# Patient Record
Sex: Male | Born: 1970
Health system: Southern US, Community
[De-identification: ages and names within clinical notes are randomized; demographics above are authoritative.]

## PROBLEM LIST (undated history)

## (undated) DIAGNOSIS — D649 Anemia, unspecified: Secondary | ICD-10-CM

## (undated) DIAGNOSIS — Z87442 Personal history of urinary calculi: Secondary | ICD-10-CM

## (undated) DIAGNOSIS — G4730 Sleep apnea, unspecified: Secondary | ICD-10-CM

## (undated) DIAGNOSIS — N189 Chronic kidney disease, unspecified: Secondary | ICD-10-CM

## (undated) DIAGNOSIS — F419 Anxiety disorder, unspecified: Secondary | ICD-10-CM

## (undated) DIAGNOSIS — K219 Gastro-esophageal reflux disease without esophagitis: Secondary | ICD-10-CM

## (undated) DIAGNOSIS — I10 Essential (primary) hypertension: Secondary | ICD-10-CM

## (undated) DIAGNOSIS — C801 Malignant (primary) neoplasm, unspecified: Secondary | ICD-10-CM

## (undated) DIAGNOSIS — E785 Hyperlipidemia, unspecified: Secondary | ICD-10-CM

## (undated) HISTORY — DX: Essential (primary) hypertension: I10

## (undated) HISTORY — DX: Hyperlipidemia, unspecified: E78.5

## (undated) HISTORY — PX: NO PAST SURGERIES: SHX2092

## (undated) HISTORY — DX: Anemia, unspecified: D64.9

## (undated) HISTORY — PX: WISDOM TOOTH EXTRACTION: SHX21

---

## 2009-08-15 ENCOUNTER — Ambulatory Visit: Payer: Self-pay | Admitting: Family Medicine

## 2009-08-16 ENCOUNTER — Telehealth (INDEPENDENT_AMBULATORY_CARE_PROVIDER_SITE_OTHER): Payer: Self-pay | Admitting: *Deleted

## 2009-08-16 LAB — CONVERTED CEMR LAB
AST: 28 units/L (ref 0–37)
Albumin: 4.6 g/dL (ref 3.5–5.2)
BUN: 14 mg/dL (ref 6–23)
Basophils Absolute: 0 10*3/uL (ref 0.0–0.1)
Basophils Relative: 0.4 % (ref 0.0–3.0)
Calcium: 9.4 mg/dL (ref 8.4–10.5)
Chloride: 107 meq/L (ref 96–112)
Direct LDL: 198.2 mg/dL
Eosinophils Absolute: 0.1 10*3/uL (ref 0.0–0.7)
Eosinophils Relative: 1.6 % (ref 0.0–5.0)
GFR calc non Af Amer: 105.09 mL/min (ref >60)
MCHC: 34.6 g/dL (ref 30.0–36.0)
MCV: 85 fL (ref 78.0–100.0)
Monocytes Absolute: 0.6 10*3/uL (ref 0.1–1.0)
Monocytes Relative: 8.3 % (ref 3.0–12.0)
Neutro Abs: 3.8 10*3/uL (ref 1.4–7.7)
Sodium: 143 meq/L (ref 135–145)
VLDL: 35.6 mg/dL (ref 0.0–40.0)

## 2009-08-26 ENCOUNTER — Ambulatory Visit: Payer: Self-pay | Admitting: Internal Medicine

## 2009-08-26 DIAGNOSIS — D239 Other benign neoplasm of skin, unspecified: Secondary | ICD-10-CM | POA: Insufficient documentation

## 2009-08-26 DIAGNOSIS — R079 Chest pain, unspecified: Secondary | ICD-10-CM | POA: Insufficient documentation

## 2009-08-26 DIAGNOSIS — E785 Hyperlipidemia, unspecified: Secondary | ICD-10-CM | POA: Insufficient documentation

## 2009-08-26 LAB — CONVERTED CEMR LAB
Bilirubin Urine: NEGATIVE
Specific Gravity, Urine: <1.005
WBC Urine, dipstick: NEGATIVE
pH: 7.5

## 2009-09-02 ENCOUNTER — Telehealth: Payer: Self-pay | Admitting: Internal Medicine

## 2009-10-03 ENCOUNTER — Ambulatory Visit: Payer: Self-pay | Admitting: Family Medicine

## 2010-03-25 ENCOUNTER — Encounter (INDEPENDENT_AMBULATORY_CARE_PROVIDER_SITE_OTHER): Payer: Self-pay | Admitting: *Deleted

## 2010-04-11 ENCOUNTER — Telehealth: Payer: Self-pay | Admitting: Internal Medicine

## 2010-04-11 ENCOUNTER — Ambulatory Visit
Admission: RE | Admit: 2010-04-11 | Discharge: 2010-04-11 | Payer: Self-pay | Source: Home / Self Care | Attending: Internal Medicine | Admitting: Internal Medicine

## 2010-04-11 ENCOUNTER — Encounter (INDEPENDENT_AMBULATORY_CARE_PROVIDER_SITE_OTHER): Payer: Self-pay | Admitting: *Deleted

## 2010-04-15 NOTE — Assessment & Plan Note (Signed)
Summary: pain under left rib/difficult to breathe//kn   Vital Signs:  Patient profile:   40 year old male Height:      68.50 inches Weight:      196 pounds O2 Sat:      98 % on Room air Temp:     98.8 degrees F oral Pulse rate:   67 / minute BP sitting:   110 / 68  (left arm)  Vitals Entered By: Jeremy Johann CMA (August 26, 2009 11:23 AM)  O2 Flow:  Room air CC: sharp stabbing pain left arm and side Comments --difficulty breathing x 1day REVIEWED MED LIST, PATIENT AGREED DOSE AND INSTRUCTION CORRECT    History of Present Illness: sudden onset of left-sided chest pain, located in the lateral, distal left  chest. The pain is dull but very sharp if he tries to take a deep breath Has not taken any medication for these Denies any injury except for the fact that he painted some walls during the weekend. he did start taking Crestor few  days ago.  Incidentally, I noted some dark moles in his back, he never has seen dermatology   ROS No fever or rash anywhere No cough, no shortness of breath per se No abdominal pain or GERD-type symptoms No recent airplane trip (last was 3 weeks ago); no swelling or pain in his legs No myalgias  Allergies (verified): No Known Drug Allergies  Past History:  Past Medical History: none Hyperlipidemia  Past Surgical History: Reviewed history from 08/15/2009 and no changes required. none  Social History: married twin girls Producer, television/film/video tobacco-- quit 2009  Review of Systems      See HPI Derm:  denies any rash in the back.  Physical Exam  General:  alert, well-developed, and well-nourished.  no apparent distress Chest Wall:  slightly tender at the distal left rib cage. No crepitus anywhere in his chest Lungs:  clear to auscultation bilaterally Heart:  her current and rhythm without murmur Abdomen:  soft, non-tender, no distention, no masses, no guarding, no rigidity, no hepatomegaly, and no splenomegaly.     Extremities:  no edema, calves symmetric and nontender Skin:  few dark moles in his back   Impression & Recommendations:  Problem # 1:  RIB PAIN, LEFT SIDED (ICD-786.50) Assessment New Pleuritic pain in the left side. DDX is large but includes pleurisy, chest wall sprain, early shingles, versus other  more serious (but less likely) diagnoses Plan: Chest x-ray Ibuprofen Patient will call if not better, or if symptoms appear. Will need further workup. Chest CT? Will also consider discontinue Crestor Orders: UA Dipstick w/o Micro (manual) ([address]) T-2 View CXR (71020TC)  Problem # 2:  MOLE (ICD-216.9) Assessment: New recommend to seea dermatologist, likes to make his own appointment  Complete Medication List: 1)  Crestor 20 Mg Tabs (Rosuvastatin calcium) .... Take one tablet at bedtime  Patient Instructions: 1)  call if not better in a few days, call anytime if worse  2)  please make an appointment to see a dermatologist, Dr. Terri Piedra [phone] 3)  Chest x-ray 4)  Take  600 mg of Ibuprofen (Advil, Motrin) with food every  6 hours as needed  for relief of pain ; watch for stomach irritation  Laboratory Results   Urine Tests   Date/Time Reported: August 26, 2009 11:40 AM  Routine Urinalysis   Color: yellow Appearance: Clear Glucose: negative   (Normal Range: Negative) Bilirubin: negative   (Normal Range: Negative) Ketone: negative   (  Normal Range: Negative) Spec. Gravity: <1.005   (Normal Range: 1.003-1.035) Blood: negative   (Normal Range: Negative) pH: 7.5   (Normal Range: 5.0-8.0) Protein: negative   (Normal Range: Negative) Urobilinogen: 0.2   (Normal Range: 0-1) Nitrite: negative   (Normal Range: Negative) Leukocyte Esterace: negative   (Normal Range: Negative)

## 2010-04-15 NOTE — Assessment & Plan Note (Signed)
Summary: possible strep//kn   Vital Signs:  Patient profile:   40 year old male Height:      68.50 inches (173.99 cm) Weight:      196.13 pounds (89.15 kg) BMI:     29.49 O2 Sat:      98 % on Room air Temp:     99.0 degrees F (37.22 degrees C) oral Pulse rate:   85 / minute BP sitting:   132 / 92  (right arm) Cuff size:   regular  Vitals Entered By: Lucious Groves CMA (October 03, 2009 11:58 AM)  O2 Flow:  Room air CC: Possible strep-Sore throat x4 days./kb Is Patient Diabetic? No Pain Assessment Patient in pain? no      Comments Patient notes that his throat is sore to the touch and feels like "a ping pong ball is stuck" when he swallows. Pt has had fever, body aches, and chils./kb   History of Present Illness: 40 yo man here today for possible strep.  wife recently had strep.  sxs started 3-4 days ago.  + fevers, chills, body aches.  + sore throat.  Current Medications (verified): 1)  Crestor 20 Mg Tabs (Rosuvastatin Calcium) .... Take One Tablet At Bedtime  Allergies (verified): No Known Drug Allergies  Review of Systems      See HPI  Physical Exam  General:  alert, well-developed, and well-nourished.  no apparent distress Nose:  External nasal examination shows no deformity or inflammation. Nasal mucosa are pink and moist without lesions or exudates. Mouth:  pharyngeal erythema and pharyngeal exudate.   Neck:  No deformities, masses, or tenderness noted.   Impression & Recommendations:  Problem # 1:  PHARYNGITIS-ACUTE (ICD-462) Assessment New  rapid strep +, start amox.  reviewed supportive care and red flags that should prompt return.  Pt expresses understanding and is in agreement w/ this plan. His updated medication list for this problem includes:    Amoxicillin 500 Mg Tabs (Amoxicillin) .Marland Kitchen... 1 two times a day x10 days  Orders: Rapid Strep ([address])  Complete Medication List: 1)  Crestor 20 Mg Tabs (Rosuvastatin calcium) .... Take one tablet at bedtime 2)   Amoxicillin 500 Mg Tabs (Amoxicillin) .Marland Kitchen.. 1 two times a day x10 days  Patient Instructions: 1)  You have strep 2)  Take the Amoxicillin as directed- take w/ food to avoid upset stomach 3)  Ibuprofen as needed for pain or fever 4)  Hang in there! Prescriptions: AMOXICILLIN 500 MG TABS (AMOXICILLIN) 1 two times a day x10 days  #20 x 0   Entered and Authorized by:   Neena Rhymes MD   Signed by:   Neena Rhymes MD on 10/03/2009   Method used:   Electronically to        [id number] (ret[facility]       [address]       [place], [place]  [address]       Ph: [phone] or [phone]       Fax: [phone]   RxID:   [id number]

## 2010-04-15 NOTE — Progress Notes (Signed)
Summary: labs  Phone Note Outgoing Call   Call placed by: Doristine Devoid,  August 16, 2009 2:25 PM Call placed to: Patient Summary of Call: pt's total cholesterol and LDL are very high.  needs to start Crestor 20mg  at bedtime, focus on diet and exercise, and recheck LFTs in 6-8 weeks.   Follow-up for Phone Call        left msg on machine .............Marland KitchenDoristine Devoid  August 16, 2009 2:25 PM   spoke w/ patient aware of labs and start of medication mailed copy of labs .......Marland KitchenDoristine Devoid  August 16, 2009 4:20 PM     New/Updated Medications: CRESTOR 20 MG TABS (ROSUVASTATIN CALCIUM) take one tablet at bedtime Prescriptions: CRESTOR 20 MG TABS (ROSUVASTATIN CALCIUM) take one tablet at bedtime  #30 x 3   Entered by:   Doristine Devoid   Authorized by:   Neena Rhymes MD   Signed by:   Doristine Devoid on 08/16/2009   Method used:   Electronically to        [id number] (ret[facility]       [address]       [place], [place]  [address]       Ph: [phone] or [phone]       Fax: [phone]   RxID:   [id number]

## 2010-04-15 NOTE — Assessment & Plan Note (Signed)
Summary: NEW TO EST/CBS   Vital Signs:  Patient profile:   40 year old male Height:      68.50 inches Weight:      196 pounds BMI:     29.47 Pulse rate:   78 / minute BP sitting:   114 / 80  (left arm)  Vitals Entered By: Doristine Devoid (August 15, 2009 10:32 AM) CC: NEW EST- CPX AND LABS   History of Present Illness: 40 yo man here today to establish care.  Previous MD- Kindl.  no concerns.    Preventive Screening-Counseling & Management  Alcohol-Tobacco     Alcohol drinks/day: <1     Smoking Status: quit     Year Quit: 2008  Caffeine-Diet-Exercise     Does Patient Exercise: no      Sexual History:  currently monogamous.        Drug Use:  never.    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    Physical Exam  General:  Well-developed,well-nourished,in no acute distress; alert,appropriate and cooperative throughout examination Head:  Normocephalic and atraumatic without obvious abnormalities. No apparent alopecia or balding. Eyes:  No corneal or conjunctival inflammation noted. EOMI. Perrla. Funduscopic exam benign, without hemorrhages, exudates or papilledema. Vision  grossly normal. Ears:  External ear exam shows no significant lesions or deformities.  Otoscopic examination reveals clear canals, tympanic membranes are intact bilaterally without bulging, retraction, inflammation or discharge. Hearing is grossly normal bilaterally. Nose:  External nasal examination shows no deformity or inflammation. Nasal mucosa are pink and moist without lesions or exudates. Mouth:  Oral mucosa and oropharynx without lesions or exudates.  Teeth in good repair. Neck:  No deformities, masses, or tenderness noted. Lungs:  Normal respiratory effort, chest expands symmetrically. Lungs are clear to auscultation, no crackles or wheezes. Heart:  Normal rate and regular rhythm. S1 and S2 normal without gallop, murmur, click, rub or other extra sounds. Abdomen:  Bowel sounds positive,abdomen soft and non-tender without masses, organomegaly or hernias noted. Genitalia:  Testes bilaterally descended without nodularity, tenderness or masses. No scrotal masses or lesions. No penis lesions or urethral discharge. Msk:  No deformity or scoliosis noted of thoracic or lumbar spine.   Pulses:  +2 carotid, radial, DP Extremities:  No clubbing, cyanosis, edema, or deformity noted with normal full range of motion of all joints.   Neurologic:  No cranial nerve deficits noted. Station and gait are normal. Plantar reflexes are down-going bilaterally. DTRs are symmetrical throughout. Sensory, motor and coordinative functions appear intact. Skin:  Intact without suspicious lesions or rashes Cervical Nodes:  No lymphadenopathy noted Inguinal Nodes:  No significant adenopathy Psych:  Cognition and judgment appear intact. Alert and cooperative with normal attention span and concentration. No apparent delusions, illusions, hallucinations   Impression & Recommendations:  Problem # 1:  PHYSICAL EXAMINATION (ICD-V70.0) Assessment New PE  WNL.  labs collected.  anticipatory guidance  provided. Orders: Venipuncture ([address]) TLB-Lipid Panel (80061-LIPID) TLB-BMP (Basic Metabolic Panel-BMET) (80048-METABOL) TLB-CBC Platelet - w/Differential (85025-CBCD) TLB-Hepatic/Liver Function Pnl (80076-HEPATIC) TLB-TSH (Thyroid Stimulating Hormone) ([address]-UJW)  Patient Instructions: 1)  Please schedule a follow-up appointment in 1 year or as needed. 2)  Your exam looks great! 3)  Try and get some regular exercise 4)  We'll notify you of your lab work 5)  Call with any questions or concerns 6)  Welcome!  We're glad to have you!

## 2010-04-15 NOTE — Progress Notes (Signed)
Summary: Rosita Fire pt status  ---- Converted from flag ---- ---- 08/26/2009 4:41 PM, Jose E. Paz MD wrote: please check on him, better? ------------------------------  LEF MESSAGE TO CALL OFFICE.Felecia Deloach CMA  September 02, 2009 8:27 AM   PT STATES THAT HE IS DOING MUCH BETTER NOW THE PAIN SUBSIDED AS THE WEEK PROGRESSED. Felecia Deloach CMA  September 03, 2009 10:54 AM

## 2010-04-17 NOTE — Letter (Signed)
Summary: Pre Visit Letter Revised  [facility]  [address] [place], [place] [address]   Phone: [phone]  Fax: [phone]        03/25/2010 MRN: [id number] [facility] [address] [place], [place]  [address]             Procedure Date:  04-25-10   Welcome to the Gastroenterology Division at [facility].    You are scheduled to see a nurse for your pre-procedure visit on 04-11-09 at 8:00a.m. on the 3rd floor at [facility], [address]  We ask that you try to arrive at our office 15 minutes prior to your appointment time to allow for check-in.  Please take a minute to review the attached form.  If you answer "Yes" to one or more of the questions on the first page, we ask that you call the person listed at your earliest opportunity.  If you answer "No" to all of the questions, please complete the rest of the form and bring it to your appointment.    Your nurse visit will consist of discussing your medical and surgical history, your immediate family medical history, and your medications.   If you are unable to list all of your medications on the form, please bring the medication bottles to your appointment and we will list them.  We will need to be aware of both prescribed and over the counter drugs.  We will need to know exact dosage information as well.    Please be prepared to read and sign documents such as consent forms, a financial agreement, and acknowledgement forms.  If necessary, and with your consent, a friend or relative is welcome to sit-in on the nurse visit with you.  Please bring your insurance card so that we may make a copy of it.  If your insurance requires a referral to see a specialist, please bring your referral form from your primary care physician.  No co-pay is required for this nurse visit.     If you cannot keep your appointment, please call [phone] to cancel or reschedule prior to your appointment date.  This allows [place]  the opportunity to schedule an appointment for another patient in need of care.    Thank you for choosing [facility] for your medical needs.  We appreciate the opportunity to care for you.  Please visit [place] at our website  to learn more about our practice.  Sincerely, The Gastroenterology Division

## 2010-04-17 NOTE — Letter (Signed)
Summary: [facility] Instructions  [facility]  [address] [place], [place] [address]   Phone: [phone]  Fax: [phone]       Dakota Reid    1970/09/22    MRN: [id number]        Procedure Day /Date: Friday 04-25-10     Arrival Time: 10:30 am      Procedure Time: 11:30 am     Location of Procedure:                    _x _   [facility] (4th Floor)                      PREPARATION FOR COLONOSCOPY WITH MOVIPREP   Starting 5 days prior to your procedure  04-20-10  do not eat nuts, seeds, popcorn, corn, beans, peas,  salads, or any raw vegetables.  Do not take any fiber supplements (e.g. Metamucil, Citrucel, and Benefiber).  THE DAY BEFORE YOUR PROCEDURE         DATE: 04-24-10  DAY: Thursday   1.  Drink clear liquids the entire day-NO SOLID FOOD  2.  Do not drink anything colored red or purple.  Avoid juices with pulp.  No orange juice.  3.  Drink at least 64 oz. (8 glasses) of fluid/clear liquids during the day to prevent dehydration and help the prep work efficiently.  CLEAR LIQUIDS INCLUDE: Water Jello Ice Popsicles Tea (sugar ok, no milk/cream) Powdered fruit flavored drinks Coffee (sugar ok, no milk/cream) Gatorade Juice: apple, white grape, white cranberry  Lemonade Clear bullion, consomm, broth Carbonated beverages (any kind) Strained chicken noodle soup Hard Candy                             4.  In the morning, mix first dose of MoviPrep solution:    Empty 1 Pouch A and 1 Pouch B into the disposable container    Add lukewarm drinking water to the top line of the container. Mix to dissolve    Refrigerate (mixed solution should be used within 24 hrs)  5.  Begin drinking the prep at 5:00 p.m. The MoviPrep container is divided by 4 marks.   Every 15 minutes drink the solution down to the next mark (approximately 8 oz) until the full liter is complete.   6.  Follow completed prep with 16 oz of clear liquid of your choice  (Nothing red or purple).  Continue to drink clear liquids until bedtime.  7.  Before going to bed, mix second dose of MoviPrep solution:    Empty 1 Pouch A and 1 Pouch B into the disposable container    Add lukewarm drinking water to the top line of the container. Mix to dissolve    Refrigerate  THE DAY OF YOUR PROCEDURE      DATE:  04-25-10  DAY:  Friday  Beginning at  6:30 a.m. (5 hours before procedure):         1. Every 15 minutes, drink the solution down to the next mark (approx 8 oz) until the full liter is complete.  2. Follow completed prep with 16 oz. of clear liquid of your choice.    3. You may drink clear liquids until  9:30 a.m.  (2 HOURS BEFORE PROCEDURE).   MEDICATION INSTRUCTIONS  Unless otherwise instructed, you should take regular prescription medications with a small sip of water  as early as possible the morning of your procedure.         OTHER INSTRUCTIONS  You will need a responsible adult at least 38 years of age to accompany you and drive you home.   This person must remain in the waiting room during your procedure.  Wear loose fitting clothing that is easily removed.  Leave jewelry and other valuables at home.  However, you may wish to bring a book to read or  an iPod/MP3 player to listen to music as you wait for your procedure to start.  Remove all body piercing jewelry and leave at home.  Total time from sign-in until discharge is approximately 2-3 hours.  You should go home directly after your procedure and rest.  You can resume normal activities the  day after your procedure.  The day of your procedure you should not:   Drive   Make legal decisions   Operate machinery   Drink alcohol   Return to work  You will receive specific instructions about eating, activities and medications before you leave.    The above instructions have been reviewed and explained to me by   Sherren Kerns RN  April 11, 2010 8:18 AM    I fully  understand and can verbalize these instructions _____________________________ Date _________

## 2010-04-17 NOTE — Progress Notes (Signed)
Summary: ? need colonoscopy  Phone Note Call from Patient   Summary of Call: Patient  direct today for previsit.  He wants colonoscopy because of family history. He states that Dr.Tabori did want colonoscopy also because of family hx.  Maternal grandmother died of colon cancer age 38's and his mother died 1 week ago from cholangiocarcinoma age 12.  Is patient okay to have screening colonoscopy? please advise Initial call taken by: Sherren Kerns RN,  April 11, 2010 8:43 AM  Follow-up for Phone Call        Assuming that he is asymptomatic and there are no relevant clinical issues, and based on what you've told me, he would not be due for routine screening colonoscopy until age 4 (30 if African American). Follow-up by: Hilarie Fredrickson MD,  April 11, 2010 1:00 PM  Additional Follow-up for Phone Call Additional follow up Details #1::        Spoke with patient to let him know what Dr.Masyn Rostro advised. He denies any GI concerns/symptoms. He wanted to think about this over the weekend and talk with wife. Explained to patient to call me back monday if he wanted to make office visit. He understands.  Additional Follow-up by: Sherren Kerns RN,  April 11, 2010 5:13 PM

## 2010-04-17 NOTE — Miscellaneous (Signed)
Summary: previsit prep/rm  Clinical Lists Changes  Medications: Added new medication of MOVIPREP 100 GM  SOLR (PEG-KCL-NACL-NASULF-NA ASC-C) As per prep instructions. - Signed Rx of MOVIPREP 100 GM  SOLR (PEG-KCL-NACL-NASULF-NA ASC-C) As per prep instructions.;  #1 x 0;  Signed;  Entered by: Sherren Kerns RN;  Authorized by: Hilarie Fredrickson MD;  Method used: [id number] [address], [place], [place]  [address], Ph: [phone] or [phone], Fax: [phone] Observations: Added new observation of ALLERGY REV: Done (04/11/2010 7:58)    Prescriptions: MOVIPREP 100 GM  SOLR (PEG-KCL-NACL-NASULF-NA ASC-C) As per prep instructions.  #1 x 0   Entered by:   Sherren Kerns RN   Authorized by:   Hilarie Fredrickson MD   Signed by:   Sherren Kerns RN on 04/11/2010   Method used:   Electronically to        [id number] ([facility]       [address]       [place], [place]  [address]       Ph: [phone] or [phone]       Fax: [phone]   RxID:   [id number]

## 2010-04-25 ENCOUNTER — Other Ambulatory Visit: Payer: Self-pay | Admitting: Internal Medicine

## 2010-08-24 ENCOUNTER — Other Ambulatory Visit: Payer: Self-pay | Admitting: Family Medicine

## 2010-08-25 NOTE — Telephone Encounter (Signed)
Refill sent, due for CPX.

## 2010-09-11 ENCOUNTER — Encounter: Payer: Self-pay | Admitting: Family Medicine

## 2010-09-15 ENCOUNTER — Ambulatory Visit (INDEPENDENT_AMBULATORY_CARE_PROVIDER_SITE_OTHER): Payer: 59 | Admitting: Family Medicine

## 2010-09-15 ENCOUNTER — Encounter: Payer: Self-pay | Admitting: Family Medicine

## 2010-09-15 DIAGNOSIS — Z23 Encounter for immunization: Secondary | ICD-10-CM

## 2010-09-15 DIAGNOSIS — E785 Hyperlipidemia, unspecified: Secondary | ICD-10-CM

## 2010-09-15 DIAGNOSIS — Z0000 Encounter for general adult medical examination without abnormal findings: Secondary | ICD-10-CM | POA: Insufficient documentation

## 2010-09-15 LAB — LIPID PANEL
Cholesterol: 154 mg/dL (ref 0–200)
HDL: 42.8 mg/dL (ref >39.00)
Total CHOL/HDL Ratio: 4

## 2010-09-15 LAB — CBC WITH DIFFERENTIAL/PLATELET
Basophils Absolute: 0 10*3/uL (ref 0.0–0.1)
Basophils Relative: 0.5 % (ref 0.0–3.0)
Eosinophils Absolute: 0.1 10*3/uL (ref 0.0–0.7)
Hemoglobin: 15.5 g/dL (ref 13.0–17.0)
MCHC: 34.3 g/dL (ref 30.0–36.0)
Monocytes Absolute: 0.7 10*3/uL (ref 0.1–1.0)
RBC: 5.31 Mil/uL (ref 4.22–5.81)

## 2010-09-15 LAB — HEPATIC FUNCTION PANEL
ALT: 72 U/L — ABNORMAL HIGH (ref 0–53)
AST: 42 U/L — ABNORMAL HIGH (ref 0–37)
Albumin: 4.8 g/dL (ref 3.5–5.2)
Alkaline Phosphatase: 65 U/L (ref 39–117)
Total Bilirubin: 0.9 mg/dL (ref 0.3–1.2)

## 2010-09-15 LAB — LDL CHOLESTEROL, DIRECT: Direct LDL: 90.3 mg/dL

## 2010-09-15 LAB — BASIC METABOLIC PANEL
CO2: 30 mEq/L (ref 19–32)
Glucose, Bld: 112 mg/dL — ABNORMAL HIGH (ref 70–99)
Potassium: 4.1 mEq/L (ref 3.5–5.1)
Sodium: 140 mEq/L (ref 135–145)

## 2010-09-15 LAB — TSH: TSH: 1.27 u[IU]/mL (ref 0.35–5.50)

## 2010-09-15 NOTE — Progress Notes (Signed)
  Subjective:    Patient ID: Dakota Reid, male    DOB: Feb 25, 1971, 40 y.o.   MRN: [id number]  HPI CPE- no concerns today.  Tolerating Crestor w/out difficulty- no abd pain, N/V, myalgias.   Review of Systems Patient reports no vision/hearing changes, anorexia, fever ,adenopathy, persistant/recurrent hoarseness, swallowing issues, chest pain, palpitations, edema, persistant/recurrent cough, hemoptysis, dyspnea (rest,exertional, paroxysmal nocturnal), gastrointestinal  bleeding (melena, rectal bleeding), abdominal pain, excessive heart burn, GU symptoms (dysuria, hematuria, voiding/incontinence issues) syncope, focal weakness, memory loss, numbness & tingling, skin/hair/nail changes, depression, anxiety, abnormal bruising/bleeding, musculoskeletal symptoms/signs.     Objective:   Physical Exam BP 120/88  Temp(Src) 98.6 F (37 C) (Oral)  Ht 5\' 7"  (1.702 m)  Wt 205 lb 9.6 oz (93.26 kg)  BMI 32.20 kg/m2  General Appearance:    Alert, cooperative, no distress, appears stated age, overweight  Head:    Normocephalic, without obvious abnormality, atraumatic  Eyes:    PERRL, conjunctiva/corneas clear, EOM's intact, fundi    benign, both eyes       Ears:    Normal TM's and external ear canals, both ears  Nose:   Nares normal, septum midline, mucosa normal, no drainage   or sinus tenderness  Throat:   Lips, mucosa, and tongue normal; teeth and gums normal  Neck:   Supple, symmetrical, trachea midline, no adenopathy;       thyroid:  No enlargement/tenderness/nodules  Back:     Symmetric, no curvature, ROM normal, no CVA tenderness  Lungs:     Clear to auscultation bilaterally, respirations unlabored  Chest wall:    No tenderness or deformity  Heart:    Regular rate and rhythm, S1 and S2 normal, no murmur, rub   or gallop  Abdomen:     Soft, non-tender, bowel sounds active all four quadrants,    no masses, no organomegaly  Genitalia:    Normal male without lesion, discharge or tenderness    Rectal:    Deferred due to age  Extremities:   Extremities normal, atraumatic, no cyanosis or edema  Pulses:   2+ and symmetric all extremities  Skin:   Skin color, texture, turgor normal, no rashes or lesions  Lymph nodes:   Cervical, supraclavicular, and axillary nodes normal  Neurologic:   CNII-XII intact. Normal strength, sensation and reflexes      throughout          Assessment & Plan:

## 2010-09-15 NOTE — Patient Instructions (Signed)
Follow up in 6 months to recheck cholesterol Try and get regular exercise and make healthy food choices We'll notify you of your lab results Call with any questions or concerns Have a great summer!

## 2010-09-18 ENCOUNTER — Encounter: Payer: Self-pay | Admitting: *Deleted

## 2010-09-18 MED ORDER — FENOFIBRATE 160 MG PO TABS: 160.0000 mg | ORAL_TABLET | Freq: Every day | ORAL | Status: DC

## 2010-09-22 NOTE — Assessment & Plan Note (Signed)
Tolerating meds w/out difficulty.  Check labs.  Adjust prn.

## 2010-09-22 NOTE — Assessment & Plan Note (Signed)
PE WNL w/ exception of weight.  Encouraged healthy diet and regular exercise.  Check labs.  Anticipatory guidance provided.

## 2010-11-14 ENCOUNTER — Other Ambulatory Visit: Payer: Self-pay | Admitting: Family Medicine

## 2010-11-14 MED ORDER — ROSUVASTATIN CALCIUM 20 MG PO TABS: 20.0000 mg | ORAL_TABLET | Freq: Every day | ORAL | Status: DC

## 2010-11-14 NOTE — Telephone Encounter (Signed)
done

## 2011-11-25 ENCOUNTER — Ambulatory Visit (INDEPENDENT_AMBULATORY_CARE_PROVIDER_SITE_OTHER): Payer: 59 | Admitting: Internal Medicine

## 2011-11-25 ENCOUNTER — Encounter: Payer: Self-pay | Admitting: Internal Medicine

## 2011-11-25 VITALS — BP 122/84 | HR 83 | Temp 98.4°F | Wt 206.0 lb

## 2011-11-25 DIAGNOSIS — J069 Acute upper respiratory infection, unspecified: Secondary | ICD-10-CM

## 2011-11-25 MED ORDER — AZELASTINE HCL 0.1 % NA SOLN: 2.0000 | Freq: Two times a day (BID) | NASAL | Status: DC

## 2011-11-25 MED ORDER — AZITHROMYCIN 250 MG PO TABS: ORAL_TABLET | ORAL | Status: AC

## 2011-11-25 NOTE — Progress Notes (Signed)
  Subjective:    Patient ID: Dakota Reid, male    DOB: 18-Sep-1970, 41 y.o.   MRN: [id number]  HPI Acute visit Sick for one week: Sinus congestion, postnasal dripping, sore throat, cough.  Past Medical History  Diagnosis Date  . Hyperlipidemia     Review of Systems He does have on and off subjective fever but no chills. Occasional sputum production, mild cough. Denies headaches or muscle aches. No nausea vomiting, had loose stools a few times this week.     Objective:   Physical Exam  General -- alert, well-developed . No apparent distress.  HEENT -- TMs normal, throat w/o redness, face symmetric and not tender to palpation, nose quite congested   Lungs -- normal respiratory effort, no intercostal retractions, no accessory muscle use, and few rhonchi with cough. No wheezing. Heart-- normal rate, regular rhythm, no murmur, and no gallop.   Psych-- Cognition and judgment appear intact. Alert and cooperative with normal attention span and concentration.  not anxious appearing and not depressed appearing.        Assessment & Plan:   URI, mild bronchitis. See  instructions

## 2011-11-25 NOTE — Patient Instructions (Addendum)
Rest, fluids , tylenol For cough, take Mucinex DM twice a day as needed  For congestion use astelin nasal spray twice a day until you feel better Take the antibiotic as prescribed  (zithromax) if you are not improving in the next few days Call anytime if the symptoms are severe or if not back to normal en 10 days

## 2011-11-26 ENCOUNTER — Encounter: Payer: Self-pay | Admitting: Internal Medicine

## 2012-03-03 ENCOUNTER — Encounter: Payer: Self-pay | Admitting: Family Medicine

## 2012-03-03 ENCOUNTER — Ambulatory Visit (INDEPENDENT_AMBULATORY_CARE_PROVIDER_SITE_OTHER): Payer: 59 | Admitting: Family Medicine

## 2012-03-03 VITALS — BP 140/90 | HR 78 | Temp 98.0°F | Ht 67.5 in | Wt 208.2 lb

## 2012-03-03 DIAGNOSIS — M6240 Contracture of muscle, unspecified site: Secondary | ICD-10-CM | POA: Insufficient documentation

## 2012-03-03 DIAGNOSIS — G44209 Tension-type headache, unspecified, not intractable: Secondary | ICD-10-CM

## 2012-03-03 DIAGNOSIS — M62838 Other muscle spasm: Secondary | ICD-10-CM

## 2012-03-03 MED ORDER — CYCLOBENZAPRINE HCL 10 MG PO TABS
10.0000 mg | ORAL_TABLET | Freq: Three times a day (TID) | ORAL | Status: DC | PRN
Start: 2012-03-03 — End: 2013-05-26

## 2012-03-03 NOTE — Patient Instructions (Addendum)
We'll call you with your neuro appt Take the Flexeril as needed prior to sleep for tension type headaches Ibuprofen or aleve as needed Call with any questions or concerns Hang in there! CONGRATS! Happy Holidays!!!!

## 2012-03-03 NOTE — Progress Notes (Signed)
  Subjective:    Patient ID: Dakota Reid, male    DOB: 15-Apr-1970, 41 y.o.   MRN: [id number]  HPI Involuntary motor movements- pt reports clicking in his throat, face is pulling to the L side.  Is doing this in his sleep.  Head will bob, drift to L side.  Clicks more w/ eating.  Has been present for years but has been trying to ignore it.  Now having HAs at the end of the day from trying to keep movements in check.  HAs will wake him from sleep. Typically occipital.  No involuntary speaking.  Denies excessive stressors.   Review of Systems For ROS see HPI     Objective:   Physical Exam  Vitals reviewed. Constitutional: He is oriented to person, place, and time. He appears well-developed and well-nourished. No distress.  HENT:  Head: Normocephalic and atraumatic.       TMs WNL  Eyes: Conjunctivae normal and EOM are normal. Pupils are equal, round, and reactive to light.  Neck: Normal range of motion. Neck supple. No thyromegaly present.  Cardiovascular: Normal rate, regular rhythm and normal heart sounds.   Pulmonary/Chest: Effort normal and breath sounds normal. No respiratory distress. He has no wheezes. He has no rales.  Lymphadenopathy:    He has no cervical adenopathy.  Neurological: He is alert and oriented to person, place, and time. He has normal reflexes. No cranial nerve deficit.       Pt w/ nearly continuous involuntary vocalization- when attempting to suppress sound, will have facial tic and head bobbing to L  Skin: Skin is warm and dry.  Psychiatric: He has a normal mood and affect. His behavior is normal. Thought content normal.          Assessment & Plan:

## 2012-03-06 NOTE — Assessment & Plan Note (Signed)
New.  Pt's occipital HAs are most consistent w/ tension HA.  Start NSAIDs, muscle relaxer prn.  Reviewed supportive care and red flags that should prompt return.  Pt expressed understanding and is in agreement w/ plan.

## 2012-03-06 NOTE — Assessment & Plan Note (Signed)
New.  Pt having involuntary facial tics and vocalizations.  Will refer to neuro for complete evaluation and tx.  Pt expressed understanding and is in agreement w/ plan.

## 2012-07-13 ENCOUNTER — Encounter: Payer: Self-pay | Admitting: Family Medicine

## 2012-07-13 ENCOUNTER — Ambulatory Visit (INDEPENDENT_AMBULATORY_CARE_PROVIDER_SITE_OTHER): Payer: 59 | Admitting: Family Medicine

## 2012-07-13 ENCOUNTER — Telehealth: Payer: Self-pay | Admitting: General Practice

## 2012-07-13 VITALS — BP 132/100 | HR 94 | Temp 98.1°F | Ht 67.75 in | Wt 211.4 lb

## 2012-07-13 DIAGNOSIS — R079 Chest pain, unspecified: Secondary | ICD-10-CM | POA: Insufficient documentation

## 2012-07-13 NOTE — Progress Notes (Signed)
  Subjective:    Patient ID: Dakota Reid, male    DOB: 1970-12-18, 42 y.o.   MRN: [id number]  HPI CP- occurred on Monday ~5:30.  sxs lasted 30-45 minutes.  sxs were mid chest to L side and radiating to shoulder.  Pain described as a 'pressure- a squeezing'.  Pain rated 8/10.  No SOB.  Resolved spontaneously.  No additional sxs.  No nausea.  No diaphoresis or light headedness.  Also radiated into neck.  Did not feel similar to other GERD episodes.  No hx of similar.  Did not feel anxious at time of sxs.  MGF had MI in 49s.  Father w/ HTN.  PGM w/ CAD.  Pt's BP is elevated today- wife reports hx of similar at Neuro.   Review of Systems For ROS see HPI     Objective:   Physical Exam  Vitals reviewed. Constitutional: He is oriented to person, place, and time. He appears well-developed and well-nourished. No distress.  HENT:  Head: Normocephalic and atraumatic.  Eyes: Conjunctivae and EOM are normal. Pupils are equal, round, and reactive to light.  Neck: Normal range of motion. Neck supple. No thyromegaly present.  Cardiovascular: Normal rate, regular rhythm, normal heart sounds and intact distal pulses.   No murmur heard. Pulmonary/Chest: Effort normal and breath sounds normal. No respiratory distress.  Abdominal: Soft. Bowel sounds are normal. He exhibits no distension.  Musculoskeletal: He exhibits no edema.  Lymphadenopathy:    He has no cervical adenopathy.  Neurological: He is alert and oriented to person, place, and time. No cranial nerve deficit.  Skin: Skin is warm and dry.  Psychiatric: He has a normal mood and affect. His behavior is normal.          Assessment & Plan:

## 2012-07-13 NOTE — Assessment & Plan Note (Signed)
New.  Pt has hx of hyperlipidemia but not currently taking statin.  Now w/ elevated BP.  CP concerning b/c it has some typical features- pressure, radiation to neck and L arm.  Atypical features include no association w/ exertion, no SOB, young age.  EKG w/out obvious abnormality.  Check troponin.  Differential dx includes esophageal spasm- start PPI.  Check labs to risk stratify and ensure normal renal fxn prior to starting BP med.  Reviewed supportive care and red flags that should prompt return.  Pt expressed understanding and is in agreement w/ plan.

## 2012-07-13 NOTE — Telephone Encounter (Signed)
noted 

## 2012-07-13 NOTE — Telephone Encounter (Signed)
Pt called stating that his Chest pain had actually taken place on Monday lasted for about 30 minutes. States he never had any SOB. Pt is currently asymptomatic and will be in the office today for his appt.

## 2012-07-13 NOTE — Patient Instructions (Signed)
We will notify you of your lab results Start OTC Omeprazole daily in case of esophageal spasm Call with any questions or concerns If this happens again, please go to the ER Hang in there!!

## 2012-07-14 LAB — LIPID PANEL
Total CHOL/HDL Ratio: 7
VLDL: 95.4 mg/dL — ABNORMAL HIGH (ref 0.0–40.0)

## 2012-07-14 LAB — BASIC METABOLIC PANEL
BUN: 17 mg/dL (ref 6–23)
CO2: 27 mEq/L (ref 19–32)
Chloride: 100 mEq/L (ref 96–112)
Potassium: 3.5 mEq/L (ref 3.5–5.1)

## 2012-07-14 LAB — HEPATIC FUNCTION PANEL
ALT: 55 U/L — ABNORMAL HIGH (ref 0–53)
AST: 34 U/L (ref 0–37)
Total Bilirubin: 0.9 mg/dL (ref 0.3–1.2)
Total Protein: 7.8 g/dL (ref 6.0–8.3)

## 2012-07-14 LAB — TSH: TSH: 0.95 u[IU]/mL (ref 0.35–5.50)

## 2012-07-14 MED ORDER — ROSUVASTATIN CALCIUM 20 MG PO TABS: 20.0000 mg | ORAL_TABLET | Freq: Every day | ORAL | Status: DC

## 2012-07-14 NOTE — Addendum Note (Signed)
Addended by: Edwena Felty T on: 07/14/2012 04:53 PM   Modules accepted: Orders

## 2012-07-31 ENCOUNTER — Other Ambulatory Visit: Payer: Self-pay | Admitting: Diagnostic Neuroimaging

## 2013-05-04 ENCOUNTER — Other Ambulatory Visit: Payer: Self-pay | Admitting: Diagnostic Neuroimaging

## 2013-05-26 ENCOUNTER — Ambulatory Visit (INDEPENDENT_AMBULATORY_CARE_PROVIDER_SITE_OTHER): Payer: 59 | Admitting: Physician Assistant

## 2013-05-26 ENCOUNTER — Encounter: Payer: Self-pay | Admitting: Physician Assistant

## 2013-05-26 VITALS — BP 150/89 | HR 103 | Temp 98.0°F | Resp 16 | Ht 67.5 in | Wt 208.1 lb

## 2013-05-26 DIAGNOSIS — J40 Bronchitis, not specified as acute or chronic: Secondary | ICD-10-CM

## 2013-05-26 MED ORDER — AZITHROMYCIN 250 MG PO TABS: ORAL_TABLET | ORAL | Status: DC

## 2013-05-26 NOTE — Progress Notes (Signed)
Pre visit review using our clinic review tool, if applicable. No additional management support is needed unless otherwise documented below in the visit note/SLS  

## 2013-05-26 NOTE — Patient Instructions (Signed)
There is a high likelihood that your symptoms are viral in nature.  Increase fluid intake.  Rest.  Saline nasal spray.  Use plain mucinex.  Take a multivitamin.  Use Delsym for cough.  Avoid medications with D or DM in the labeling. Also avoid Sudafed.  This will worsen your BP.  If symptoms do not improve within 48 hours, please get antibiotic filled and begin taking as prescribed.  Acute Bronchitis Bronchitis is inflammation of the airways that extend from the windpipe into the lungs (bronchi). The inflammation often causes mucus to develop. This leads to a cough, which is the most common symptom of bronchitis.  In acute bronchitis, the condition usually develops suddenly and goes away over time, usually in a couple weeks. Smoking, allergies, and asthma can make bronchitis worse. Repeated episodes of bronchitis may cause further lung problems.  CAUSES Acute bronchitis is most often caused by the same virus that causes a cold. The virus can spread from person to person (contagious).  SIGNS AND SYMPTOMS   Cough.   Fever.   Coughing up mucus.   Body aches.   Chest congestion.   Chills.   Shortness of breath.   Sore throat.  DIAGNOSIS  Acute bronchitis is usually diagnosed through a physical exam. Tests, such as chest X-rays, are sometimes done to rule out other conditions.  TREATMENT  Acute bronchitis usually goes away in a couple weeks. Often times, no medical treatment is necessary. Medicines are sometimes given for relief of fever or cough. Antibiotics are usually not needed but may be prescribed in certain situations. In some cases, an inhaler may be recommended to help reduce shortness of breath and control the cough. A cool mist vaporizer may also be used to help thin bronchial secretions and make it easier to clear the chest.  HOME CARE INSTRUCTIONS  Get plenty of rest.   Drink enough fluids to keep your urine clear or pale yellow (unless you have a medical condition  that requires fluid restriction). Increasing fluids may help thin your secretions and will prevent dehydration.   Only take over-the-counter or prescription medicines as directed by your health care provider.   Avoid smoking and secondhand smoke. Exposure to cigarette smoke or irritating chemicals will make bronchitis worse. If you are a smoker, consider using nicotine gum or skin patches to help control withdrawal symptoms. Quitting smoking will help your lungs heal faster.   Reduce the chances of another bout of acute bronchitis by washing your hands frequently, avoiding people with cold symptoms, and trying not to touch your hands to your mouth, nose, or eyes.   Follow up with your health care provider as directed.  SEEK MEDICAL CARE IF: Your symptoms do not improve after 1 week of treatment.  SEEK IMMEDIATE MEDICAL CARE IF:  You develop an increased fever or chills.   You have chest pain.   You have severe shortness of breath.  You have bloody sputum.   You develop dehydration.  You develop fainting.  You develop repeated vomiting.  You develop a severe headache. MAKE SURE YOU:   Understand these instructions.  Will watch your condition.  Will get help right away if you are not doing well or get worse. Document Released: 04/09/2004 Document Revised: 11/02/2012 Document Reviewed: 08/23/2012 [facility] Patient Information 2014 [place].

## 2013-05-28 DIAGNOSIS — J40 Bronchitis, not specified as acute or chronic: Secondary | ICD-10-CM | POA: Insufficient documentation

## 2013-05-28 NOTE — Assessment & Plan Note (Signed)
Discussed potential etiologies of symptoms. Given 2-3 days of symptoms, most likely viral bronchitis. Patient does endorse worsening symptoms. Increase fluids. Rest. Saline nasal spray. Plain Mucinex. Probiotic. Humidifier in bedroom. Rx for azithromycin printed and handed to patient. Patient instructed to begin taking anabiotic only if symptoms continue to worsen over the next 24 hours. Patient voices understanding.

## 2013-05-28 NOTE — Progress Notes (Signed)
Patient presents to clinic today c/o a 2-3 days of sinus pressure, headache, nasal congestion, postnasal drip and cough. Patient denies fever, chills, shortness of breath or wheezing. Denies tooth pain. Endorses achy bilateral ear pain. Patient denies recent travel.  Past Medical History  Diagnosis Date  . Hyperlipidemia     Current Outpatient Prescriptions on File Prior to Visit  Medication Sig Dispense Refill  . cloNIDine (CATAPRES) 0.1 MG tablet TAKE 1 TABLET BY MOUTH EVERY DAY  30 tablet  0  . multivitamin (ONE-A-DAY MEN'S) TABS Take 1 tablet by mouth daily.      . Omega-3 Fatty Acids (FISH OIL PO) Take by mouth daily.       No current facility-administered medications on file prior to visit.    No Known Allergies  Family History  Problem Relation Age of Onset  . Hypertension Father   . Diabetes Mother   . Prostate cancer Paternal Grandfather   . Cancer Maternal Grandmother     colon  . Cancer Mother     cholangiocarcinoma    History   Social History  . Marital Status: Married    Spouse Name: N/A    Number of Children: N/A  . Years of Education: N/A   Social History Main Topics  . Smoking status: Former Smoker    Quit date: 03/17/2007  . Smokeless tobacco: Never Used  . Alcohol Use: Yes     Comment: 1-2 beers per week  . Drug Use: No  . Sexual Activity: None   Other Topics Concern  . None   Social History Narrative   Married, twin girls.   Review of Systems - See HPI.  All other ROS are negative.  BP 150/89  Pulse 103  Temp(Src) 98 F (36.7 C) (Oral)  Resp 16  Ht 5' 7.5" (1.715 m)  Wt 208 lb 2 oz (94.405 kg)  BMI 32.10 kg/m2  SpO2 98%  Physical Exam  Vitals reviewed. Constitutional: He is oriented to person, place, and time and well-developed, well-nourished, and in no distress.  HENT:  Head: Normocephalic and atraumatic.  Right Ear: External ear normal.  Left Ear: External ear normal.  Nose: Nose normal.  Mouth/Throat: Oropharynx is clear  and moist. No oropharyngeal exudate.  Tympanic membranes within normal limits bilaterally. No tenderness to percussion of sinuses noted on exam.  Eyes: Conjunctivae are normal. Pupils are equal, round, and reactive to light.  Neck: Neck supple.  Cardiovascular: Normal rate, regular rhythm, normal heart sounds and intact distal pulses.   Pulmonary/Chest: Effort normal and breath sounds normal. No respiratory distress. He has no wheezes. He has no rales. He exhibits no tenderness.  Lymphadenopathy:    He has no cervical adenopathy.  Neurological: He is alert and oriented to person, place, and time.  Skin: Skin is warm and dry. No rash noted.  Psychiatric: Affect normal.    No results found for this or any previous visit (from the past 2160 hour(s)).  Assessment/Plan: Bronchitis Discussed potential etiologies of symptoms. Given 2-3 days of symptoms, most likely viral bronchitis. Patient does endorse worsening symptoms. Increase fluids. Rest. Saline nasal spray. Plain Mucinex. Probiotic. Humidifier in bedroom. Rx for azithromycin printed and handed to patient. Patient instructed to begin taking anabiotic only if symptoms continue to worsen over the next 24 hours. Patient voices understanding.

## 2013-06-06 ENCOUNTER — Other Ambulatory Visit: Payer: Self-pay | Admitting: Diagnostic Neuroimaging

## 2013-06-06 NOTE — Telephone Encounter (Signed)
Pt requesting refilly on cloNIDine (CATAPRES) 0.1 MG tablet.  Pt has appt scheduled for 5-7 @ 1:30 pm.  Thank you

## 2013-06-26 ENCOUNTER — Encounter: Payer: Self-pay | Admitting: Physician Assistant

## 2013-06-26 ENCOUNTER — Ambulatory Visit (INDEPENDENT_AMBULATORY_CARE_PROVIDER_SITE_OTHER): Payer: 59 | Admitting: Physician Assistant

## 2013-06-26 VITALS — BP 144/82 | HR 83 | Temp 98.6°F | Resp 16 | Ht 67.5 in | Wt 207.4 lb

## 2013-06-26 DIAGNOSIS — R195 Other fecal abnormalities: Secondary | ICD-10-CM

## 2013-06-26 DIAGNOSIS — J309 Allergic rhinitis, unspecified: Secondary | ICD-10-CM

## 2013-06-26 DIAGNOSIS — J329 Chronic sinusitis, unspecified: Secondary | ICD-10-CM

## 2013-06-26 MED ORDER — FLUTICASONE PROPIONATE 50 MCG/ACT NA SUSP: 2.0000 | Freq: Every day | NASAL | Status: DC

## 2013-06-26 MED ORDER — AMOXICILLIN-POT CLAVULANATE 875-125 MG PO TABS: 1.0000 | ORAL_TABLET | Freq: Two times a day (BID) | ORAL | Status: DC

## 2013-06-26 NOTE — Patient Instructions (Signed)
For light stools -- Please obtain labs.  I will call you with your results.  For sinus symptoms -- Take Augmentin as directed with food.  Increase fluid intake.  Rest.  Use saline nasal spray.  Place a humidifier in the bedroom.  Delsym for cough. I feel that there is an allergic component to the recurrence of your symptoms.  Please use Flonase daily.  Take a claritin or zyrtec daily.  Avoid exposure to mold/mildew -- that you mentioned in your bathroom.

## 2013-06-26 NOTE — Progress Notes (Signed)
Pre visit review using our clinic review tool, if applicable. No additional management support is needed unless otherwise documented below in the visit note/SLS  

## 2013-06-27 DIAGNOSIS — J329 Chronic sinusitis, unspecified: Secondary | ICD-10-CM | POA: Insufficient documentation

## 2013-06-27 DIAGNOSIS — R195 Other fecal abnormalities: Secondary | ICD-10-CM | POA: Insufficient documentation

## 2013-06-27 LAB — COMPREHENSIVE METABOLIC PANEL
ALT: 40 U/L (ref 0–53)
AST: 31 U/L (ref 0–37)
Albumin: 4.4 g/dL (ref 3.5–5.2)
Alkaline Phosphatase: 67 U/L (ref 39–117)
BILIRUBIN TOTAL: 0.5 mg/dL (ref 0.3–1.2)
BUN: 12 mg/dL (ref 6–23)
CO2: 28 meq/L (ref 19–32)
Calcium: 9.3 mg/dL (ref 8.4–10.5)
Chloride: 102 mEq/L (ref 96–112)
Creatinine, Ser: 0.9 mg/dL (ref 0.4–1.5)
GFR: 99.03 mL/min (ref >60.00)
Glucose, Bld: 79 mg/dL (ref 70–99)
Potassium: 4 mEq/L (ref 3.5–5.1)
Sodium: 138 mEq/L (ref 135–145)
Total Protein: 7.9 g/dL (ref 6.0–8.3)

## 2013-06-27 NOTE — Assessment & Plan Note (Signed)
Physical exam unremarkable.  History does not seem consistent with biliary pathology.  Light stools likely dietary related.  Will obtain CMP to assess liver enzymes and alk phos. 

## 2013-06-27 NOTE — Assessment & Plan Note (Signed)
Rx Augmentin.  Increase fluids.  Daily claritin and Flonase.  Saline nasal spray. Plain Mucinex.  Humidifier in bedroom.  Probiotic.

## 2013-06-27 NOTE — Progress Notes (Signed)
Patient presents to clinic today c/o sinus pressure, sinus pain, ear pain and pressure, PND and productive cough.  Patient with similar symptoms ~ 1 month ago.  Denies complete resolution of previous symptoms with Z-pack.  Patient denies fever, SOB, pleuritic chest pain.  Denies recent travel or sick contact.  Patient also endorses a couple of episodes of light gray stools.  Denies abdominal pain, tenesmus, melena or hematochezia.  Denies N/V.  Has a + family hx of gallstones.  Past Medical History  Diagnosis Date  . Hyperlipidemia     Current Outpatient Prescriptions on File Prior to Visit  Medication Sig Dispense Refill  . cloNIDine (CATAPRES) 0.1 MG tablet TAKE 1 TABLET BY MOUTH EVERY DAY  90 tablet  0  . multivitamin (ONE-A-DAY MEN'S) TABS Take 1 tablet by mouth daily.      . Omega-3 Fatty Acids (FISH OIL PO) Take by mouth daily.       No current facility-administered medications on file prior to visit.    No Known Allergies  Family History  Problem Relation Age of Onset  . Hypertension Father   . Diabetes Mother   . Prostate cancer Paternal Grandfather   . Cancer Maternal Grandmother     colon  . Cancer Mother     cholangiocarcinoma    History   Social History  . Marital Status: Married    Spouse Name: N/A    Number of Children: N/A  . Years of Education: N/A   Social History Main Topics  . Smoking status: Former Smoker    Quit date: 03/17/2007  . Smokeless tobacco: Never Used  . Alcohol Use: Yes     Comment: 1-2 beers per week  . Drug Use: No  . Sexual Activity: None   Other Topics Concern  . None   Social History Narrative   Married, twin girls.   Review of Systems - See HPI.  All other ROS are negative.  BP 144/82  Pulse 83  Temp(Src) 98.6 F (37 C) (Oral)  Resp 16  Ht 5' 7.5" (1.715 m)  Wt 207 lb 6 oz (94.065 kg)  BMI 31.98 kg/m2  SpO2 98%  Physical Exam  Constitutional: He is oriented to person, place, and time and well-developed,  well-nourished, and in no distress.  HENT:  Head: Normocephalic and atraumatic.  Right Ear: External ear normal.  Left Ear: External ear normal.  Nose: Nose normal.  Mouth/Throat: Oropharynx is clear and moist. No oropharyngeal exudate.  TM within normal limits.  + TTP of sinuses on exam.  Eyes: Conjunctivae are normal. Pupils are equal, round, and reactive to light.  Neck: Neck supple.  Cardiovascular: Normal rate, regular rhythm, normal heart sounds and intact distal pulses.   Pulmonary/Chest: Effort normal and breath sounds normal. No respiratory distress. He has no wheezes. He has no rales. He exhibits no tenderness.  Abdominal: Soft. Bowel sounds are normal. He exhibits no distension and no mass. There is no tenderness. There is no rebound and no guarding.  Lymphadenopathy:    He has no cervical adenopathy.  Neurological: He is alert and oriented to person, place, and time.  Skin: Skin is warm and dry. No rash noted.  Psychiatric: Affect normal.    Recent Results (from the past 2160 hour(s))  COMPREHENSIVE METABOLIC PANEL     Status: None   Collection Time    06/26/13  4:32 PM      Result Value Ref Range   Sodium 138  135 - 145  mEq/L   Potassium 4.0  3.5 - 5.1 mEq/L   Chloride 102  96 - 112 mEq/L   CO2 28  19 - 32 mEq/L   Glucose, Bld 79  70 - 99 mg/dL   BUN 12  6 - 23 mg/dL   Creatinine, Ser 0.9  0.4 - 1.5 mg/dL   Total Bilirubin 0.5  0.3 - 1.2 mg/dL   Alkaline Phosphatase 67  39 - 117 U/L   AST 31  0 - 37 U/L   ALT 40  0 - 53 U/L   Total Protein 7.9  6.0 - 8.3 g/dL   Albumin 4.4  3.5 - 5.2 g/dL   Calcium 9.3  8.4 - 10.5 mg/dL   GFR 99.03  >60.00 mL/min    Assessment/Plan: Light stools Physical exam unremarkable.  History does not seem consistent with biliary pathology.  Light stools likely dietary related.  Will obtain CMP to assess liver enzymes and alk phos.  Sinusitis Rx Augmentin.  Increase fluids.  Daily claritin and Flonase.  Saline nasal spray. Plain  Mucinex.  Humidifier in bedroom.  Probiotic.

## 2013-07-19 ENCOUNTER — Telehealth: Payer: Self-pay

## 2013-07-19 NOTE — Telephone Encounter (Signed)
Medication List and allergies:  Updated and Reviewed  90 day supply/mail order: n/a Local prescriptions:  [id number] - [place]  Immunization due:  UTD  A/P: Personal, family and PSH: Reviewed and updated Flu- 12/2012 Tdap- 09/15/10   To discuss with provider: Nothing at this time.

## 2013-07-19 NOTE — Telephone Encounter (Signed)
Left message for call back Non-identifiable   Td- 09/15/10

## 2013-07-20 ENCOUNTER — Ambulatory Visit: Payer: Self-pay | Admitting: Diagnostic Neuroimaging

## 2013-07-20 ENCOUNTER — Ambulatory Visit (INDEPENDENT_AMBULATORY_CARE_PROVIDER_SITE_OTHER): Payer: 59 | Admitting: Family Medicine

## 2013-07-20 ENCOUNTER — Encounter: Payer: Self-pay | Admitting: Family Medicine

## 2013-07-20 VITALS — BP 134/88 | HR 101 | Temp 98.2°F | Resp 16 | Ht 68.5 in | Wt 208.4 lb

## 2013-07-20 DIAGNOSIS — Z0000 Encounter for general adult medical examination without abnormal findings: Secondary | ICD-10-CM

## 2013-07-20 DIAGNOSIS — R6882 Decreased libido: Secondary | ICD-10-CM

## 2013-07-20 NOTE — Progress Notes (Signed)
   Subjective:    Patient ID: Dakota Reid, male    DOB: May 02, 1970, 43 y.o.   MRN: [id number]  HPI CPE- 'i'm exhausted at all the time'.  Denies sadness, anger.  + decreased libido, 'i have no interest whatsoever'.     Review of Systems Patient reports no vision/hearing changes, anorexia, fever ,adenopathy, persistant/recurrent hoarseness, swallowing issues, chest pain, palpitations, edema, persistant/recurrent cough, hemoptysis, dyspnea (rest,exertional, paroxysmal nocturnal), gastrointestinal  bleeding (melena, rectal bleeding), abdominal pain, syncope, focal weakness, memory loss, skin/hair/nail changes, depression, anxiety, abnormal bruising/bleeding, musculoskeletal symptoms/signs.  + positional numbness of arms bilaterally, intermittently + decreased urinary flow 3-4x/month + GERD     Objective:   Physical Exam BP 134/88  Pulse 101  Temp(Src) 98.2 F (36.8 C) (Oral)  Resp 16  Ht 5' 8.5" (1.74 m)  Wt 208 lb 6 oz (94.518 kg)  BMI 31.22 kg/m2  SpO2 98%  General Appearance:    Alert, cooperative, no distress, appears stated age  Head:    Normocephalic, without obvious abnormality, atraumatic  Eyes:    PERRL, conjunctiva/corneas clear, EOM's intact, fundi    benign, both eyes       Ears:    Normal TM's and external ear canals, both ears  Nose:   Nares normal, septum midline, mucosa normal, no drainage   or sinus tenderness  Throat:   Lips, mucosa, and tongue normal; teeth and gums normal  Neck:   Supple, symmetrical, trachea midline, no adenopathy;       thyroid:  No enlargement/tenderness/nodules  Back:     Symmetric, no curvature, ROM normal, no CVA tenderness  Lungs:     Clear to auscultation bilaterally, respirations unlabored  Chest wall:    No tenderness or deformity  Heart:    Regular rate and rhythm, S1 and S2 normal, no murmur, rub   or gallop  Abdomen:     Soft, non-tender, bowel sounds active all four quadrants,    no masses, no organomegaly  Genitalia:     Normal male without lesion, discharge or tenderness  Rectal:    Normal tone, normal prostate, no masses or tenderness  Extremities:   Extremities normal, atraumatic, no cyanosis or edema  Pulses:   2+ and symmetric all extremities  Skin:   Skin color, texture, turgor normal, no rashes or lesions  Lymph nodes:   Cervical, supraclavicular, and axillary nodes normal  Neurologic:   CNII-XII intact. Normal strength, sensation and reflexes      throughout          Assessment & Plan:

## 2013-07-20 NOTE — Patient Instructions (Addendum)
Follow up in 6 months to recheck cholesterol and BP We'll notify you of your lab results and make any changes if needed The neck pain/headaches are positional/tension The numbness of the hands is also positional- make sure to move them regularly Call with any questions or concerns Happy Spring!!!

## 2013-07-20 NOTE — Progress Notes (Signed)
Pre visit review using our clinic review tool, if applicable. No additional management support is needed unless otherwise documented below in the visit note. 

## 2013-07-21 ENCOUNTER — Other Ambulatory Visit: Payer: Self-pay | Admitting: Family Medicine

## 2013-07-21 DIAGNOSIS — R7989 Other specified abnormal findings of blood chemistry: Secondary | ICD-10-CM

## 2013-07-21 DIAGNOSIS — E785 Hyperlipidemia, unspecified: Secondary | ICD-10-CM

## 2013-07-21 LAB — HEPATIC FUNCTION PANEL
ALBUMIN: 4.7 g/dL (ref 3.5–5.2)
ALT: 65 U/L — ABNORMAL HIGH (ref 0–53)
AST: 41 U/L — ABNORMAL HIGH (ref 0–37)
Alkaline Phosphatase: 64 U/L (ref 39–117)
Bilirubin, Direct: 0.1 mg/dL (ref 0.0–0.3)
TOTAL PROTEIN: 7.8 g/dL (ref 6.0–8.3)
Total Bilirubin: 0.8 mg/dL (ref 0.2–1.2)

## 2013-07-21 LAB — CBC WITH DIFFERENTIAL/PLATELET
BASOS ABS: 0 10*3/uL (ref 0.0–0.1)
BASOS PCT: 0.2 % (ref 0.0–3.0)
Eosinophils Absolute: 0.3 10*3/uL (ref 0.0–0.7)
Eosinophils Relative: 3.1 % (ref 0.0–5.0)
HCT: 48 % (ref 39.0–52.0)
HEMOGLOBIN: 16.6 g/dL (ref 13.0–17.0)
LYMPHS PCT: 31.9 % (ref 12.0–46.0)
Lymphs Abs: 3.2 10*3/uL (ref 0.7–4.0)
MCHC: 34.7 g/dL (ref 30.0–36.0)
MCV: 84.3 fl (ref 78.0–100.0)
Monocytes Absolute: 0.9 10*3/uL (ref 0.1–1.0)
Monocytes Relative: 8.6 % (ref 3.0–12.0)
NEUTROS ABS: 5.7 10*3/uL (ref 1.4–7.7)
Neutrophils Relative %: 56.2 % (ref 43.0–77.0)
Platelets: 297 10*3/uL (ref 150.0–400.0)
RBC: 5.69 Mil/uL (ref 4.22–5.81)
RDW: 12.8 % (ref 11.5–15.5)
WBC: 10.1 10*3/uL (ref 4.0–10.5)

## 2013-07-21 LAB — PSA: PSA: 0.55 ng/mL (ref 0.10–4.00)

## 2013-07-21 LAB — LIPID PANEL
CHOLESTEROL: 283 mg/dL — AB (ref 0–200)
HDL: 44.4 mg/dL (ref >39.00)
LDL Cholesterol: 148 mg/dL — ABNORMAL HIGH (ref 0–99)
Total CHOL/HDL Ratio: 6
Triglycerides: 455 mg/dL — ABNORMAL HIGH (ref 0.0–149.0)
VLDL: 91 mg/dL — AB (ref 0.0–40.0)

## 2013-07-21 LAB — BASIC METABOLIC PANEL
BUN: 14 mg/dL (ref 6–23)
CALCIUM: 9.7 mg/dL (ref 8.4–10.5)
CO2: 28 mEq/L (ref 19–32)
Chloride: 103 mEq/L (ref 96–112)
Creatinine, Ser: 1.2 mg/dL (ref 0.4–1.5)
GFR: 72.42 mL/min (ref >60.00)
GLUCOSE: 65 mg/dL — AB (ref 70–99)
Potassium: 3.6 mEq/L (ref 3.5–5.1)
SODIUM: 140 meq/L (ref 135–145)

## 2013-07-21 LAB — TESTOSTERONE, FREE, TOTAL, SHBG
SEX HORMONE BINDING: 16 nmol/L (ref 13–71)
Testosterone, Free: 54.2 pg/mL (ref 47.0–244.0)
Testosterone-% Free: 2.7 % (ref 1.6–2.9)
Testosterone: 200 ng/dL — ABNORMAL LOW (ref 300–890)

## 2013-07-21 LAB — TSH: TSH: 1.25 u[IU]/mL (ref 0.35–4.50)

## 2013-07-21 MED ORDER — ATORVASTATIN CALCIUM 20 MG PO TABS: 20.0000 mg | ORAL_TABLET | Freq: Every day | ORAL | Status: DC

## 2013-07-23 NOTE — Assessment & Plan Note (Signed)
New.  Check testosterone level.  Treat/refer prn.

## 2013-07-23 NOTE — Assessment & Plan Note (Signed)
Pt's PE WNL.  Check labs.  Anticipatory guidance provided.  

## 2013-07-24 ENCOUNTER — Ambulatory Visit (INDEPENDENT_AMBULATORY_CARE_PROVIDER_SITE_OTHER): Payer: 59 | Admitting: Endocrinology

## 2013-07-24 ENCOUNTER — Other Ambulatory Visit: Payer: Self-pay | Admitting: *Deleted

## 2013-07-24 ENCOUNTER — Encounter: Payer: Self-pay | Admitting: Endocrinology

## 2013-07-24 VITALS — BP 128/84 | HR 76 | Temp 98.1°F | Resp 14 | Ht 69.0 in | Wt 209.6 lb

## 2013-07-24 DIAGNOSIS — E291 Testicular hypofunction: Secondary | ICD-10-CM

## 2013-07-24 DIAGNOSIS — E785 Hyperlipidemia, unspecified: Secondary | ICD-10-CM

## 2013-07-24 DIAGNOSIS — R5381 Other malaise: Secondary | ICD-10-CM

## 2013-07-24 DIAGNOSIS — I10 Essential (primary) hypertension: Secondary | ICD-10-CM

## 2013-07-24 DIAGNOSIS — R5383 Other fatigue: Secondary | ICD-10-CM

## 2013-07-24 LAB — T4, FREE: Free T4: 0.89 ng/dL (ref 0.60–1.60)

## 2013-07-24 LAB — GLUCOSE, RANDOM: Glucose, Bld: 92 mg/dL (ref 70–99)

## 2013-07-24 LAB — LUTEINIZING HORMONE: LH: 1.64 m[IU]/mL (ref 1.50–9.30)

## 2013-07-24 NOTE — Progress Notes (Signed)
Patient ID: Dakota Reid, male   DOB: 1970-04-29, 43 y.o.   MRN: [id number]            Chief complaint: Tiredness for one year  History of Present Illness:  For the last year he has been feeling overall tired. He says he wakes up feeling tired despite sleeping through the night. He also has not had much motivation as well as a low mood and irritability He does not exercise but he thinks that his legs may be relatively weaker He has had progressively decreasing libido for the last 5 years and this is quite low at this time. He does tend to have heat intolerance but no sweating episodes or hot flashes. No previous history of testicular injury or mumps Because of low libido he had a testosterone level checked recently with his physical  Prior lab results show baselinetestosterone level of 200 with a normal free testosterone level of 54, the labs were drawn at 4:30 p.m. in the afternoon   Lab Results  Component Value Date   TESTOSTERONE 200* 07/20/2013   Weight gain: He started gaining weight about 4-5 years ago and he thinks he has gained overall 40 pounds. However this has been about the same in the last year   Wt Readings from Last 3 Encounters:  07/24/13 209 lb 9.6 oz (95.074 kg)  07/20/13 208 lb 6 oz (94.518 kg)  06/26/13 207 lb 6 oz (94.065 kg)       Medication List       This list is accurate as of: 07/24/13  9:09 AM.  Always use your most recent med list.               atorvastatin 20 MG tablet  Commonly known as:  LIPITOR  Take 1 tablet (20 mg total) by mouth daily.     cetirizine 10 MG tablet  Commonly known as:  ZYRTEC  Take 10 mg by mouth daily.     cloNIDine 0.1 MG tablet  Commonly known as:  CATAPRES  TAKE 1 TABLET BY MOUTH EVERY DAY     FISH OIL PO  Take by mouth daily.     fluticasone 50 MCG/ACT nasal spray  Commonly known as:  FLONASE  Place 2 sprays into both nostrils daily.     multivitamin Tabs tablet  Take 1 tablet by mouth daily.        Allergies: No Known Allergies  Past Medical History  Diagnosis Date  . Hyperlipidemia   . Hypertension     No past surgical history on file.  Family History  Problem Relation Age of Onset  . Hypertension Father   . Diabetes Mother   . Prostate cancer Paternal Grandfather   . Cancer Maternal Grandmother     colon  . Cancer Mother     cholangiocarcinoma    Social History:  reports that he quit smoking about 6 years ago. He has never used smokeless tobacco. He reports that he drinks alcohol. He reports that he does not use illicit drugs.  ROS  He was started on clonidine by his neurologist for a tic in his throat about a year ago This has helped the symptom and also he thinks he may be sleeping better with this Previously also had increased blood pressure   Has  history of  occasional headaches, mostly the back of the head  No history of blurred vision including peripheral vision  No history of abnormal fasting glucose or diabetes  He  has a previous history of excessive snoring as well as waking up stopping breathing as noted by his wife. However he has not done this for the last year or so He does wake up feeling tired and will take naps on the weekends  His legs and feet are sore when he wakes up in the morning but better the rest of the day. No pains, tingling or numbness in his legs or feet during the night May occasionally have tingling in his hands  General Examination:   BP 128/84  Pulse 76  Temp(Src) 98.1 F (36.7 C)  Resp 14  Ht 5\' 9"  (1.753 m)  Wt 209 lb 9.6 oz (95.074 kg)  BMI 30.94 kg/m2  SpO2 96%  GENERAL APPEARANCE  he has abdominal obesity.  SKIN:normal, no rash or pigmentation.  HEENT:Oral mucosa normal.  has relatively narrow oropharyngeal opening.  EYES:normal external appearance of eyes, Fundi  does not show abnormal discs   NECK:no lymphadenopathy, no thyromegaly.  CHEST: Gynecomastia  absent LUNGS:clear to auscultation bilaterally, no wheezes, rhonchi, rales.   HEART:normal S1 And S2, no S3, S4, murmur or click.  ABDOMEN:no hepatosplenomegaly, no masses palpated, soft and not tender.   MALE GENITOURINARY: he has retractile testes, appear to be relatively small in size  MUSCULOSKELETALNo enlargement or deformity of joints.  EXTREMITIES:no clubbing, no edema.  NEUROLOGIC EXAM: Biceps reflexes  difficult to elicit  Assessment/ Plan:  1.  Probable hypogonadotropic hypogonadism  Although this will need to be confirmed by drawing a morning testosterone level. He does have a relatively normal free testosterone although still in the lower end of the range. His current symptoms of decreased libido, fatigue, low motivation level may be related to testosterone deficiency Difficult to clinically assess his testicular size as he has retractile testes but they appear to be relatively smaller than normal Most likely his testosterone deficiency is related to insulin resistance syndrome because of his dyslipidemia, hypertension, abdominal obesity and family history of diabetes Will evaluate this further today with fasting free testosterone level, LH, prolactin levels  2. Fatigue and daytime somnolence: His body habitus, neck size and history of snoring previously suggests he may have sleep apnea which may need to be ruled out Also will check free T4 to rule out secondary hypothyroidism  3. Check fasting glucose today   Elayne Snare 07/24/2013, 9:09 AM

## 2013-07-27 LAB — TESTOSTERONE, FREE, TOTAL, SHBG
Testosterone, Free: 13.1 pg/mL (ref 6.8–21.5)
Testosterone, total: 346.7 ng/dL — ABNORMAL LOW (ref 348.0–1197.0)

## 2013-07-27 LAB — PROLACTIN: Prolactin: 9.9 ng/mL (ref 4.0–15.2)

## 2013-07-28 ENCOUNTER — Other Ambulatory Visit: Payer: Self-pay | Admitting: *Deleted

## 2013-07-28 MED ORDER — TESTOSTERONE 20.25 MG/1.25GM (1.62%) TD GEL: TRANSDERMAL | Status: DC

## 2013-07-31 ENCOUNTER — Other Ambulatory Visit: Payer: Self-pay | Admitting: *Deleted

## 2013-07-31 MED ORDER — TESTOSTERONE 50 MG/5GM (1%) TD GEL: 5.0000 g | Freq: Every day | TRANSDERMAL | Status: DC

## 2013-08-09 ENCOUNTER — Telehealth: Payer: Self-pay | Admitting: *Deleted

## 2013-08-09 NOTE — Telephone Encounter (Signed)
Inquiring different RX that is covering by insurance andro gel, needs testum instead

## 2013-08-22 ENCOUNTER — Telehealth: Payer: Self-pay | Admitting: *Deleted

## 2013-08-22 NOTE — Telephone Encounter (Signed)
Patient called, he called his insurance company about his testosterone gel, they told him the only one that may be covered would be the Testim and that requires a PA.  They said what doesn't need a PA is First testos, a testosterone powder, or the Injection.  Please advise

## 2013-08-22 NOTE — Telephone Encounter (Signed)
Testim 5 g daily, will need to do PA

## 2013-08-22 NOTE — Telephone Encounter (Signed)
PA faxed

## 2013-08-24 NOTE — Telephone Encounter (Signed)
PA was approved, patient notified

## 2013-09-10 ENCOUNTER — Other Ambulatory Visit: Payer: Self-pay | Admitting: Diagnostic Neuroimaging

## 2013-09-19 ENCOUNTER — Encounter: Payer: Self-pay | Admitting: Diagnostic Neuroimaging

## 2013-09-19 ENCOUNTER — Ambulatory Visit (INDEPENDENT_AMBULATORY_CARE_PROVIDER_SITE_OTHER): Payer: 59 | Admitting: Diagnostic Neuroimaging

## 2013-09-19 ENCOUNTER — Other Ambulatory Visit (INDEPENDENT_AMBULATORY_CARE_PROVIDER_SITE_OTHER): Payer: 59

## 2013-09-19 VITALS — BP 132/86 | HR 82 | Ht 67.0 in | Wt 211.0 lb

## 2013-09-19 DIAGNOSIS — F951 Chronic motor or vocal tic disorder: Secondary | ICD-10-CM

## 2013-09-19 DIAGNOSIS — E785 Hyperlipidemia, unspecified: Secondary | ICD-10-CM

## 2013-09-19 LAB — HEPATIC FUNCTION PANEL
ALBUMIN: 4.7 g/dL (ref 3.5–5.2)
ALK PHOS: 63 U/L (ref 39–117)
ALT: 66 U/L — ABNORMAL HIGH (ref 0–53)
AST: 43 U/L — ABNORMAL HIGH (ref 0–37)
BILIRUBIN DIRECT: 0.1 mg/dL (ref 0.0–0.3)
TOTAL PROTEIN: 7.7 g/dL (ref 6.0–8.3)
Total Bilirubin: 0.8 mg/dL (ref 0.2–1.2)

## 2013-09-19 MED ORDER — CLONIDINE HCL 0.1 MG PO TABS: 0.1000 mg | ORAL_TABLET | Freq: Every day | ORAL | Status: DC

## 2013-09-19 NOTE — Patient Instructions (Signed)
Continue clonidine 

## 2013-09-19 NOTE — Progress Notes (Signed)
[facility]  PATIENT: Dakota Reid DOB: Jan 21, 1971  REFERRING CLINICIAN:  HISTORY FROM: patient  REASON FOR VISIT: chronic tic disorder   HISTORICAL  CHIEF COMPLAINT:  Chief Complaint  Patient presents with  . Follow-up    need med refill    HISTORY OF PRESENT ILLNESS:   UPDATE 09/19/13: Since last visit, started on clonidine and tics have significantly reduced by 85-90%. He is satisfied with results. Doing well. BP is slightly up on persistent basis.   PRIOR 04/01/13: 43 year old right-handed male with hypertension, hypercholesteremia, here for evaluation of involuntary throat clicking movements and neck movements. Patient has had abnormal involuntary movements of his neck and head since he was 43 years old. These progressively got worse over time. He never sought medical attention for this. Symptoms are worse under stress and better if he has sleep. Symptoms and be better in the morning. Nowadays symptoms consist of intermittent throat clicking sounds, throat clearing, rapid movements of his head and right shoulder. No clear vocalizations with words. Patient try taking magnesium supplement 2 years ago which may have helped initially but no longer helps. Patient has some OCD traits according to the wife. No ADHD symptoms. His daughter recently was dx'd with OCD. There is family history of possible tic disorder in his uncle (eye blinking) and his mother (throat clearing)   REVIEW OF SYSTEMS: Full 14 system review of systems performed and notable only for freq waking daytime sleepiness.   ALLERGIES: No Known Allergies  HOME MEDICATIONS: Outpatient Prescriptions Prior to Visit  Medication Sig Dispense Refill  . atorvastatin (LIPITOR) 20 MG tablet Take 1 tablet (20 mg total) by mouth daily.  30 tablet  6  . multivitamin (ONE-A-DAY MEN'S) TABS Take 1 tablet by mouth daily.      . Omega-3 Fatty Acids (FISH OIL PO) Take by mouth daily.      . cloNIDine (CATAPRES)  0.1 MG tablet TAKE 1 TABLET DAILY  30 tablet  0  . cetirizine (ZYRTEC) 10 MG tablet Take 10 mg by mouth daily.      . fluticasone (FLONASE) 50 MCG/ACT nasal spray Place 2 sprays into both nostrils daily.  16 g  6  . testosterone (TESTIM) 50 MG/5GM (1%) GEL Place 5 g onto the skin daily.  30 Tube  3   No facility-administered medications prior to visit.    PAST MEDICAL HISTORY: Past Medical History  Diagnosis Date  . Hyperlipidemia   . Hypertension     PAST SURGICAL HISTORY: History reviewed. No pertinent past surgical history.  FAMILY HISTORY: Family History  Problem Relation Age of Onset  . Hypertension Father   . Diabetes Mother   . Cancer Mother     cholangiocarcinoma  . Prostate cancer Paternal Grandfather   . Cancer Maternal Grandmother     colon  . Colon cancer Maternal Grandmother   . Lung cancer Maternal Grandmother     SOCIAL HISTORY:  History   Social History  . Marital Status: Married    Spouse Name: N/A    Number of Children: 2  . Years of Education: BA   Occupational History  .      IT consultant   Social History Main Topics  . Smoking status: Former Smoker    Quit date: 03/17/2007  . Smokeless tobacco: Never Used  . Alcohol Use: Yes     Comment: 1-2 beers per week  . Drug Use: No  . Sexual Activity: Not on file   Other Topics  Concern  . Not on file   Social History Narrative   Married, twin girls.   Patient lives at home with his family.   Caffeine Use:      PHYSICAL EXAM  Filed Vitals:   09/19/13 1430  BP: 132/86  Pulse: 82  Height: 5\' 7"  (1.702 m)  Weight: 211 lb (95.709 kg)    Not recorded    Body mass index is 33.04 kg/(m^2).  GENERAL EXAM: Patient is in no distress; well developed, nourished and groomed; neck is supple  CARDIOVASCULAR: Regular rate and rhythm, no murmurs, no carotid bruits  NEUROLOGIC: MENTAL STATUS: awake, alert, language fluent, comprehension intact, naming intact, fund of knowledge  appropriate CRANIAL NERVE: no papilledema on fundoscopic exam, pupils equal and reactive to light, visual fields full to confrontation, extraocular muscles intact, no nystagmus, facial sensation and strength symmetric, hearing intact, palate elevates symmetrically, uvula midline, shoulder shrug symmetric, tongue midline. MOTOR: normal bulk and tone, full strength in the BUE, BLE SENSORY: normal and symmetric to light touch COORDINATION: finger-nose-finger, fine finger movements normal REFLEXES: deep tendon reflexes present and symmetric GAIT/STATION: narrow based gait; able to walk tandem; romberg is negative    DIAGNOSTIC DATA (LABS, IMAGING, TESTING) - I reviewed patient records, labs, notes, testing and imaging myself where available.  Lab Results  Component Value Date   WBC 10.1 07/20/2013   HGB 16.6 07/20/2013   HCT 48.0 07/20/2013   MCV 84.3 07/20/2013   PLT 297.0 07/20/2013      Component Value Date/Time   NA 140 07/20/2013 1624   K 3.6 07/20/2013 1624   CL 103 07/20/2013 1624   CO2 28 07/20/2013 1624   GLUCOSE 92 07/24/2013 0938   BUN 14 07/20/2013 1624   CREATININE 1.2 07/20/2013 1624   CALCIUM 9.7 07/20/2013 1624   PROT 7.8 07/20/2013 1624   ALBUMIN 4.7 07/20/2013 1624   AST 41* 07/20/2013 1624   ALT 65* 07/20/2013 1624   ALKPHOS 64 07/20/2013 1624   BILITOT 0.8 07/20/2013 1624   GFRNONAA 105.09 08/15/2009 1052   Lab Results  Component Value Date   CHOL 283* 07/20/2013   HDL 44.40 07/20/2013   LDLCALC 148* 07/20/2013   LDLDIRECT 180.1 07/13/2012   TRIG 455.0 Triglyceride is over 400; calculations on Lipids are invalid.* 07/20/2013   CHOLHDL 6 07/20/2013   Lab Results  Component Value Date   HGBA1C 5.5 09/15/2010   No results found for this basename: VITAMINB12   Lab Results  Component Value Date   TSH 1.25 07/20/2013    I reviewed images myself and agree with interpretation. -VRP  04/07/12 MRI brain - normal   ASSESSMENT AND PLAN  43 y.o. year old male here with chronic tic disorder. Doing well  on clonidine. Monitor BP, reduce salt intake and increase physical activity.  PLAN: - continue clonidine 0.1mg  qhs  Return in about 1 year (around 09/20/2014).    Penni Bombard, MD 06/18/4096, 1:19 PM Certified in Neurology, Neurophysiology and Neuroimaging  [facility] [address], [place], [place] [address] [phone]

## 2014-02-12 ENCOUNTER — Other Ambulatory Visit: Payer: Self-pay | Admitting: General Practice

## 2014-02-12 MED ORDER — ATORVASTATIN CALCIUM 20 MG PO TABS: 20.0000 mg | ORAL_TABLET | Freq: Every day | ORAL | Status: DC

## 2014-03-20 ENCOUNTER — Other Ambulatory Visit: Payer: Self-pay | Admitting: General Practice

## 2014-03-20 ENCOUNTER — Encounter: Payer: Self-pay | Admitting: General Practice

## 2014-03-20 MED ORDER — ATORVASTATIN CALCIUM 20 MG PO TABS: 20.0000 mg | ORAL_TABLET | Freq: Every day | ORAL | Status: DC

## 2014-04-18 ENCOUNTER — Ambulatory Visit (HOSPITAL_BASED_OUTPATIENT_CLINIC_OR_DEPARTMENT_OTHER): Payer: 59 | Attending: Otolaryngology | Admitting: Radiology

## 2014-04-18 VITALS — Ht 67.0 in | Wt 200.0 lb

## 2014-04-18 DIAGNOSIS — R0683 Snoring: Secondary | ICD-10-CM

## 2014-04-18 DIAGNOSIS — R5383 Other fatigue: Secondary | ICD-10-CM | POA: Diagnosis not present

## 2014-04-18 DIAGNOSIS — G4730 Sleep apnea, unspecified: Secondary | ICD-10-CM

## 2014-04-18 DIAGNOSIS — G4733 Obstructive sleep apnea (adult) (pediatric): Secondary | ICD-10-CM | POA: Insufficient documentation

## 2014-04-18 DIAGNOSIS — G4710 Hypersomnia, unspecified: Secondary | ICD-10-CM | POA: Diagnosis present

## 2014-04-21 DIAGNOSIS — R5383 Other fatigue: Secondary | ICD-10-CM

## 2014-04-21 DIAGNOSIS — R0683 Snoring: Secondary | ICD-10-CM

## 2014-04-21 DIAGNOSIS — G4730 Sleep apnea, unspecified: Secondary | ICD-10-CM

## 2014-04-21 NOTE — Sleep Study (Signed)
    NAME: Dakota Reid DATE OF BIRTH:  Aug 03, 1970 MEDICAL RECORD NUMBER [id number]  LOCATION: [facility]  PHYSICIAN: Anuoluwapo Mefferd D  DATE OF STUDY: 04/18/2014  SLEEP STUDY TYPE: Out of Center Sleep Test- unattended                REFERRING PHYSICIAN: Jodi Marble, MD  INDICATION FOR STUDY: hypersomnia with sleep apnea  EPWORTH SLEEPINESS SCORE:  12/24 HEIGHT: 5\' 7"  (170.2 cm)  WEIGHT: 90.719 kg (200 lb)    Body mass index is 31.32 kg/(m^2).  NECK SIZE: 16 in.  MEDICATIONS: charted for review  IMPRESSION:  Mild obstructive sleep apnea/hypopnea syndrome, AHI 13.9 per hour. 97 total events scored including 11 central apneas, 14 obstructive apneas, 14 mixed apneas, 58 hypopneas. Oxygen desaturation to a nadir of 87% and average oxygen saturation 93% on room air. Mean heart rate 62.9/m.    RECOMMENDATION:  Treatment for scores in this range most commonly starts with CPAP unless another approach is more appropriate on an individual basis.   [name] Diplomate, American Board of Sleep Medicine  ELECTRONICALLY SIGNED ON:  04/21/2014, 7:05 PM [place] PH: [phone]   FX: [phone] [place]

## 2014-05-04 ENCOUNTER — Encounter: Payer: Self-pay | Admitting: Family Medicine

## 2014-05-04 ENCOUNTER — Ambulatory Visit (INDEPENDENT_AMBULATORY_CARE_PROVIDER_SITE_OTHER): Payer: 59 | Admitting: Family Medicine

## 2014-05-04 VITALS — BP 126/80 | HR 76 | Temp 98.2°F | Resp 16 | Wt 208.1 lb

## 2014-05-04 DIAGNOSIS — E785 Hyperlipidemia, unspecified: Secondary | ICD-10-CM

## 2014-05-04 LAB — LIPID PANEL
CHOL/HDL RATIO: 4
Cholesterol: 146 mg/dL (ref 0–200)
HDL: 36.8 mg/dL — AB (ref >39.00)
NONHDL: 109.2
Triglycerides: 229 mg/dL — ABNORMAL HIGH (ref 0.0–149.0)
VLDL: 45.8 mg/dL — ABNORMAL HIGH (ref 0.0–40.0)

## 2014-05-04 LAB — BASIC METABOLIC PANEL
BUN: 13 mg/dL (ref 6–23)
CALCIUM: 9.8 mg/dL (ref 8.4–10.5)
CO2: 28 mEq/L (ref 19–32)
Chloride: 103 mEq/L (ref 96–112)
Creatinine, Ser: 1.07 mg/dL (ref 0.40–1.50)
GFR: 80.76 mL/min (ref >60.00)
Glucose, Bld: 99 mg/dL (ref 70–99)
Potassium: 4.4 mEq/L (ref 3.5–5.1)
SODIUM: 138 meq/L (ref 135–145)

## 2014-05-04 LAB — HEPATIC FUNCTION PANEL
ALK PHOS: 76 U/L (ref 39–117)
ALT: 48 U/L (ref 0–53)
AST: 29 U/L (ref 0–37)
Albumin: 4.7 g/dL (ref 3.5–5.2)
Bilirubin, Direct: 0.1 mg/dL (ref 0.0–0.3)
TOTAL PROTEIN: 7.7 g/dL (ref 6.0–8.3)
Total Bilirubin: 0.7 mg/dL (ref 0.2–1.2)

## 2014-05-04 LAB — LDL CHOLESTEROL, DIRECT: Direct LDL: 79 mg/dL

## 2014-05-04 NOTE — Assessment & Plan Note (Signed)
Chronic problem.  Tolerating statin w/o difficulty.  Has gained 8 lbs since last visit.  Encouraged healthy diet and regular exercise.  Check labs.  Adjust meds prn

## 2014-05-04 NOTE — Patient Instructions (Signed)
Schedule a skin tag removal at your convenience Schedule your complete physical in 6 months We'll notify you of your lab results and make any changes if needed Continue to make healthy food choices and get regular exercise Call with any questions or concerns [place]!!

## 2014-05-04 NOTE — Progress Notes (Signed)
   Subjective:    Patient ID: Dakota Reid, male    DOB: 17-May-1970, 44 y.o.   MRN: [id number]  HPI Hyperlipidemia- chronic problem, on Lipitor.  Denies abd pain, N/V, myalgias.  No CP, SOB, HAs, visual changes, edema.  Good compliance w/ meds.   Review of Systems For ROS see HPI     Objective:   Physical Exam  Constitutional: He is oriented to person, place, and time. He appears well-developed and well-nourished. No distress.  HENT:  Head: Normocephalic and atraumatic.  Eyes: Conjunctivae and EOM are normal. Pupils are equal, round, and reactive to light.  Neck: Normal range of motion. Neck supple. No thyromegaly present.  Cardiovascular: Normal rate, regular rhythm, normal heart sounds and intact distal pulses.   No murmur heard. Pulmonary/Chest: Effort normal and breath sounds normal. No respiratory distress.  Abdominal: Soft. Bowel sounds are normal. He exhibits no distension.  Musculoskeletal: He exhibits no edema.  Lymphadenopathy:    He has no cervical adenopathy.  Neurological: He is alert and oriented to person, place, and time. No cranial nerve deficit.  Skin: Skin is warm and dry.  Psychiatric: He has a normal mood and affect. His behavior is normal.  Vitals reviewed.         Assessment & Plan:

## 2014-05-04 NOTE — Progress Notes (Signed)
Pre visit review using our clinic review tool, if applicable. No additional management support is needed unless otherwise documented below in the visit note. 

## 2014-05-23 ENCOUNTER — Other Ambulatory Visit: Payer: Self-pay | Admitting: Family Medicine

## 2014-05-23 NOTE — Telephone Encounter (Signed)
Med filled.  

## 2014-09-24 ENCOUNTER — Ambulatory Visit (INDEPENDENT_AMBULATORY_CARE_PROVIDER_SITE_OTHER): Payer: 59 | Admitting: Diagnostic Neuroimaging

## 2014-09-24 ENCOUNTER — Encounter: Payer: Self-pay | Admitting: Diagnostic Neuroimaging

## 2014-09-24 VITALS — BP 138/97 | HR 70 | Ht 67.0 in | Wt 209.0 lb

## 2014-09-24 DIAGNOSIS — F951 Chronic motor or vocal tic disorder: Secondary | ICD-10-CM | POA: Diagnosis not present

## 2014-09-24 MED ORDER — CLONIDINE HCL 0.1 MG PO TABS: 0.1000 mg | ORAL_TABLET | Freq: Two times a day (BID) | ORAL | Status: DC

## 2014-09-24 NOTE — Progress Notes (Signed)
[facility]  PATIENT: Dakota Reid DOB: 1970-05-01  REFERRING CLINICIAN:  HISTORY FROM: patient  REASON FOR VISIT: chronic tic disorder   HISTORICAL  CHIEF COMPLAINT:  Chief Complaint  Patient presents with  . Chronic Tic disorder    rm 7, "Keep Rx, throat.mouth tics"    HISTORY OF PRESENT ILLNESS:   UPDATE 09/19/13: Since last visit, started on clonidine and tics have significantly reduced by 85-90%. He is satisfied with results. Doing well. BP is slightly up on persistent basis.   PRIOR 04/01/13: 44 year old right-handed male with hypertension, hypercholesteremia, here for evaluation of involuntary throat clicking movements and neck movements. Patient has had abnormal involuntary movements of his neck and head since he was 44 years old. These progressively got worse over time. He never sought medical attention for this. Symptoms are worse under stress and better if he has sleep. Symptoms and be better in the morning. Nowadays symptoms consist of intermittent throat clicking sounds, throat clearing, rapid movements of his head and right shoulder. No clear vocalizations with words. Patient try taking magnesium supplement 2 years ago which may have helped initially but no longer helps. Patient has some OCD traits according to the wife. No ADHD symptoms. His daughter recently was dx'd with OCD. There is family history of possible tic disorder in his uncle (eye blinking) and his mother (throat clearing)   REVIEW OF SYSTEMS: Full 14 system review of systems performed and notable only for freq waking daytime sleepiness.   ALLERGIES: No Known Allergies  HOME MEDICATIONS: Outpatient Prescriptions Prior to Visit  Medication Sig Dispense Refill  . atorvastatin (LIPITOR) 20 MG tablet Take 1 tablet by mouth  daily 90 tablet 1  . cloNIDine (CATAPRES) 0.1 MG tablet Take 1 tablet (0.1 mg total) by mouth daily. 90 tablet 4  . fluticasone (FLONASE) 50 MCG/ACT nasal spray  Place 2 sprays into both nostrils daily.  4  . multivitamin (ONE-A-DAY MEN'S) TABS Take 1 tablet by mouth daily.    . Omega-3 Fatty Acids (FISH OIL PO) Take by mouth daily.     No facility-administered medications prior to visit.    PAST MEDICAL HISTORY: Past Medical History  Diagnosis Date  . Hyperlipidemia   . Hypertension     PAST SURGICAL HISTORY: Past Surgical History  Procedure Laterality Date  . Wisdom tooth extraction      FAMILY HISTORY: Family History  Problem Relation Age of Onset  . Hypertension Father   . Kidney Stones Father   . Diabetes Mother   . Cancer Mother     cholangiocarcinoma  . Prostate cancer Paternal Grandfather   . Cancer Maternal Grandmother     colon  . Colon cancer Maternal Grandmother   . Lung cancer Maternal Grandmother     SOCIAL HISTORY:  History   Social History  . Marital Status: Married    Spouse Name: N/A  . Number of Children: 2  . Years of Education: BA   Occupational History  .      IT consultant   Social History Main Topics  . Smoking status: Former Smoker    Quit date: 03/17/2007  . Smokeless tobacco: Never Used  . Alcohol Use: Yes     Comment: 1-2 beers per week  . Drug Use: No  . Sexual Activity: Not on file   Other Topics Concern  . Not on file   Social History Narrative   Married, twin girls.   Patient lives at home with his family.  Caffeine Use:      PHYSICAL EXAM  Filed Vitals:   09/24/14 0822  BP: 138/97  Pulse: 70  Height: 5\' 7"  (1.702 m)  Weight: 209 lb (94.802 kg)    Not recorded      Body mass index is 32.73 kg/(m^2).   GENERAL EXAM: Patient is in no distress; well developed, nourished and groomed; neck is supple  CARDIOVASCULAR: Regular rate and rhythm, no murmurs, no carotid bruits  NEUROLOGIC: MENTAL STATUS: awake, alert, language fluent, comprehension intact, naming intact, fund of knowledge appropriate CRANIAL NERVE: pupils equal and reactive to light, visual  fields full to confrontation, extraocular muscles intact, no nystagmus, facial sensation and strength symmetric, hearing intact, palate elevates symmetrically, uvula midline, shoulder shrug symmetric, tongue midline. MOTOR: normal bulk and tone, full strength in the BUE, BLE SENSORY: normal and symmetric to light touch COORDINATION: finger-nose-finger, fine finger movements normal REFLEXES: deep tendon reflexes present and symmetric GAIT/STATION: narrow based gait; able to walk tandem; romberg is negative     DIAGNOSTIC DATA (LABS, IMAGING, TESTING) - I reviewed patient records, labs, notes, testing and imaging myself where available.  Lab Results  Component Value Date   WBC 10.1 07/20/2013   HGB 16.6 07/20/2013   HCT 48.0 07/20/2013   MCV 84.3 07/20/2013   PLT 297.0 07/20/2013      Component Value Date/Time   NA 138 05/04/2014 0925   K 4.4 05/04/2014 0925   CL 103 05/04/2014 0925   CO2 28 05/04/2014 0925   GLUCOSE 99 05/04/2014 0925   BUN 13 05/04/2014 0925   CREATININE 1.07 05/04/2014 0925   CALCIUM 9.8 05/04/2014 0925   PROT 7.7 05/04/2014 0925   ALBUMIN 4.7 05/04/2014 0925   AST 29 05/04/2014 0925   ALT 48 05/04/2014 0925   ALKPHOS 76 05/04/2014 0925   BILITOT 0.7 05/04/2014 0925   GFRNONAA 105.09 08/15/2009 1052   Lab Results  Component Value Date   CHOL 146 05/04/2014   HDL 36.80* 05/04/2014   LDLCALC 148* 07/20/2013   LDLDIRECT 79.0 05/04/2014   TRIG 229.0* 05/04/2014   CHOLHDL 4 05/04/2014   Lab Results  Component Value Date   HGBA1C 5.5 09/15/2010   No results found for: [id number] Lab Results  Component Value Date   TSH 1.25 07/20/2013    I reviewed images myself and agree with interpretation. -VRP  04/07/12 MRI brain - normal   ASSESSMENT AND PLAN  44 y.o. year old male here with chronic tic disorder. Doing well on clonidine. Monitor BP, reduce salt intake and increase physical activity.  PLAN: I spent 15 minutes of face to face time with  patient. Greater than 50% of time was spent in counseling and coordination of care with patient. In summary we discussed:   Patient Instructions  Monitor early morning BP.  Increase clonidine to 0.1mg  twice a day.   Return in about 6 months (around 03/27/2015).    Penni Bombard, MD 5/63/1497, 0:26 AM Certified in Neurology, Neurophysiology and Neuroimaging  [facility] [address] [place], [place] [address] [phone]

## 2014-09-24 NOTE — Patient Instructions (Signed)
Monitor early morning BP.  Increase clonidine to 0.1mg  twice a day.

## 2014-10-28 ENCOUNTER — Other Ambulatory Visit: Payer: Self-pay | Admitting: Family Medicine

## 2014-10-29 NOTE — Telephone Encounter (Signed)
Medication filled to pharmacy as requested.   

## 2014-11-01 ENCOUNTER — Encounter: Payer: Self-pay | Admitting: *Deleted

## 2014-11-01 ENCOUNTER — Telehealth: Payer: Self-pay | Admitting: *Deleted

## 2014-11-01 NOTE — Telephone Encounter (Signed)
Pt returning your call. Best # [phone].

## 2014-11-01 NOTE — Telephone Encounter (Signed)
Unable to reach patient at time of Pre-Visit Call.  Left message for patient to return call when available.    

## 2014-11-01 NOTE — Telephone Encounter (Signed)
Pre-Visit Call completed with patient and chart updated.   Pre-Visit Info documented in Specialty Comments under [employer].    

## 2014-11-01 NOTE — Addendum Note (Signed)
Addended by: Leticia Penna A on: 11/01/2014 04:05 PM   Modules accepted: Medications

## 2014-11-02 ENCOUNTER — Ambulatory Visit (INDEPENDENT_AMBULATORY_CARE_PROVIDER_SITE_OTHER): Payer: 59 | Admitting: Family Medicine

## 2014-11-02 ENCOUNTER — Encounter: Payer: 59 | Admitting: Family Medicine

## 2014-11-02 ENCOUNTER — Encounter: Payer: Self-pay | Admitting: Family Medicine

## 2014-11-02 ENCOUNTER — Encounter: Payer: Self-pay | Admitting: General Practice

## 2014-11-02 VITALS — BP 130/78 | HR 76 | Temp 98.0°F | Resp 16 | Ht 67.0 in | Wt 208.4 lb

## 2014-11-02 DIAGNOSIS — Z0000 Encounter for general adult medical examination without abnormal findings: Secondary | ICD-10-CM

## 2014-11-02 DIAGNOSIS — Q828 Other specified congenital malformations of skin: Secondary | ICD-10-CM | POA: Insufficient documentation

## 2014-11-02 LAB — LIPID PANEL
CHOLESTEROL: 179 mg/dL (ref 125–200)
HDL: 41 mg/dL (ref >=40)
LDL Cholesterol: 67 mg/dL (ref <130)
Total CHOL/HDL Ratio: 4.4 Ratio (ref <=5.0)
Triglycerides: 357 mg/dL — ABNORMAL HIGH (ref <150)
VLDL: 71 mg/dL — ABNORMAL HIGH (ref <30)

## 2014-11-02 LAB — CBC WITH DIFFERENTIAL/PLATELET
BASOS PCT: 0 % (ref 0–1)
Basophils Absolute: 0 10*3/uL (ref 0.0–0.1)
EOS PCT: 2 % (ref 0–5)
Eosinophils Absolute: 0.2 10*3/uL (ref 0.0–0.7)
HEMATOCRIT: 44.3 % (ref 39.0–52.0)
HEMOGLOBIN: 15.8 g/dL (ref 13.0–17.0)
Lymphocytes Relative: 37 % (ref 12–46)
Lymphs Abs: 3.4 10*3/uL (ref 0.7–4.0)
MCH: 28.8 pg (ref 26.0–34.0)
MCHC: 35.7 g/dL (ref 30.0–36.0)
MCV: 80.7 fL (ref 78.0–100.0)
MONO ABS: 0.7 10*3/uL (ref 0.1–1.0)
MONOS PCT: 7 % (ref 3–12)
MPV: 9.8 fL (ref 8.6–12.4)
NEUTROS ABS: 5 10*3/uL (ref 1.7–7.7)
Neutrophils Relative %: 54 % (ref 43–77)
Platelets: 233 10*3/uL (ref 150–400)
RBC: 5.49 MIL/uL (ref 4.22–5.81)
RDW: 13.1 % (ref 11.5–15.5)
WBC: 9.3 10*3/uL (ref 4.0–10.5)

## 2014-11-02 LAB — HEPATIC FUNCTION PANEL
ALBUMIN: 4.8 g/dL (ref 3.6–5.1)
ALK PHOS: 79 U/L (ref 40–115)
ALT: 52 U/L — AB (ref 9–46)
AST: 31 U/L (ref 10–40)
BILIRUBIN TOTAL: 0.6 mg/dL (ref 0.2–1.2)
Bilirubin, Direct: 0.1 mg/dL (ref <=0.2)
Indirect Bilirubin: 0.5 mg/dL (ref 0.2–1.2)
TOTAL PROTEIN: 7.7 g/dL (ref 6.1–8.1)

## 2014-11-02 LAB — BASIC METABOLIC PANEL
BUN: 11 mg/dL (ref 7–25)
CHLORIDE: 100 mmol/L (ref 98–110)
CO2: 26 mmol/L (ref 20–31)
Calcium: 9.4 mg/dL (ref 8.6–10.3)
Creat: 1.01 mg/dL (ref 0.60–1.35)
GLUCOSE: 86 mg/dL (ref 65–99)
POTASSIUM: 3.6 mmol/L (ref 3.5–5.3)
Sodium: 139 mmol/L (ref 135–146)

## 2014-11-02 NOTE — Progress Notes (Signed)
Pre visit review using our clinic review tool, if applicable. No additional management support is needed unless otherwise documented below in the visit note. 

## 2014-11-02 NOTE — Progress Notes (Signed)
   Subjective:    Patient ID: Dakota Reid, male    DOB: 1970/08/13, 44 y.o.   MRN: [id number]  HPI CPE- no concerns today.  Pt desires skin tag removal on L posterior flank/lateral back, L neck, R axilla.  Each is pedunculated w/ a stalk.  Reviewed procedure w/ pt, consent signed.  Pt is not interested in dermatopath.   Review of Systems Patient reports no vision/hearing changes, anorexia, fever ,adenopathy, persistant/recurrent hoarseness, swallowing issues, chest pain, palpitations, edema, persistant/recurrent cough, hemoptysis, dyspnea (rest,exertional, paroxysmal nocturnal), gastrointestinal  bleeding (melena, rectal bleeding), abdominal pain, excessive heart burn, GU symptoms (dysuria, hematuria, voiding/incontinence issues) syncope, focal weakness, memory loss, numbness & tingling, skin/hair/nail changes, depression, anxiety, abnormal bruising/bleeding, musculoskeletal symptoms/signs.     Objective:   Physical Exam General Appearance:    Alert, cooperative, no distress, appears stated age  Head:    Normocephalic, without obvious abnormality, atraumatic  Eyes:    PERRL, conjunctiva/corneas clear, EOM's intact, fundi    benign, both eyes       Ears:    Normal TM's and external ear canals, both ears  Nose:   Nares normal, septum midline, mucosa normal, no drainage   or sinus tenderness  Throat:   Lips, mucosa, and tongue normal; teeth and gums normal  Neck:   Supple, symmetrical, trachea midline, no adenopathy;       thyroid:  No enlargement/tenderness/nodules  Back:     Symmetric, no curvature, ROM normal, no CVA tenderness  Lungs:     Clear to auscultation bilaterally, respirations unlabored  Chest wall:    No tenderness or deformity  Heart:    Regular rate and rhythm, S1 and S2 normal, no murmur, rub   or gallop  Abdomen:     Soft, non-tender, bowel sounds active all four quadrants,    no masses, no organomegaly  Genitalia:    Normal male without lesion, masses,discharge or  tenderness  Rectal:    Deferred due to young age  Extremities:   Extremities normal, atraumatic, no cyanosis or edema  Pulses:   2+ and symmetric all extremities  Skin:   Skin color, texture, turgor normal, no rashes or lesions.  2 very small pedunculated skin tags on L neck, 1 large skin tag on L posterior flank/lateral back, and 1 skin tag in R axilla  Lymph nodes:   Cervical, supraclavicular, and axillary nodes normal  Neurologic:   CNII-XII intact. Normal strength, sensation and reflexes      Throughout  PROCEDURE NOTE- 3 skin tag areas were 1st cleaned w/ alcohol pads and then cleaned w/ betadine swabs.  Cold spray was applied to each by the CMA and MD, using gentle traction w/ forceps, snipped each skin tag at skin surface using sharp sterile scissors.  Pressure was applied w/ gauze and silver nitrate was used for cautery/hemostasis.  No oozing or bleeding noted after silver nitrate application.  abx ointment and bandaids applied.  Pt tolerated procedure w/o difficulty.          Assessment & Plan:

## 2014-11-02 NOTE — Assessment & Plan Note (Signed)
Pt's PE WNL w/ exception of obesity.  Encouraged healthy diet and regular exercise.  Check labs.  Anticipatory guidance provided.

## 2014-11-02 NOTE — Patient Instructions (Signed)
Follow up in 6 months to recheck cholesterol We'll notify you of your lab results and make any changes if needed Keep up the good work on healthy diet and regular exercise Keep skin tag areas clean and dry Apply antibiotic ointment to the wound sites- some oozing is to be expected (along w/ discoloration from the silver nitrate) but spreading redness and pus is not expected You will have a scab on the R flank- it had the thickest stalk- but the other 2 sites should not be noticeable Call with any questions or concerns Have a great weekend!! Happy Early Rudene Anda!!!

## 2014-11-02 NOTE — Assessment & Plan Note (Signed)
New.  Pt desired removal of 3 separate skin tag areas.  Consent obtained, procedure done- see note above, and wound care instructions provided.  Pt tolerated procedure w/o difficulty.

## 2014-11-03 LAB — TSH: TSH: 1.985 u[IU]/mL (ref 0.350–4.500)

## 2014-11-04 NOTE — Progress Notes (Signed)
This encounter was created in error - please disregard.

## 2014-11-05 ENCOUNTER — Other Ambulatory Visit: Payer: Self-pay | Admitting: General Practice

## 2014-11-05 MED ORDER — FENOFIBRATE 160 MG PO TABS: 160.0000 mg | ORAL_TABLET | Freq: Every day | ORAL | Status: DC

## 2015-02-13 ENCOUNTER — Other Ambulatory Visit: Payer: Self-pay | Admitting: Family Medicine

## 2015-02-13 NOTE — Telephone Encounter (Signed)
Medication filled to pharmacy as requested.   

## 2015-03-22 ENCOUNTER — Other Ambulatory Visit: Payer: Self-pay | Admitting: Family Medicine

## 2015-03-26 ENCOUNTER — Other Ambulatory Visit: Payer: Self-pay

## 2015-03-26 MED ORDER — ATORVASTATIN CALCIUM 20 MG PO TABS: 20.0000 mg | ORAL_TABLET | Freq: Every day | ORAL | Status: DC

## 2015-04-01 ENCOUNTER — Encounter: Payer: Self-pay | Admitting: Diagnostic Neuroimaging

## 2015-04-01 ENCOUNTER — Ambulatory Visit (INDEPENDENT_AMBULATORY_CARE_PROVIDER_SITE_OTHER): Payer: 59 | Admitting: Diagnostic Neuroimaging

## 2015-04-01 VITALS — BP 135/83 | HR 69 | Ht 67.0 in | Wt 209.4 lb

## 2015-04-01 DIAGNOSIS — F951 Chronic motor or vocal tic disorder: Secondary | ICD-10-CM

## 2015-04-01 MED ORDER — CLONIDINE HCL 0.1 MG PO TABS: 0.1000 mg | ORAL_TABLET | Freq: Two times a day (BID) | ORAL | Status: DC

## 2015-04-01 NOTE — Patient Instructions (Signed)
Thank you for coming to see [place] at [facility]. I hope we have been able to provide you high quality care today.  You may receive a patient satisfaction survey over the next few weeks. We would appreciate your feedback and comments so that we may continue to improve ourselves and the health of our patients.  - continue clonidine   ~~~~~~~~~~~~~~~~~~~~~~~~~~~~~~~~~~~~~~~~~~~~~~~~~~~~~~~~~~~~~~~~~  DR. PENUMALLI'S GUIDE TO HAPPY AND HEALTHY LIVING These are some of my general health and wellness recommendations. Some of them may apply to you better than others. Please use common sense as you try these suggestions and feel free to ask me any questions.   ACTIVITY/FITNESS Mental, social, emotional and physical stimulation are very important for brain and body health. Try learning a new activity (arts, music, language, sports, games).  Keep moving your body to the best of your abilities. You can do this at home, inside or outside, the park, community center, gym or anywhere you like. Consider a physical therapist or personal trainer to get started. Consider the app Sworkit. Fitness trackers such as smart-watches, smart-phones or Fitbits can help as well.   NUTRITION Eat more plants: colorful vegetables, nuts, seeds and berries.  Eat less sugar, salt, preservatives and processed foods.  Avoid toxins such as cigarettes and alcohol.  Drink water when you are thirsty. Warm water with a slice of lemon is an excellent morning drink to start the day.  Consider these websites for more information The Nutrition Source (https://www.henry-hernandez.biz/) Precision Nutrition (WindowBlog.ch)   RELAXATION Consider practicing mindfulness meditation or other relaxation techniques such as deep breathing, prayer, yoga, tai chi, massage. See website mindful.org or the apps Headspace or Calm to help get started.   SLEEP Try to get at least  7-8+ hours sleep per day. Regular exercise and reduced caffeine will help you sleep better. Practice good sleep hygeine techniques. See website sleep.org for more information.   PLANNING Prepare estate planning, living will, healthcare POA documents. Sometimes this is best planned with the help of an attorney. Theconversationproject.org and agingwithdignity.org are excellent resources.

## 2015-04-01 NOTE — Progress Notes (Signed)
[facility]  PATIENT: Dakota Reid DOB: 06-24-1970  REFERRING CLINICIAN:  HISTORY FROM: patient  REASON FOR VISIT: chronic tic disorder   HISTORICAL  CHIEF COMPLAINT:  Chief Complaint  Patient presents with  . Chronic motor or vocal tic disorder    rm 7  . Follow-up    6 month    HISTORY OF PRESENT ILLNESS:   UPDATE 04/01/15: Since last visit, doing well. Tics are essentially resolved. On clonidine 0.05 in AM and 0.1mg  in PM.   UPDATE 09/19/13: Since last visit, started on clonidine and tics have significantly reduced by 85-90%. He is satisfied with results. Doing well. BP is slightly up on persistent basis.   PRIOR 04/01/13: 45 year old right-handed male with hypertension, hypercholesteremia, here for evaluation of involuntary throat clicking movements and neck movements. Patient has had abnormal involuntary movements of his neck and head since he was 45 years old. These progressively got worse over time. He never sought medical attention for this. Symptoms are worse under stress and better if he has sleep. Symptoms and be better in the morning. Nowadays symptoms consist of intermittent throat clicking sounds, throat clearing, rapid movements of his head and right shoulder. No clear vocalizations with words. Patient try taking magnesium supplement 2 years ago which may have helped initially but no longer helps. Patient has some OCD traits according to the wife. No ADHD symptoms. His daughter recently was dx'd with OCD. There is family history of possible tic disorder in his uncle (eye blinking) and his mother (throat clearing)   REVIEW OF SYSTEMS: Full 14 system review of systems performed and notable only for freq waking daytime sleepiness.   ALLERGIES: No Known Allergies  HOME MEDICATIONS: Outpatient Prescriptions Prior to Visit  Medication Sig Dispense Refill  . atorvastatin (LIPITOR) 20 MG tablet Take 1 tablet (20 mg total) by mouth daily at 6 PM. 30  tablet 1  . fenofibrate 160 MG tablet TAKE 1 TABLET (160 MG TOTAL) BY MOUTH DAILY. 30 tablet 6  . fluticasone (FLONASE) 50 MCG/ACT nasal spray Place 2 sprays into both nostrils daily.  4  . multivitamin (ONE-A-DAY MEN'S) TABS Take 1 tablet by mouth daily.    . cloNIDine (CATAPRES) 0.1 MG tablet Take 1 tablet (0.1 mg total) by mouth 2 (two) times daily. 180 tablet 4  . atorvastatin (LIPITOR) 20 MG tablet Take 1 tablet by mouth  daily 30 tablet 0   No facility-administered medications prior to visit.    PAST MEDICAL HISTORY: Past Medical History  Diagnosis Date  . Hyperlipidemia   . Hypertension     PAST SURGICAL HISTORY: Past Surgical History  Procedure Laterality Date  . Wisdom tooth extraction      FAMILY HISTORY: Family History  Problem Relation Age of Onset  . Hypertension Father   . Kidney Stones Father   . Diabetes Mother   . Cancer Mother     cholangiocarcinoma  . Prostate cancer Paternal Grandfather   . Cancer Maternal Grandmother     colon  . Colon cancer Maternal Grandmother   . Lung cancer Maternal Grandmother     SOCIAL HISTORY:  Social History   Social History  . Marital Status: Married    Spouse Name: N/A  . Number of Children: 2  . Years of Education: BA   Occupational History  .      IT consultant   Social History Main Topics  . Smoking status: Former Smoker    Quit date: 03/17/2007  . Smokeless  tobacco: Never Used  . Alcohol Use: Yes     Comment: 1-2 beers per week  . Drug Use: No  . Sexual Activity: Not on file   Other Topics Concern  . Not on file   Social History Narrative   Married, twin girls.   Patient lives at home with his family.   Caffeine Use:      PHYSICAL EXAM  Filed Vitals:   04/01/15 0826  BP: 135/83  Pulse: 69  Height: 5\' 7"  (1.702 m)  Weight: 209 lb 6.4 oz (94.983 kg)    Not recorded      Body mass index is 32.79 kg/(m^2).   GENERAL EXAM: Patient is in no distress; well developed, nourished  and groomed; neck is supple  CARDIOVASCULAR: Regular rate and rhythm, no murmurs   NEUROLOGIC: MENTAL STATUS: awake, alert, language fluent, comprehension intact, naming intact, fund of knowledge appropriate CRANIAL NERVE: pupils equal and reactive to light, visual fields full to confrontation, extraocular muscles intact, no nystagmus, facial sensation and strength symmetric, hearing intact, palate elevates symmetrically, uvula midline, shoulder shrug symmetric, tongue midline. MOTOR: normal bulk and tone, full strength in the BUE, BLE SENSORY: normal and symmetric to light touch COORDINATION: finger-nose-finger, fine finger movements normal REFLEXES: deep tendon reflexes present and symmetric GAIT/STATION: narrow based gait; romberg is negative     DIAGNOSTIC DATA (LABS, IMAGING, TESTING) - I reviewed patient records, labs, notes, testing and imaging myself where available.  Lab Results  Component Value Date   WBC 9.3 11/02/2014   HGB 15.8 11/02/2014   HCT 44.3 11/02/2014   MCV 80.7 11/02/2014   PLT 233 11/02/2014      Component Value Date/Time   NA 139 11/02/2014 1550   K 3.6 11/02/2014 1550   CL 100 11/02/2014 1550   CO2 26 11/02/2014 1550   GLUCOSE 86 11/02/2014 1550   BUN 11 11/02/2014 1550   CREATININE 1.01 11/02/2014 1550   CREATININE 1.07 05/04/2014 0925   CALCIUM 9.4 11/02/2014 1550   PROT 7.7 11/02/2014 1550   ALBUMIN 4.8 11/02/2014 1550   AST 31 11/02/2014 1550   ALT 52* 11/02/2014 1550   ALKPHOS 79 11/02/2014 1550   BILITOT 0.6 11/02/2014 1550   GFRNONAA 105.09 08/15/2009 1052   Lab Results  Component Value Date   CHOL 179 11/02/2014   HDL 41 11/02/2014   LDLCALC 67 11/02/2014   LDLDIRECT 79.0 05/04/2014   TRIG 357* 11/02/2014   CHOLHDL 4.4 11/02/2014   Lab Results  Component Value Date   HGBA1C 5.5 09/15/2010   No results found for: [id number] Lab Results  Component Value Date   TSH 1.985 11/02/2014    I reviewed images myself and agree  with interpretation. -VRP  04/07/12 MRI brain - normal   ASSESSMENT AND PLAN  45 y.o. year old male here with chronic tic disorder. Doing well on clonidine.  PLAN: I spent 15 minutes of face to face time with patient. Greater than 50% of time was spent in counseling and coordination of care with patient. In summary we discussed:  - continue current medications - Monitor BP, reduce salt intake and increase physical activity.  Meds ordered this encounter  Medications  . cloNIDine (CATAPRES) 0.1 MG tablet    Sig: Take 1 tablet (0.1 mg total) by mouth 2 (two) times daily.    Dispense:  180 tablet    Refill:  4   Return in about 1 year (around 03/31/2016).    Penni Bombard, MD  [id number], 0000000 AM Certified in Neurology, Neurophysiology and [facility] [address] [place], [place] [address] [phone]

## 2015-05-06 ENCOUNTER — Encounter: Payer: Self-pay | Admitting: Family Medicine

## 2015-05-06 ENCOUNTER — Ambulatory Visit (INDEPENDENT_AMBULATORY_CARE_PROVIDER_SITE_OTHER): Payer: 59 | Admitting: Family Medicine

## 2015-05-06 VITALS — BP 138/92 | HR 63 | Temp 98.2°F | Ht 67.0 in | Wt 206.8 lb

## 2015-05-06 DIAGNOSIS — E785 Hyperlipidemia, unspecified: Secondary | ICD-10-CM | POA: Diagnosis not present

## 2015-05-06 DIAGNOSIS — N529 Male erectile dysfunction, unspecified: Secondary | ICD-10-CM | POA: Insufficient documentation

## 2015-05-06 DIAGNOSIS — E291 Testicular hypofunction: Secondary | ICD-10-CM | POA: Diagnosis not present

## 2015-05-06 DIAGNOSIS — R7989 Other specified abnormal findings of blood chemistry: Secondary | ICD-10-CM | POA: Insufficient documentation

## 2015-05-06 DIAGNOSIS — N521 Erectile dysfunction due to diseases classified elsewhere: Secondary | ICD-10-CM

## 2015-05-06 LAB — HEPATIC FUNCTION PANEL
ALBUMIN: 4.8 g/dL (ref 3.5–5.2)
ALT: 41 U/L (ref 0–53)
AST: 34 U/L (ref 0–37)
Alkaline Phosphatase: 44 U/L (ref 39–117)
Bilirubin, Direct: 0.2 mg/dL (ref 0.0–0.3)
TOTAL PROTEIN: 7.7 g/dL (ref 6.0–8.3)
Total Bilirubin: 0.8 mg/dL (ref 0.2–1.2)

## 2015-05-06 LAB — BASIC METABOLIC PANEL
BUN: 17 mg/dL (ref 6–23)
CHLORIDE: 101 meq/L (ref 96–112)
CO2: 27 mEq/L (ref 19–32)
Calcium: 9.5 mg/dL (ref 8.4–10.5)
Creatinine, Ser: 1.12 mg/dL (ref 0.40–1.50)
GFR: 76.24 mL/min (ref >60.00)
GLUCOSE: 95 mg/dL (ref 70–99)
POTASSIUM: 3.5 meq/L (ref 3.5–5.1)
Sodium: 138 mEq/L (ref 135–145)

## 2015-05-06 LAB — LIPID PANEL
CHOLESTEROL: 151 mg/dL (ref 0–200)
HDL: 42.4 mg/dL (ref >39.00)
LDL Cholesterol: 80 mg/dL (ref 0–99)
NonHDL: 108.3
TRIGLYCERIDES: 144 mg/dL (ref 0.0–149.0)
Total CHOL/HDL Ratio: 4
VLDL: 28.8 mg/dL (ref 0.0–40.0)

## 2015-05-06 MED ORDER — FENOFIBRATE 160 MG PO TABS: ORAL_TABLET | ORAL | Status: DC

## 2015-05-06 MED ORDER — TADALAFIL 5 MG PO TABS: 5.0000 mg | ORAL_TABLET | Freq: Every day | ORAL | Status: DC

## 2015-05-06 NOTE — Assessment & Plan Note (Signed)
New.  Start daily Cialis as this is pt's preference.  Prescription and coupon given.

## 2015-05-06 NOTE — Assessment & Plan Note (Signed)
Pt has hx of low T.  Was prescribed Androgel by Endo but this was too expensive for pt.  Encouraged him to check w/ insurance to determine if there is a preferred testosterone.  Will follow.

## 2015-05-06 NOTE — Assessment & Plan Note (Signed)
Chronic problem.  Tolerating Lipitor and fenofibrate w/o difficulty.  Check labs.  Adjust meds prn

## 2015-05-06 NOTE — Progress Notes (Signed)
   Subjective:    Patient ID: Dakota Reid, male    DOB: 02-18-1971, 45 y.o.   MRN: [id number]  HPI Hyperlipidemia- chronic problem, on Lipitor and Fenofibrate daily.  Has lost weight since last visit.  No CP, SOB, abd pain, N/V, myalgias.  ED- pt has known low T but was unable to afford the Androgel.  Reports he is having difficulty achieving and maintaining an erection.  Asking if there is something that can be done   Review of Systems For ROS see HPI     Objective:   Physical Exam  Constitutional: He is oriented to person, place, and time. He appears well-developed and well-nourished. No distress.  HENT:  Head: Normocephalic and atraumatic.  Eyes: Conjunctivae and EOM are normal. Pupils are equal, round, and reactive to light.  Neck: Normal range of motion. Neck supple. No thyromegaly present.  Cardiovascular: Normal rate, regular rhythm, normal heart sounds and intact distal pulses.   No murmur heard. Pulmonary/Chest: Effort normal and breath sounds normal. No respiratory distress.  Abdominal: Soft. Bowel sounds are normal. He exhibits no distension.  Musculoskeletal: He exhibits no edema.  Lymphadenopathy:    He has no cervical adenopathy.  Neurological: He is alert and oriented to person, place, and time. No cranial nerve deficit.  Skin: Skin is warm and dry.  Psychiatric: He has a normal mood and affect. His behavior is normal.  Vitals reviewed.         Assessment & Plan:

## 2015-05-06 NOTE — Progress Notes (Signed)
Pre visit review using our clinic review tool, if applicable. No additional management support is needed unless otherwise documented below in the visit note. 

## 2015-05-06 NOTE — Patient Instructions (Addendum)
Schedule your complete physical in 6 months We'll notify you of your lab results and make any changes if needed Continue to work on healthy diet and regular exercise Start the Cialis once daily Call and ask your insurance company if they have a preferred testosterone since the Androgel was so expensive Call with any questions or concerns If you want to join [place] at the new [place] office, any scheduled appointments will automatically transfer and we will see you at [address] [place] [address] [place], [place], [place] [address] [facility]) Have a great week!

## 2015-05-10 ENCOUNTER — Encounter: Payer: Self-pay | Admitting: Family Medicine

## 2015-05-14 ENCOUNTER — Telehealth: Payer: Self-pay | Admitting: General Practice

## 2015-05-14 NOTE — Telephone Encounter (Signed)
PA initiated for Cialis per PCP and patient. Began in covermymeds ([id number])

## 2015-05-22 NOTE — Telephone Encounter (Signed)
Called pt and LMOVM to return call.  °

## 2015-05-22 NOTE — Telephone Encounter (Signed)
Please let pt know that insurance will not cover once daily Cialis but we can certainly try the as needed dose (20mg ) and use 1/2 tab- which would be 6 doses/month

## 2015-05-22 NOTE — Telephone Encounter (Signed)
Received Denial on PA for Cialis 5mg  #30 for one month, patient's plan only allows them to receive #3 pills for one month. A higher quantity of Cialis can be approved if all of the following: 1) patient is using medication once daily for the Tx of BPH, 2) One of the following: a) patient has a history of failure following a trial of at least 4 weeks to an alpha-adrenergic blocking medication [doxazosin, tamsulosin, terazosin, silodosin, or alfuzosin OR b) patient has a history of contraindication or intolerance to an alpha-adrenergic blocking medication  Please Advise.

## 2015-05-24 ENCOUNTER — Encounter: Payer: Self-pay | Admitting: General Practice

## 2015-05-30 MED ORDER — TADALAFIL 20 MG PO TABS: 20.0000 mg | ORAL_TABLET | Freq: Every day | ORAL | Status: DC | PRN

## 2015-05-30 NOTE — Telephone Encounter (Signed)
Medication filled to pharmacy as requested.   

## 2015-07-17 ENCOUNTER — Other Ambulatory Visit: Payer: Self-pay | Admitting: Family Medicine

## 2015-07-17 NOTE — Telephone Encounter (Signed)
Medication filled to pharmacy as requested.   

## 2015-11-05 ENCOUNTER — Ambulatory Visit (INDEPENDENT_AMBULATORY_CARE_PROVIDER_SITE_OTHER): Payer: 59 | Admitting: Family Medicine

## 2015-11-05 ENCOUNTER — Encounter: Payer: Self-pay | Admitting: Family Medicine

## 2015-11-05 VITALS — BP 128/86 | HR 82 | Temp 98.2°F | Resp 16 | Ht 67.0 in | Wt 203.5 lb

## 2015-11-05 DIAGNOSIS — Z0000 Encounter for general adult medical examination without abnormal findings: Secondary | ICD-10-CM | POA: Diagnosis not present

## 2015-11-05 LAB — LIPID PANEL
CHOL/HDL RATIO: 3
Cholesterol: 171 mg/dL (ref 0–200)
HDL: 50.6 mg/dL (ref >39.00)
LDL Cholesterol: 95 mg/dL (ref 0–99)
NONHDL: 120.5
Triglycerides: 128 mg/dL (ref 0.0–149.0)
VLDL: 25.6 mg/dL (ref 0.0–40.0)

## 2015-11-05 LAB — BASIC METABOLIC PANEL
BUN: 16 mg/dL (ref 6–23)
CHLORIDE: 103 meq/L (ref 96–112)
CO2: 28 mEq/L (ref 19–32)
CREATININE: 1.23 mg/dL (ref 0.40–1.50)
Calcium: 9.7 mg/dL (ref 8.4–10.5)
GFR: 68.27 mL/min (ref >60.00)
Glucose, Bld: 103 mg/dL — ABNORMAL HIGH (ref 70–99)
POTASSIUM: 4.3 meq/L (ref 3.5–5.1)
SODIUM: 140 meq/L (ref 135–145)

## 2015-11-05 LAB — CBC WITH DIFFERENTIAL/PLATELET
BASOS ABS: 0 10*3/uL (ref 0.0–0.1)
Basophils Relative: 0.4 % (ref 0.0–3.0)
EOS PCT: 2.1 % (ref 0.0–5.0)
Eosinophils Absolute: 0.2 10*3/uL (ref 0.0–0.7)
HEMATOCRIT: 44 % (ref 39.0–52.0)
Hemoglobin: 15.1 g/dL (ref 13.0–17.0)
LYMPHS PCT: 31.8 % (ref 12.0–46.0)
Lymphs Abs: 2.8 10*3/uL (ref 0.7–4.0)
MCHC: 34.3 g/dL (ref 30.0–36.0)
MCV: 83.6 fl (ref 78.0–100.0)
MONOS PCT: 7.2 % (ref 3.0–12.0)
Monocytes Absolute: 0.6 10*3/uL (ref 0.1–1.0)
NEUTROS ABS: 5.2 10*3/uL (ref 1.4–7.7)
Neutrophils Relative %: 58.5 % (ref 43.0–77.0)
PLATELETS: 274 10*3/uL (ref 150.0–400.0)
RBC: 5.26 Mil/uL (ref 4.22–5.81)
RDW: 12.9 % (ref 11.5–15.5)
WBC: 8.9 10*3/uL (ref 4.0–10.5)

## 2015-11-05 LAB — HEPATIC FUNCTION PANEL
ALT: 45 U/L (ref 0–53)
AST: 29 U/L (ref 0–37)
Albumin: 5.2 g/dL (ref 3.5–5.2)
Alkaline Phosphatase: 49 U/L (ref 39–117)
BILIRUBIN DIRECT: 0.2 mg/dL (ref 0.0–0.3)
BILIRUBIN TOTAL: 0.6 mg/dL (ref 0.2–1.2)
TOTAL PROTEIN: 7.9 g/dL (ref 6.0–8.3)

## 2015-11-05 LAB — TSH: TSH: 1.39 u[IU]/mL (ref 0.35–4.50)

## 2015-11-05 MED ORDER — FENOFIBRATE 160 MG PO TABS: ORAL_TABLET | ORAL | 1 refills | Status: DC

## 2015-11-05 NOTE — Assessment & Plan Note (Signed)
Pt's PE WNL.  UTD on immunizations.  Check labs.  Anticipatory guidance provided.  

## 2015-11-05 NOTE — Progress Notes (Signed)
Pre visit review using our clinic review tool, if applicable. No additional management support is needed unless otherwise documented below in the visit note. 

## 2015-11-05 NOTE — Patient Instructions (Signed)
Follow up in 6 months to recheck cholesterol We'll notify you of your lab results and make any changes if needed Continue to work on healthy diet and regular exercise- you can do it! Call with any questions or concerns Happy Early Birthday!!! 

## 2015-11-05 NOTE — Progress Notes (Signed)
   Subjective:    Patient ID: Dakota Reid, male    DOB: [birth year], 45 y.o.   MRN: [id number]  HPI CPE- no concerns today.   Review of Systems Patient reports no vision/hearing changes, anorexia, fever ,adenopathy, persistant/recurrent hoarseness, swallowing issues, chest pain, palpitations, edema, persistant/recurrent cough, hemoptysis, dyspnea (rest,exertional, paroxysmal nocturnal), gastrointestinal  bleeding (melena, rectal bleeding), abdominal pain, excessive heart burn, GU symptoms (dysuria, hematuria, voiding/incontinence issues) syncope, focal weakness, memory loss, numbness & tingling, skin/hair/nail changes, depression, anxiety, abnormal bruising/bleeding, musculoskeletal symptoms/signs.     Objective:   Physical Exam General Appearance:    Alert, cooperative, no distress, appears stated age  Head:    Normocephalic, without obvious abnormality, atraumatic  Eyes:    PERRL, conjunctiva/corneas clear, EOM's intact, fundi    benign, both eyes       Ears:    Normal TM's and external ear canals, both ears  Nose:   Nares normal, septum midline, mucosa normal, no drainage   or sinus tenderness  Throat:   Lips, mucosa, and tongue normal; teeth and gums normal  Neck:   Supple, symmetrical, trachea midline, no adenopathy;       thyroid:  No enlargement/tenderness/nodules  Back:     Symmetric, no curvature, ROM normal, no CVA tenderness  Lungs:     Clear to auscultation bilaterally, respirations unlabored  Chest wall:    No tenderness or deformity  Heart:    Regular rate and rhythm, S1 and S2 normal, no murmur, rub   or gallop  Abdomen:     Soft, non-tender, bowel sounds active all four quadrants,    no masses, no organomegaly  Genitalia:    Normal male without lesion, masses,discharge or tenderness  Rectal:    Deferred due to young age  Extremities:   Extremities normal, atraumatic, no cyanosis or edema  Pulses:   2+ and symmetric all extremities  Skin:   Skin color, texture, turgor  normal, no rashes or lesions  Lymph nodes:   Cervical, supraclavicular, and axillary nodes normal  Neurologic:   CNII-XII intact. Normal strength, sensation and reflexes      throughout          Assessment & Plan:

## 2016-01-03 ENCOUNTER — Telehealth: Payer: Self-pay | Admitting: Family Medicine

## 2016-01-03 NOTE — Telephone Encounter (Signed)
Called pt and left a detailed message and advised that per PCP he NEEDS to be seen in the ED. He can either have someone transport him or call 911 for EMS transport.

## 2016-01-03 NOTE — Telephone Encounter (Signed)
Agree w/ advice given x2.  Pt needs ER evaluation

## 2016-01-03 NOTE — Telephone Encounter (Signed)
Patient Name: Dakota Reid DOB: 11-10-1970 Initial Comment Caller states he has pressure in his chest, to his back, and under his left arm. Had to take deep breaths to get breathing right. Started last week with headaches, and neck pain. Today feels like pressure on his chest. Nurse Assessment Nurse: Ronnald Ramp, RN, Miranda Date/Time (Eastern Time): 01/03/2016 8:41:08 AM Confirm and document reason for call. If symptomatic, describe symptoms. You must click the next button to save text entered. ---Caller he started having pressure in his chest a couple of days ago. Yesterday the pain was much worse and had some trouble taking a deep breath. Today he still has some discomfort in the chest but not as intense. The pain radiates through his chest to his back. Has the patient traveled out of the country within the last 30 days? ---Not Applicable Does the patient have any new or worsening symptoms? ---Yes Will a triage be completed? ---Yes Related visit to physician within the last 2 weeks? ---No Does the PT have any chronic conditions? (i.e. diabetes, asthma, etc.) ---Yes List chronic conditions. ---HTN, High Cholesterol Is this a behavioral health or substance abuse call? ---No Guidelines Guideline Title Affirmed Question Affirmed Notes Chest Pain [1] Chest pain lasts > 5 minutes AND [2] age > 69 AND [3] at least one cardiac risk factor (i.e., hypertension, diabetes, obesity, smoker or strong family history of heart disease) Final Disposition User Call EMS 911 Now Ronnald Ramp, RN, Miranda Referrals GO TO FACILITY OTHER - SPECIFY Disagree/Comply: Disagree Disagree/Comply Reason: Disagree with instructions

## 2016-01-27 ENCOUNTER — Encounter: Payer: Self-pay | Admitting: Family Medicine

## 2016-01-28 MED ORDER — ATORVASTATIN CALCIUM 20 MG PO TABS: ORAL_TABLET | ORAL | 1 refills | Status: DC

## 2016-03-21 ENCOUNTER — Other Ambulatory Visit: Payer: Self-pay | Admitting: Family Medicine

## 2016-03-30 ENCOUNTER — Ambulatory Visit: Payer: 59 | Admitting: Diagnostic Neuroimaging

## 2016-04-10 ENCOUNTER — Telehealth: Payer: Self-pay | Admitting: Diagnostic Neuroimaging

## 2016-04-10 MED ORDER — CLONIDINE HCL 0.1 MG PO TABS: 0.1000 mg | ORAL_TABLET | Freq: Two times a day (BID) | ORAL | 4 refills | Status: DC

## 2016-04-10 NOTE — Telephone Encounter (Signed)
Patient requesting refill for cloNIDine (CATAPRES) 0.1 MG tablet.

## 2016-04-10 NOTE — Addendum Note (Signed)
Addended by: Minna Antis on: 04/10/2016 08:38 AM   Modules accepted: Orders

## 2016-05-05 ENCOUNTER — Encounter: Payer: Self-pay | Admitting: General Practice

## 2016-05-20 ENCOUNTER — Encounter: Payer: Self-pay | Admitting: Diagnostic Neuroimaging

## 2016-06-15 ENCOUNTER — Ambulatory Visit: Payer: 59 | Admitting: Diagnostic Neuroimaging

## 2016-07-29 ENCOUNTER — Encounter: Payer: Self-pay | Admitting: Diagnostic Neuroimaging

## 2016-07-29 ENCOUNTER — Ambulatory Visit (INDEPENDENT_AMBULATORY_CARE_PROVIDER_SITE_OTHER): Payer: 59 | Admitting: Diagnostic Neuroimaging

## 2016-07-29 VITALS — BP 132/81 | HR 61 | Wt 201.8 lb

## 2016-07-29 DIAGNOSIS — G4733 Obstructive sleep apnea (adult) (pediatric): Secondary | ICD-10-CM

## 2016-07-29 DIAGNOSIS — F951 Chronic motor or vocal tic disorder: Secondary | ICD-10-CM | POA: Diagnosis not present

## 2016-07-29 MED ORDER — CLONIDINE HCL 0.1 MG PO TABS: 0.1000 mg | ORAL_TABLET | Freq: Two times a day (BID) | ORAL | 4 refills | Status: DC

## 2016-07-29 NOTE — Patient Instructions (Signed)
-   continue clonidine  - monitor BP, reduce salt intake and increase physical activity  - follow up with sleep study results (mild OSA)

## 2016-07-29 NOTE — Progress Notes (Signed)
[facility]  PATIENT: Dakota Reid DOB: 1970-03-26  REFERRING CLINICIAN:  HISTORY FROM: patient  REASON FOR VISIT: chronic tic disorder   HISTORICAL  CHIEF COMPLAINT:  Chief Complaint  Patient presents with  . Chronic motor or vocal tic disorder    rm 6, "doing well, no concerns; prescription routine is working well"  . Follow-up    one year    HISTORY OF PRESENT ILLNESS:   UPDATE 07/29/16: Since last visit, doing well. Tics are under control. Same dose on clonidine.   UPDATE 04/01/15: Since last visit, doing well. Tics are essentially resolved. On clonidine 0.05mg  in AM and 0.1mg  in PM.   UPDATE 09/19/13: Since last visit, started on clonidine and tics have significantly reduced by 85-90%. He is satisfied with results. Doing well. BP is slightly up on persistent basis.   PRIOR 04/01/13: 46 year old right-handed male with hypertension, hypercholesteremia, here for evaluation of involuntary throat clicking movements and neck movements. Patient has had abnormal involuntary movements of his neck and head since he was 46 years old. These progressively got worse over time. He never sought medical attention for this. Symptoms are worse under stress and better if he has sleep. Symptoms and be better in the morning. Nowadays symptoms consist of intermittent throat clicking sounds, throat clearing, rapid movements of his head and right shoulder. No clear vocalizations with words. Patient try taking magnesium supplement 2 years ago which may have helped initially but no longer helps. Patient has some OCD traits according to the wife. No ADHD symptoms. His daughter recently was dx'd with OCD. There is family history of possible tic disorder in his uncle (eye blinking) and his mother (throat clearing)   REVIEW OF SYSTEMS: Full 14 system review of systems performed and notable only for freq waking daytime sleepiness.   ALLERGIES: No Known Allergies  HOME  MEDICATIONS: Outpatient Medications Prior to Visit  Medication Sig Dispense Refill  . atorvastatin (LIPITOR) 20 MG tablet TAKE 1 TABLET BY MOUTH  DAILY AT 6 PM. 90 tablet 1  . CIALIS 5 MG tablet TAKE 1 TABLET BY MOUTH EVERY DAY (MAX 3 TABS/31 DAYS PER INS)  6  . cloNIDine (CATAPRES) 0.1 MG tablet Take 1 tablet (0.1 mg total) by mouth 2 (two) times daily. 180 tablet 4  . fenofibrate 160 MG tablet TAKE 1 TABLET BY MOUTH  DAILY 90 tablet 1  . fluticasone (FLONASE) 50 MCG/ACT nasal spray Place 2 sprays into both nostrils daily.  4  . multivitamin (ONE-A-DAY MEN'S) TABS Take 1 tablet by mouth daily.     No facility-administered medications prior to visit.     PAST MEDICAL HISTORY: Past Medical History:  Diagnosis Date  . Hyperlipidemia   . Hypertension     PAST SURGICAL HISTORY: Past Surgical History:  Procedure Laterality Date  . WISDOM TOOTH EXTRACTION      FAMILY HISTORY: Family History  Problem Relation Age of Onset  . Hypertension Father   . Kidney Stones Father   . Diabetes Mother   . Cancer Mother        cholangiocarcinoma  . Prostate cancer Paternal Grandfather   . Cancer Maternal Grandmother        colon  . Colon cancer Maternal Grandmother   . Lung cancer Maternal Grandmother     SOCIAL HISTORY:  Social History   Social History  . Marital status: Married    Spouse name: N/A  . Number of children: 2  . Years of education: BA  Occupational History  .  [employer]   Social History Main Topics  . Smoking status: Former Smoker    Quit date: 03/17/2007  . Smokeless tobacco: Never Used  . Alcohol use Yes     Comment: 2-3 beers per week  . Drug use: No  . Sexual activity: Not on file   Other Topics Concern  . Not on file   Social History Narrative   Married, twin girls.   Patient lives at home with his family.   Caffeine Use:      PHYSICAL EXAM  Vitals:   07/29/16 0815  BP: 132/81  Pulse: 61  Weight: 201 lb 12.8  oz (91.5 kg)    Not recorded     Wt Readings from Last 3 Encounters:  07/29/16 201 lb 12.8 oz (91.5 kg)  11/05/15 203 lb 8 oz (92.3 kg)  05/06/15 206 lb 12.8 oz (93.8 kg)   Body mass index is 31.61 kg/m.  GENERAL EXAM: Patient is in no distress; well developed, nourished and groomed; neck is supple  CARDIOVASCULAR: Regular rate and rhythm, no murmurs   NEUROLOGIC: MENTAL STATUS: awake, alert, language fluent, comprehension intact, naming intact, fund of knowledge appropriate CRANIAL NERVE: pupils equal and reactive to light, visual fields full to confrontation, extraocular muscles intact, no nystagmus, facial sensation and strength symmetric, hearing intact, palate elevates symmetrically, uvula midline, shoulder shrug symmetric, tongue midline. MOTOR: normal bulk and tone, full strength in the BUE, BLE SENSORY: normal and symmetric to light touch COORDINATION: finger-nose-finger, fine finger movements normal REFLEXES: deep tendon reflexes present and symmetric GAIT/STATION: narrow based gait; romberg is negative     DIAGNOSTIC DATA (LABS, IMAGING, TESTING) - I reviewed patient records, labs, notes, testing and imaging myself where available.  Lab Results  Component Value Date   WBC 8.9 11/05/2015   HGB 15.1 11/05/2015   HCT 44.0 11/05/2015   MCV 83.6 11/05/2015   PLT 274.0 11/05/2015      Component Value Date/Time   NA 140 11/05/2015 0941   K 4.3 11/05/2015 0941   CL 103 11/05/2015 0941   CO2 28 11/05/2015 0941   GLUCOSE 103 (H) 11/05/2015 0941   BUN 16 11/05/2015 0941   CREATININE 1.23 11/05/2015 0941   CREATININE 1.01 11/02/2014 1550   CALCIUM 9.7 11/05/2015 0941   PROT 7.9 11/05/2015 0941   ALBUMIN 5.2 11/05/2015 0941   AST 29 11/05/2015 0941   ALT 45 11/05/2015 0941   ALKPHOS 49 11/05/2015 0941   BILITOT 0.6 11/05/2015 0941   GFRNONAA 105.09 08/15/2009 1052   Lab Results  Component Value Date   CHOL 171 11/05/2015   HDL 50.60 11/05/2015   LDLCALC  95 11/05/2015   LDLDIRECT 79.0 05/04/2014   TRIG 128.0 11/05/2015   CHOLHDL 3 11/05/2015   Lab Results  Component Value Date   HGBA1C 5.5 09/15/2010   No results found for: VITAMINB12 Lab Results  Component Value Date   TSH 1.39 11/05/2015    04/07/12 MRI brain - normal  04/21/14 PSG - IMPRESSION:  Mild obstructive sleep apnea/hypopnea syndrome, AHI 13.9 per hour. 97 total events scored including 11 central apneas, 14 obstructive apneas, 14 mixed apneas, 58 hypopneas. Oxygen desaturation to a nadir of 87% and average oxygen saturation 93% on room air. Mean heart rate 62.9/m. - RECOMMENDATION:  Treatment for scores in this range most commonly starts with CPAP unless another approach is more appropriate on an individual basis.  ASSESSMENT AND PLAN  46 y.o. year old male here with chronic tic disorder. Doing well on clonidine.    Dx:  Chronic motor or vocal tic disorder  OSA (obstructive sleep apnea)    PLAN: I spent 15 minutes of face to face time with patient. Greater than 50% of time was spent in counseling and coordination of care with patient. In summary we discussed:  - continue clonidine - monitor BP, reduce salt intake and increase physical activity - follow up with sleep study results (mild OSA dx'd in 2016 by Dr. Annamaria Boots)  Meds ordered this encounter  Medications  . cloNIDine (CATAPRES) 0.1 MG tablet    Sig: Take 1 tablet (0.1 mg total) by mouth 2 (two) times daily.    Dispense:  180 tablet    Refill:  4   Return in about 1 year (around 07/29/2017).    Penni Bombard, MD 3/70/4888, 9:16 AM Certified in Neurology, Neurophysiology and Neuroimaging  [facility] [address] [place], [place] [address] [phone]

## 2016-08-12 ENCOUNTER — Other Ambulatory Visit: Payer: Self-pay | Admitting: Family Medicine

## 2016-08-13 ENCOUNTER — Encounter: Payer: Self-pay | Admitting: General Practice

## 2016-08-13 MED ORDER — ATORVASTATIN CALCIUM 20 MG PO TABS: ORAL_TABLET | ORAL | 0 refills | Status: DC

## 2016-09-24 ENCOUNTER — Encounter: Payer: Self-pay | Admitting: General Practice

## 2016-09-24 ENCOUNTER — Ambulatory Visit (INDEPENDENT_AMBULATORY_CARE_PROVIDER_SITE_OTHER): Payer: 59 | Admitting: Family Medicine

## 2016-09-24 ENCOUNTER — Other Ambulatory Visit: Payer: Self-pay | Admitting: Family Medicine

## 2016-09-24 ENCOUNTER — Encounter: Payer: Self-pay | Admitting: Family Medicine

## 2016-09-24 VITALS — BP 120/78 | HR 67 | Temp 98.0°F | Resp 16 | Ht 67.0 in | Wt 205.0 lb

## 2016-09-24 DIAGNOSIS — E785 Hyperlipidemia, unspecified: Secondary | ICD-10-CM

## 2016-09-24 DIAGNOSIS — E669 Obesity, unspecified: Secondary | ICD-10-CM | POA: Insufficient documentation

## 2016-09-24 LAB — BASIC METABOLIC PANEL
BUN: 18 mg/dL (ref 6–23)
CO2: 30 mEq/L (ref 19–32)
CREATININE: 1.27 mg/dL (ref 0.40–1.50)
Calcium: 9.7 mg/dL (ref 8.4–10.5)
Chloride: 104 mEq/L (ref 96–112)
GFR: 65.52 mL/min (ref >60.00)
GLUCOSE: 99 mg/dL (ref 70–99)
Potassium: 4.5 mEq/L (ref 3.5–5.1)
Sodium: 141 mEq/L (ref 135–145)

## 2016-09-24 LAB — HEPATIC FUNCTION PANEL
ALBUMIN: 4.8 g/dL (ref 3.5–5.2)
ALK PHOS: 41 U/L (ref 39–117)
ALT: 44 U/L (ref 0–53)
AST: 28 U/L (ref 0–37)
Bilirubin, Direct: 0.2 mg/dL (ref 0.0–0.3)
Total Bilirubin: 0.7 mg/dL (ref 0.2–1.2)
Total Protein: 7.2 g/dL (ref 6.0–8.3)

## 2016-09-24 LAB — LIPID PANEL
CHOLESTEROL: 161 mg/dL (ref 0–200)
HDL: 46.7 mg/dL (ref >39.00)
LDL CALC: 83 mg/dL (ref 0–99)
NONHDL: 114.21
Total CHOL/HDL Ratio: 3
Triglycerides: 155 mg/dL — ABNORMAL HIGH (ref 0.0–149.0)
VLDL: 31 mg/dL (ref 0.0–40.0)

## 2016-09-24 NOTE — Progress Notes (Signed)
Pre visit review using our clinic review tool, if applicable. No additional management support is needed unless otherwise documented below in the visit note. 

## 2016-09-24 NOTE — Progress Notes (Signed)
   Subjective:    Patient ID: Dakota Reid, male    DOB: December 11, 1970, 46 y.o.   MRN: [id number]  HPI Hyperlipidemia- chronic problem, on Lipitor and Fenofibrate.  No regular exercise.  No CP, SOB, HAs, visual changes, edema, abd pain, N/V.  Obesity- ongoing issue.  Pt has gained 3 lbs since May.     Review of Systems For ROS see HPI     Objective:   Physical Exam  Constitutional: He is oriented to person, place, and time. He appears well-developed and well-nourished. No distress.  HENT:  Head: Normocephalic and atraumatic.  Eyes: Pupils are equal, round, and reactive to light. Conjunctivae and EOM are normal.  Neck: Normal range of motion. Neck supple. No thyromegaly present.  Cardiovascular: Normal rate, regular rhythm, normal heart sounds and intact distal pulses.   No murmur heard. Pulmonary/Chest: Effort normal and breath sounds normal. No respiratory distress.  Abdominal: Soft. Bowel sounds are normal. He exhibits no distension.  Musculoskeletal: He exhibits no edema.  Lymphadenopathy:    He has no cervical adenopathy.  Neurological: He is alert and oriented to person, place, and time. No cranial nerve deficit.  Skin: Skin is warm and dry.  Psychiatric: He has a normal mood and affect. His behavior is normal.  Vitals reviewed.         Assessment & Plan:

## 2016-09-24 NOTE — Assessment & Plan Note (Signed)
Chronic problem.  Tolerating Lipitor and Fenofibrate w/o difficulty.  Check labs.  Adjust meds prn

## 2016-09-24 NOTE — Patient Instructions (Signed)
Follow up as scheduled for your CPE We'll notify you of your lab results and make any changes if needed Continue to work on healthy diet and regular exercise- you can do it!!! Call with any questions or concerns Have a great summer!!!

## 2016-09-24 NOTE — Assessment & Plan Note (Signed)
Chronic problem.  Pt is not exercising.  Stressed the need for both regular exercise and healthy diet.  Will continue to follow.

## 2016-10-03 ENCOUNTER — Other Ambulatory Visit: Payer: Self-pay | Admitting: Family Medicine

## 2016-11-26 ENCOUNTER — Ambulatory Visit (INDEPENDENT_AMBULATORY_CARE_PROVIDER_SITE_OTHER): Payer: 59 | Admitting: Family Medicine

## 2016-11-26 ENCOUNTER — Encounter: Payer: Self-pay | Admitting: Family Medicine

## 2016-11-26 VITALS — BP 123/73 | HR 90 | Temp 98.1°F | Resp 16 | Ht 67.0 in | Wt 207.4 lb

## 2016-11-26 DIAGNOSIS — G4733 Obstructive sleep apnea (adult) (pediatric): Secondary | ICD-10-CM

## 2016-11-26 DIAGNOSIS — Z23 Encounter for immunization: Secondary | ICD-10-CM

## 2016-11-26 DIAGNOSIS — Z0000 Encounter for general adult medical examination without abnormal findings: Secondary | ICD-10-CM | POA: Diagnosis not present

## 2016-11-26 DIAGNOSIS — E785 Hyperlipidemia, unspecified: Secondary | ICD-10-CM

## 2016-11-26 LAB — BASIC METABOLIC PANEL
BUN: 15 mg/dL (ref 6–23)
CO2: 31 mEq/L (ref 19–32)
CREATININE: 1.26 mg/dL (ref 0.40–1.50)
Calcium: 9.9 mg/dL (ref 8.4–10.5)
Chloride: 102 mEq/L (ref 96–112)
GFR: 66.07 mL/min (ref >60.00)
GLUCOSE: 96 mg/dL (ref 70–99)
POTASSIUM: 4.1 meq/L (ref 3.5–5.1)
Sodium: 141 mEq/L (ref 135–145)

## 2016-11-26 LAB — CBC WITH DIFFERENTIAL/PLATELET
BASOS ABS: 0 10*3/uL (ref 0.0–0.1)
Basophils Relative: 0.4 % (ref 0.0–3.0)
EOS PCT: 1.5 % (ref 0.0–5.0)
Eosinophils Absolute: 0.1 10*3/uL (ref 0.0–0.7)
HCT: 46.6 % (ref 39.0–52.0)
HEMOGLOBIN: 15.8 g/dL (ref 13.0–17.0)
LYMPHS ABS: 2.4 10*3/uL (ref 0.7–4.0)
Lymphocytes Relative: 27.6 % (ref 12.0–46.0)
MCHC: 33.8 g/dL (ref 30.0–36.0)
MCV: 83.8 fl (ref 78.0–100.0)
MONO ABS: 0.8 10*3/uL (ref 0.1–1.0)
MONOS PCT: 8.5 % (ref 3.0–12.0)
NEUTROS PCT: 62 % (ref 43.0–77.0)
Neutro Abs: 5.5 10*3/uL (ref 1.4–7.7)
Platelets: 289 10*3/uL (ref 150.0–400.0)
RBC: 5.57 Mil/uL (ref 4.22–5.81)
RDW: 13 % (ref 11.5–15.5)
WBC: 8.8 10*3/uL (ref 4.0–10.5)

## 2016-11-26 LAB — LDL CHOLESTEROL, DIRECT: LDL DIRECT: 95 mg/dL

## 2016-11-26 LAB — LIPID PANEL
CHOL/HDL RATIO: 3
CHOLESTEROL: 181 mg/dL (ref 0–200)
HDL: 58 mg/dL (ref >39.00)
NonHDL: 122.83
TRIGLYCERIDES: 304 mg/dL — AB (ref 0.0–149.0)
VLDL: 60.8 mg/dL — AB (ref 0.0–40.0)

## 2016-11-26 LAB — HEPATIC FUNCTION PANEL
ALBUMIN: 5.1 g/dL (ref 3.5–5.2)
ALK PHOS: 44 U/L (ref 39–117)
ALT: 52 U/L (ref 0–53)
AST: 30 U/L (ref 0–37)
Bilirubin, Direct: 0.1 mg/dL (ref 0.0–0.3)
TOTAL PROTEIN: 7.5 g/dL (ref 6.0–8.3)
Total Bilirubin: 0.6 mg/dL (ref 0.2–1.2)

## 2016-11-26 LAB — TSH: TSH: 1.22 u[IU]/mL (ref 0.35–4.50)

## 2016-11-26 NOTE — Progress Notes (Signed)
   Subjective:    Patient ID: Dakota Reid, male    DOB: Nov 08, 1970, 46 y.o.   MRN: [id number]  HPI CPE- UTD on Tdap, flu today.  No concerns.   Review of Systems Patient reports no vision/hearing changes, anorexia, fever ,adenopathy, persistant/recurrent hoarseness, swallowing issues, chest pain, palpitations, edema, persistant/recurrent cough, hemoptysis, dyspnea (rest,exertional, paroxysmal nocturnal), gastrointestinal  bleeding (melena, rectal bleeding), abdominal pain, excessive heart burn, GU symptoms (dysuria, hematuria, voiding/incontinence issues) syncope, focal weakness, memory loss, numbness & tingling, skin/hair/nail changes, depression, anxiety, abnormal bruising/bleeding, musculoskeletal symptoms/signs.     Objective:   Physical Exam General Appearance:    Alert, cooperative, no distress, appears stated age  Head:    Normocephalic, without obvious abnormality, atraumatic  Eyes:    PERRL, conjunctiva/corneas clear, EOM's intact, fundi    benign, both eyes       Ears:    Normal TM's and external ear canals, both ears  Nose:   Nares normal, septum midline, mucosa normal, no drainage   or sinus tenderness  Throat:   Lips, mucosa, and tongue normal; teeth and gums normal  Neck:   Supple, symmetrical, trachea midline, no adenopathy;       thyroid:  No enlargement/tenderness/nodules  Back:     Symmetric, no curvature, ROM normal, no CVA tenderness  Lungs:     Clear to auscultation bilaterally, respirations unlabored  Chest wall:    No tenderness or deformity  Heart:    Regular rate and rhythm, S1 and S2 normal, no murmur, rub   or gallop  Abdomen:     Soft, non-tender, bowel sounds active all four quadrants,    no masses, no organomegaly  Genitalia:    Normal male without lesion, masses,discharge or tenderness  Rectal:    Deferred due to young age  Extremities:   Extremities normal, atraumatic, no cyanosis or edema  Pulses:   2+ and symmetric all extremities  Skin:   Skin  color, texture, turgor normal, no rashes or lesions  Lymph nodes:   Cervical, supraclavicular, and axillary nodes normal  Neurologic:   CNII-XII intact. Normal strength, sensation and reflexes      throughout          Assessment & Plan:

## 2016-11-26 NOTE — Assessment & Plan Note (Signed)
Chronic problem.  Tolerating statin w/o difficulty.  Check labs.  Adjust meds prn  

## 2016-11-26 NOTE — Patient Instructions (Signed)
Follow up in 6 months to recheck cholesterol We'll notify you of your lab results and make any changes Continue to work on healthy diet and regular exercise- you can do it!!! We'll call you with your pulmonary appt for the possible sleep apnea Call with any questions or concerns Stay Safe!!!

## 2016-11-26 NOTE — Assessment & Plan Note (Signed)
Refer to pulm to review his previous sleep study ordered by ENT as pt reports he continues to wake poorly rested.

## 2016-11-26 NOTE — Progress Notes (Signed)
Pre visit review using our clinic review tool, if applicable. No additional management support is needed unless otherwise documented below in the visit note. 

## 2016-11-26 NOTE — Assessment & Plan Note (Signed)
Pt's PE WNL w/ exception of obesity.  Check labs.  Flu shot given.  Anticipatory guidance provided.

## 2016-11-30 ENCOUNTER — Encounter: Payer: Self-pay | Admitting: General Practice

## 2017-03-09 ENCOUNTER — Other Ambulatory Visit: Payer: Self-pay | Admitting: Family Medicine

## 2017-03-19 ENCOUNTER — Other Ambulatory Visit: Payer: Self-pay | Admitting: Family Medicine

## 2017-05-28 ENCOUNTER — Ambulatory Visit (INDEPENDENT_AMBULATORY_CARE_PROVIDER_SITE_OTHER): Payer: 59 | Admitting: Family Medicine

## 2017-05-28 ENCOUNTER — Other Ambulatory Visit: Payer: Self-pay

## 2017-05-28 ENCOUNTER — Encounter: Payer: Self-pay | Admitting: Family Medicine

## 2017-05-28 VITALS — BP 120/72 | HR 66 | Temp 98.2°F | Resp 16 | Ht 67.0 in | Wt 210.4 lb

## 2017-05-28 DIAGNOSIS — E785 Hyperlipidemia, unspecified: Secondary | ICD-10-CM

## 2017-05-28 LAB — BASIC METABOLIC PANEL
BUN: 17 mg/dL (ref 6–23)
CALCIUM: 9.9 mg/dL (ref 8.4–10.5)
CO2: 30 meq/L (ref 19–32)
Chloride: 102 mEq/L (ref 96–112)
Creatinine, Ser: 1.22 mg/dL (ref 0.40–1.50)
GFR: 68.42 mL/min (ref >60.00)
Glucose, Bld: 110 mg/dL — ABNORMAL HIGH (ref 70–99)
Potassium: 4.4 mEq/L (ref 3.5–5.1)
SODIUM: 138 meq/L (ref 135–145)

## 2017-05-28 LAB — HEPATIC FUNCTION PANEL
ALBUMIN: 4.8 g/dL (ref 3.5–5.2)
ALT: 32 U/L (ref 0–53)
AST: 19 U/L (ref 0–37)
Alkaline Phosphatase: 42 U/L (ref 39–117)
Bilirubin, Direct: 0.1 mg/dL (ref 0.0–0.3)
Total Bilirubin: 0.5 mg/dL (ref 0.2–1.2)
Total Protein: 7.2 g/dL (ref 6.0–8.3)

## 2017-05-28 LAB — LIPID PANEL
CHOL/HDL RATIO: 3
Cholesterol: 156 mg/dL (ref 0–200)
HDL: 44.8 mg/dL (ref >39.00)
LDL Cholesterol: 84 mg/dL (ref 0–99)
NONHDL: 111.34
TRIGLYCERIDES: 138 mg/dL (ref 0.0–149.0)
VLDL: 27.6 mg/dL (ref 0.0–40.0)

## 2017-05-28 NOTE — Patient Instructions (Signed)
Schedule your complete physical in 6 months We'll notify you of your lab results and make any changes if needed Continue to work on healthy diet and regular exercise- you can do it! Call with any questions or concerns Happy Spring!! 

## 2017-05-28 NOTE — Assessment & Plan Note (Signed)
Chronic problem.  Tolerating statin w/o difficulty.  Stressed need for healthy diet and regular exercise.  Check labs.  Adjust meds prn  

## 2017-05-28 NOTE — Progress Notes (Signed)
   Subjective:    Patient ID: Rudie Sermons, male    DOB: 10/30/1970, 47 y.o.   MRN: [id number]  HPI Hyperlipidemia- chronic problem, on Lipitor 20mg  and Fenofibrate 160mg  daily.  Pt has gained 3-4 lbs since last visit.  No CP, SOB, HAs, no abd pain, N/V.  No regular exercise.   Review of Systems For ROS see HPI     Objective:   Physical Exam  Constitutional: He is oriented to person, place, and time. He appears well-developed and well-nourished. No distress.  HENT:  Head: Normocephalic and atraumatic.  Eyes: Conjunctivae and EOM are normal. Pupils are equal, round, and reactive to light.  Neck: Normal range of motion. Neck supple. No thyromegaly present.  Cardiovascular: Normal rate, regular rhythm, normal heart sounds and intact distal pulses.  No murmur heard. Pulmonary/Chest: Effort normal and breath sounds normal. No respiratory distress.  Abdominal: Soft. Bowel sounds are normal. He exhibits no distension.  Musculoskeletal: He exhibits no edema.  Lymphadenopathy:    He has no cervical adenopathy.  Neurological: He is alert and oriented to person, place, and time. No cranial nerve deficit.  Skin: Skin is warm and dry.  Psychiatric: He has a normal mood and affect. His behavior is normal.  Vitals reviewed.         Assessment & Plan:

## 2017-07-27 ENCOUNTER — Encounter: Payer: Self-pay | Admitting: Diagnostic Neuroimaging

## 2017-07-27 ENCOUNTER — Ambulatory Visit (INDEPENDENT_AMBULATORY_CARE_PROVIDER_SITE_OTHER): Payer: 59 | Admitting: Diagnostic Neuroimaging

## 2017-07-27 VITALS — BP 127/80 | HR 68 | Ht 67.0 in | Wt 211.2 lb

## 2017-07-27 DIAGNOSIS — G4733 Obstructive sleep apnea (adult) (pediatric): Secondary | ICD-10-CM

## 2017-07-27 DIAGNOSIS — F951 Chronic motor or vocal tic disorder: Secondary | ICD-10-CM | POA: Diagnosis not present

## 2017-07-27 MED ORDER — CLONIDINE HCL 0.1 MG PO TABS: 0.1000 mg | ORAL_TABLET | Freq: Two times a day (BID) | ORAL | 4 refills | Status: DC

## 2017-07-27 NOTE — Patient Instructions (Signed)
-   start exercise program 2-3 x per week  - [employer] app  - [employer]  - refer to sleep study

## 2017-07-27 NOTE — Progress Notes (Signed)
[facility]  PATIENT: Dakota Reid DOB: 05-23-1970  REFERRING CLINICIAN:  HISTORY FROM: patient  REASON FOR VISIT: chronic tic disorder   HISTORICAL  CHIEF COMPLAINT:  Chief Complaint  Patient presents with  . Follow-up  . Motor/vocal tic    Taking one tablet in PM and 1/2 tab in AM of the clonidine.     HISTORY OF PRESENT ILLNESS:   UPDATE (07/27/17, [facility]): Since last visit, doing well. Tolerating clonidine. No alleviating or aggravating factors.   UPDATE 07/29/16: Since last visit, doing well. Tics are under control. Same dose on clonidine.   UPDATE 04/01/15: Since last visit, doing well. Tics are essentially resolved. On clonidine 0.05mg  in AM and 0.1mg  in PM.   UPDATE 09/19/13: Since last visit, started on clonidine and tics have significantly reduced by 85-90%. He is satisfied with results. Doing well. BP is slightly up on persistent basis.   PRIOR 04/01/13: 47 year old right-handed male with hypertension, hypercholesteremia, here for evaluation of involuntary throat clicking movements and neck movements. Patient has had abnormal involuntary movements of his neck and head since he was 47 years old. These progressively got worse over time. He never sought medical attention for this. Symptoms are worse under stress and better if he has sleep. Symptoms and be better in the morning. Nowadays symptoms consist of intermittent throat clicking sounds, throat clearing, rapid movements of his head and right shoulder. No clear vocalizations with words. Patient try taking magnesium supplement 2 years ago which may have helped initially but no longer helps. Patient has some OCD traits according to the wife. No ADHD symptoms. His daughter recently was dx'd with OCD. There is family history of possible tic disorder in his uncle (eye blinking) and his mother (throat clearing)   REVIEW OF SYSTEMS: Full 14 system review of systems performed and notable only for freq waking daytime  sleepiness.   ALLERGIES: No Known Allergies  HOME MEDICATIONS: Outpatient Medications Prior to Visit  Medication Sig Dispense Refill  . atorvastatin (LIPITOR) 20 MG tablet TAKE 1 TABLET BY MOUTH  DAILY AT 6 PM. 90 tablet 1  . cloNIDine (CATAPRES) 0.1 MG tablet Take 1 tablet (0.1 mg total) by mouth 2 (two) times daily. 180 tablet 4  . fenofibrate 160 MG tablet TAKE 1 TABLET BY MOUTH  DAILY 90 tablet 1  . fluticasone (FLONASE) 50 MCG/ACT nasal spray Place 2 sprays into both nostrils daily.  4  . multivitamin (ONE-A-DAY MEN'S) TABS Take 1 tablet by mouth daily.     No facility-administered medications prior to visit.     PAST MEDICAL HISTORY: Past Medical History:  Diagnosis Date  . Hyperlipidemia   . Hypertension     PAST SURGICAL HISTORY: Past Surgical History:  Procedure Laterality Date  . WISDOM TOOTH EXTRACTION      FAMILY HISTORY: Family History  Problem Relation Age of Onset  . Hypertension Father   . Kidney Stones Father   . Benign prostatic hyperplasia Father   . Diabetes Mother   . Cancer Mother        cholangiocarcinoma  . Prostate cancer Paternal Grandfather   . Cancer Maternal Grandmother        colon  . Colon cancer Maternal Grandmother   . Lung cancer Maternal Grandmother     SOCIAL HISTORY:  Social History   Socioeconomic History  . Marital status: Married    Spouse name: Not on file  . Number of children: 2  . Years of education: BA  .  Highest education level: Not on file  Occupational History    Employer: [employer]  . Financial resource strain: Not on file  . Food insecurity:    Worry: Not on file    Inability: Not on file  . Transportation needs:    Medical: Not on file    Non-medical: Not on file  Tobacco Use  . Smoking status: Former Smoker    Last attempt to quit: 03/17/2007    Years since quitting: 10.3  . Smokeless tobacco: Never Used  Substance and Sexual Activity  .  Alcohol use: Yes    Comment: 2-3 beers per week  . Drug use: No  . Sexual activity: Not on file  Lifestyle  . Physical activity:    Days per week: Not on file    Minutes per session: Not on file  . Stress: Not on file  Relationships  . Social connections:    Talks on phone: Not on file    Gets together: Not on file    Attends religious service: Not on file    Active member of club or organization: Not on file    Attends meetings of clubs or organizations: Not on file    Relationship status: Not on file  . Intimate partner violence:    Fear of current or ex partner: Not on file    Emotionally abused: Not on file    Physically abused: Not on file    Forced sexual activity: Not on file  Other Topics Concern  . Not on file  Social History Narrative   Married, twin girls.   Patient lives at home with his family.   Caffeine Use:      PHYSICAL EXAM  Vitals:   07/27/17 0808  BP: 127/80  Pulse: 68  Weight: 211 lb 3.2 oz (95.8 kg)  Height: 5\' 7"  (1.702 m)    Not recorded     Wt Readings from Last 3 Encounters:  07/27/17 211 lb 3.2 oz (95.8 kg)  05/28/17 210 lb 6 oz (95.4 kg)  11/26/16 207 lb 6 oz (94.1 kg)   Body mass index is 33.08 kg/m.  GENERAL EXAM: Patient is in no distress; well developed, nourished and groomed; neck is supple  CARDIOVASCULAR: Regular rate and rhythm, no murmurs   NEUROLOGIC: MENTAL STATUS: awake, alert, language fluent, comprehension intact, naming intact, fund of knowledge appropriate CRANIAL NERVE: pupils equal and reactive to light, visual fields full to confrontation, extraocular muscles intact, no nystagmus, facial sensation and strength symmetric, hearing intact, palate elevates symmetrically, uvula midline, shoulder shrug symmetric, tongue midline. MOTOR: normal bulk and tone, full strength in the BUE, BLE SENSORY: normal and symmetric to light touch COORDINATION: finger-nose-finger, fine finger movements normal REFLEXES: deep tendon  reflexes present and symmetric GAIT/STATION: narrow based gait; romberg is negative     DIAGNOSTIC DATA (LABS, IMAGING, TESTING) - I reviewed patient records, labs, notes, testing and imaging myself where available.  Lab Results  Component Value Date   WBC 8.8 11/26/2016   HGB 15.8 11/26/2016   HCT 46.6 11/26/2016   MCV 83.8 11/26/2016   PLT 289.0 11/26/2016      Component Value Date/Time   NA 138 05/28/2017 0830   K 4.4 05/28/2017 0830   CL 102 05/28/2017 0830   CO2 30 05/28/2017 0830   GLUCOSE 110 (H) 05/28/2017 0830   BUN 17 05/28/2017 0830   CREATININE 1.22 05/28/2017 0830   CREATININE  1.01 11/02/2014 1550   CALCIUM 9.9 05/28/2017 0830   PROT 7.2 05/28/2017 0830   ALBUMIN 4.8 05/28/2017 0830   AST 19 05/28/2017 0830   ALT 32 05/28/2017 0830   ALKPHOS 42 05/28/2017 0830   BILITOT 0.5 05/28/2017 0830   GFRNONAA 105.09 08/15/2009 1052   Lab Results  Component Value Date   CHOL 156 05/28/2017   HDL 44.80 05/28/2017   LDLCALC 84 05/28/2017   LDLDIRECT 95.0 11/26/2016   TRIG 138.0 05/28/2017   CHOLHDL 3 05/28/2017   Lab Results  Component Value Date   HGBA1C 5.5 09/15/2010   No results found for: VITAMINB12 Lab Results  Component Value Date   TSH 1.22 11/26/2016    04/07/12 MRI brain - normal  04/21/14 PSG - IMPRESSION:  Mild obstructive sleep apnea/hypopnea syndrome, AHI 13.9 per hour. 97 total events scored including 11 central apneas, 14 obstructive apneas, 14 mixed apneas, 58 hypopneas. Oxygen desaturation to a nadir of 87% and average oxygen saturation 93% on room air. Mean heart rate 62.9/m. - RECOMMENDATION:  Treatment for scores in this range most commonly starts with CPAP unless another approach is more appropriate on an individual basis.       ASSESSMENT AND PLAN  47 y.o. year old male here with chronic tic disorder. Doing well on clonidine.    Dx:  Chronic motor or vocal tic disorder  OSA (obstructive sleep apnea) - Plan: Ambulatory  referral to Sleep Studies    PLAN:  CHRONIC TIC DISORDER - continue clonidine - monitor BP, reduce salt intake and increase physical activity  MILD OSA - will refer to sleep clinic for OSA eval (previously eval'd in 2016 by Dr. Annamaria Boots; not currently on treatment; patient requesting eval and treatment in our clinic)  DYSTHYMIA / DECR LIBIDO - recommend to start 2-3 x per week exercise - follow up with PCP  Meds ordered this encounter  Medications  . cloNIDine (CATAPRES) 0.1 MG tablet    Sig: Take 1 tablet (0.1 mg total) by mouth 2 (two) times daily.    Dispense:  180 tablet    Refill:  4   Return in about 1 year (around 07/28/2018).    Penni Bombard, MD 5/46/5681, 2:75 AM Certified in Neurology, Neurophysiology and Neuroimaging  [facility] [address] [place], [place] [address] [phone]

## 2017-07-30 ENCOUNTER — Ambulatory Visit: Payer: 59 | Admitting: Diagnostic Neuroimaging

## 2017-08-13 ENCOUNTER — Other Ambulatory Visit: Payer: Self-pay | Admitting: Family Medicine

## 2017-08-17 ENCOUNTER — Encounter: Payer: Self-pay | Admitting: Neurology

## 2017-08-25 ENCOUNTER — Other Ambulatory Visit: Payer: Self-pay | Admitting: Family Medicine

## 2017-09-13 ENCOUNTER — Institutional Professional Consult (permissible substitution): Payer: 59 | Admitting: Neurology

## 2017-11-09 ENCOUNTER — Institutional Professional Consult (permissible substitution): Payer: 59 | Admitting: Neurology

## 2017-12-06 ENCOUNTER — Encounter: Payer: Self-pay | Admitting: Family Medicine

## 2017-12-20 ENCOUNTER — Encounter: Payer: 59 | Admitting: Family Medicine

## 2017-12-21 ENCOUNTER — Encounter: Payer: Self-pay | Admitting: Physician Assistant

## 2017-12-21 ENCOUNTER — Ambulatory Visit (INDEPENDENT_AMBULATORY_CARE_PROVIDER_SITE_OTHER): Payer: 59 | Admitting: Physician Assistant

## 2017-12-21 ENCOUNTER — Other Ambulatory Visit: Payer: Self-pay

## 2017-12-21 VITALS — BP 132/82 | HR 73 | Temp 98.6°F | Resp 14 | Ht 67.0 in | Wt 211.0 lb

## 2017-12-21 DIAGNOSIS — E785 Hyperlipidemia, unspecified: Secondary | ICD-10-CM

## 2017-12-21 DIAGNOSIS — Z23 Encounter for immunization: Secondary | ICD-10-CM | POA: Diagnosis not present

## 2017-12-21 DIAGNOSIS — R3914 Feeling of incomplete bladder emptying: Secondary | ICD-10-CM

## 2017-12-21 DIAGNOSIS — Z0000 Encounter for general adult medical examination without abnormal findings: Secondary | ICD-10-CM

## 2017-12-21 DIAGNOSIS — N401 Enlarged prostate with lower urinary tract symptoms: Secondary | ICD-10-CM | POA: Diagnosis not present

## 2017-12-21 NOTE — Progress Notes (Signed)
Patient presents to clinic today for annual exam.  Patient is fasting for labs.  Acute Concerns: Patient endorses a few months of occasional discomfort in the prostate/rectal region with some urinary hesitancy and sensation of incomplete bladder emptying. Notes occasional sensation of pressure in this area.. Denies melena, hematochezia, tenesmus or hematuria. Denies fever, chills or unexplainable weight loss.  Chronic Issues: Hyperlipidemia -- Patient is currently taking Atorvastatin 20 mg daily and Fenofibrate 160 mg daily. Is trying to watch diet and keep active. No regular exercise at present.   Health Maintenance: Immunizations -- Due for flu shot. Agrees to get today.  Past Medical History:  Diagnosis Date  . Hyperlipidemia   . Hypertension     Past Surgical History:  Procedure Laterality Date  . WISDOM TOOTH EXTRACTION      Current Outpatient Medications on File Prior to Visit  Medication Sig Dispense Refill  . atorvastatin (LIPITOR) 20 MG tablet TAKE 1 TABLET BY MOUTH  DAILY AT 6 PM 90 tablet 1  . cloNIDine (CATAPRES) 0.1 MG tablet Take 1 tablet (0.1 mg total) by mouth 2 (two) times daily. 180 tablet 4  . fenofibrate 160 MG tablet TAKE 1 TABLET BY MOUTH  DAILY 90 tablet 1  . fluticasone (FLONASE) 50 MCG/ACT nasal spray Place 2 sprays into both nostrils daily.  4  . multivitamin (ONE-A-DAY MEN'S) TABS Take 1 tablet by mouth daily.     No current facility-administered medications on file prior to visit.     No Known Allergies  Family History  Problem Relation Age of Onset  . Hypertension Father   . Kidney Stones Father   . Benign prostatic hyperplasia Father   . Diabetes Mother   . Cancer Mother        cholangiocarcinoma  . Prostate cancer Paternal Grandfather   . Cancer Maternal Grandmother        colon  . Colon cancer Maternal Grandmother   . Lung cancer Maternal Grandmother     Social History   Socioeconomic History  . Marital status: Married   Spouse name: Not on file  . Number of children: 2  . Years of education: BA  . Highest education level: Not on file  Occupational History    Employer: [employer]  . Financial resource strain: Not on file  . Food insecurity:    Worry: Not on file    Inability: Not on file  . Transportation needs:    Medical: Not on file    Non-medical: Not on file  Tobacco Use  . Smoking status: Former Smoker    Last attempt to quit: 03/17/2007    Years since quitting: 10.7  . Smokeless tobacco: Never Used  Substance and Sexual Activity  . Alcohol use: Yes    Comment: 2-3 beers per week  . Drug use: No  . Sexual activity: Not on file  Lifestyle  . Physical activity:    Days per week: Not on file    Minutes per session: Not on file  . Stress: Not on file  Relationships  . Social connections:    Talks on phone: Not on file    Gets together: Not on file    Attends religious service: Not on file    Active member of club or organization: Not on file    Attends meetings of clubs or organizations: Not on file    Relationship status: Not on file  .  Intimate partner violence:    Fear of current or ex partner: Not on file    Emotionally abused: Not on file    Physically abused: Not on file    Forced sexual activity: Not on file  Other Topics Concern  . Not on file  Social History Narrative   Married, twin girls.   Patient lives at home with his family.   Caffeine Use:    Review of Systems  Constitutional: Negative for fever and weight loss.  HENT: Negative for ear discharge, ear pain, hearing loss and tinnitus.   Eyes: Negative for blurred vision, double vision, photophobia and pain.  Respiratory: Negative for cough and shortness of breath.   Cardiovascular: Negative for chest pain and palpitations.  Gastrointestinal: Negative for abdominal pain, blood in stool, constipation, diarrhea, heartburn, melena, nausea and vomiting.    Genitourinary: Positive for frequency. Negative for dysuria, flank pain, hematuria and urgency.       Nocturia x 0-1. + incomplete bladder emptying  Musculoskeletal: Negative for falls.  Neurological: Negative for dizziness, loss of consciousness and headaches.  Endo/Heme/Allergies: Negative for environmental allergies.  Psychiatric/Behavioral: Negative for depression, hallucinations, substance abuse and suicidal ideas. The patient is not nervous/anxious and does not have insomnia.    BP 132/82   Pulse 73   Temp 98.6 F (37 C) (Oral)   Resp 14   Ht 5\' 7"  (1.702 m)   Wt 211 lb (95.7 kg)   SpO2 98%   BMI 33.05 kg/m   Physical Exam  Constitutional: He is oriented to person, place, and time. He appears well-developed and well-nourished.  HENT:  Head: Normocephalic and atraumatic.  Right Ear: External ear normal.  Left Ear: External ear normal.  Eyes: Conjunctivae are normal.  Neck: Neck supple.  Cardiovascular: Normal rate, regular rhythm, normal heart sounds and intact distal pulses.  Pulmonary/Chest: Effort normal and breath sounds normal.  Genitourinary: Rectal exam shows no mass and guaiac negative stool. Prostate is enlarged (left-sided. No palpable nodules.).  Lymphadenopathy:    He has no cervical adenopathy.  Neurological: He is alert and oriented to person, place, and time. No cranial nerve deficit.  Psychiatric: He has a normal mood and affect.  Vitals reviewed.  Assessment/Plan: 1. Hyperlipidemia, unspecified hyperlipidemia type Taking medications as directed. Recheck labs today. - Comprehensive metabolic panel - Lipid panel  2. Benign prostatic hyperplasia with incomplete bladder emptying Enlargement noted on examination without nodule. Giving symptoms of BPH plus atypical ongoing discomfort we will check PSA today and refer to Urology for further assessment before starting medication. - PSA - Referral to urology  3. Visit for preventive health  examination Depression screen negative. Health Maintenance reviewed. Preventive schedule discussed and handout given in AVS. Will obtain fasting labs today.  - CBC with Differential/Platelet - Comprehensive metabolic panel - Lipid panel  4. Need for immunization against influenza Flu shot given today. - Flu Vaccine QUAD 36+ mos IM   Leeanne Rio, PA-C

## 2017-12-21 NOTE — Patient Instructions (Signed)
Please go to the lab for blood work.  Our office will call you with your results unless you have chosen to receive results via MyChart. If your blood work is normal we will follow-up each year for physicals and as scheduled for chronic medical problems. If anything is abnormal we will treat accordingly and get you in for a follow-up. I am setting you up with Urology for further assessment.    Preventive Care 40-64 Years, Male Preventive care refers to lifestyle choices and visits with your health care provider that can promote health and wellness. What does preventive care include?  A yearly physical exam. This is also called an annual well check.  Dental exams once or twice a year.  Routine eye exams. Ask your health care provider how often you should have your eyes checked.  Personal lifestyle choices, including: ? Daily care of your teeth and gums. ? Regular physical activity. ? Eating a healthy diet. ? Avoiding tobacco and drug use. ? Limiting alcohol use. ? Practicing safe sex. ? Taking low-dose aspirin every day starting at age 28. What happens during an annual well check? The services and screenings done by your health care provider during your annual well check will depend on your age, overall health, lifestyle risk factors, and family history of disease. Counseling Your health care provider may ask you questions about your:  Alcohol use.  Tobacco use.  Drug use.  Emotional well-being.  Home and relationship well-being.  Sexual activity.  Eating habits.  Work and work Statistician.  Screening You may have the following tests or measurements:  Height, weight, and BMI.  Blood pressure.  Lipid and cholesterol levels. These may be checked every 5 years, or more frequently if you are over 60 years old.  Skin check.  Lung cancer screening. You may have this screening every year starting at age 67 if you have a 30-pack-year history of smoking and currently  smoke or have quit within the past 15 years.  Fecal occult blood test (FOBT) of the stool. You may have this test every year starting at age 78.  Flexible sigmoidoscopy or colonoscopy. You may have a sigmoidoscopy every 5 years or a colonoscopy every 10 years starting at age 63.  Prostate cancer screening. Recommendations will vary depending on your family history and other risks.  Hepatitis C blood test.  Hepatitis B blood test.  Sexually transmitted disease (STD) testing.  Diabetes screening. This is done by checking your blood sugar (glucose) after you have not eaten for a while (fasting). You may have this done every 1-3 years.  Discuss your test results, treatment options, and if necessary, the need for more tests with your health care provider. Vaccines Your health care provider may recommend certain vaccines, such as:  Influenza vaccine. This is recommended every year.  Tetanus, diphtheria, and acellular pertussis (Tdap, Td) vaccine. You may need a Td booster every 10 years.  Varicella vaccine. You may need this if you have not been vaccinated.  Zoster vaccine. You may need this after age 52.  Measles, mumps, and rubella (MMR) vaccine. You may need at least one dose of MMR if you were born in 1957 or later. You may also need a second dose.  Pneumococcal 13-valent conjugate (PCV13) vaccine. You may need this if you have certain conditions and have not been vaccinated.  Pneumococcal polysaccharide (PPSV23) vaccine. You may need one or two doses if you smoke cigarettes or if you have certain conditions.  Meningococcal vaccine.  You may need this if you have certain conditions.  Hepatitis A vaccine. You may need this if you have certain conditions or if you travel or work in places where you may be exposed to hepatitis A.  Hepatitis B vaccine. You may need this if you have certain conditions or if you travel or work in places where you may be exposed to hepatitis  B.  Haemophilus influenzae type b (Hib) vaccine. You may need this if you have certain risk factors.  Talk to your health care provider about which screenings and vaccines you need and how often you need them. This information is not intended to replace advice given to you by your health care provider. Make sure you discuss any questions you have with your health care provider. Document Released: 03/29/2015 Document Revised: 11/20/2015 Document Reviewed: 01/01/2015 Elsevier Interactive Patient Education  [employer].

## 2017-12-22 ENCOUNTER — Encounter: Payer: Self-pay | Admitting: Family Medicine

## 2017-12-22 LAB — COMPREHENSIVE METABOLIC PANEL
ALBUMIN: 4.9 g/dL (ref 3.5–5.2)
ALT: 38 U/L (ref 0–53)
AST: 26 U/L (ref 0–37)
Alkaline Phosphatase: 40 U/L (ref 39–117)
BUN: 16 mg/dL (ref 6–23)
CALCIUM: 10 mg/dL (ref 8.4–10.5)
CO2: 29 meq/L (ref 19–32)
Chloride: 103 mEq/L (ref 96–112)
Creatinine, Ser: 1.43 mg/dL (ref 0.40–1.50)
GFR: 56.81 mL/min — AB (ref >60.00)
GLUCOSE: 114 mg/dL — AB (ref 70–99)
Potassium: 3.8 mEq/L (ref 3.5–5.1)
Sodium: 140 mEq/L (ref 135–145)
TOTAL PROTEIN: 7.5 g/dL (ref 6.0–8.3)
Total Bilirubin: 0.6 mg/dL (ref 0.2–1.2)

## 2017-12-22 LAB — CBC WITH DIFFERENTIAL/PLATELET
BASOS ABS: 0.1 10*3/uL (ref 0.0–0.1)
Basophils Relative: 0.9 % (ref 0.0–3.0)
EOS ABS: 0.1 10*3/uL (ref 0.0–0.7)
Eosinophils Relative: 2.3 % (ref 0.0–5.0)
HEMATOCRIT: 42.9 % (ref 39.0–52.0)
HEMOGLOBIN: 14.8 g/dL (ref 13.0–17.0)
LYMPHS PCT: 31.1 % (ref 12.0–46.0)
Lymphs Abs: 2 10*3/uL (ref 0.7–4.0)
MCHC: 34.4 g/dL (ref 30.0–36.0)
MCV: 82.2 fl (ref 78.0–100.0)
MONOS PCT: 7.5 % (ref 3.0–12.0)
Monocytes Absolute: 0.5 10*3/uL (ref 0.1–1.0)
NEUTROS ABS: 3.7 10*3/uL (ref 1.4–7.7)
Neutrophils Relative %: 58.2 % (ref 43.0–77.0)
PLATELETS: 259 10*3/uL (ref 150.0–400.0)
RBC: 5.22 Mil/uL (ref 4.22–5.81)
RDW: 12.8 % (ref 11.5–15.5)
WBC: 6.3 10*3/uL (ref 4.0–10.5)

## 2017-12-22 LAB — LIPID PANEL
Cholesterol: 163 mg/dL (ref 0–200)
HDL: 38.1 mg/dL — ABNORMAL LOW (ref >39.00)
NonHDL: 125.19
TRIGLYCERIDES: 206 mg/dL — AB (ref 0.0–149.0)
Total CHOL/HDL Ratio: 4
VLDL: 41.2 mg/dL — ABNORMAL HIGH (ref 0.0–40.0)

## 2017-12-22 LAB — PSA: PSA: 1.19 ng/mL (ref 0.10–4.00)

## 2017-12-22 LAB — LDL CHOLESTEROL, DIRECT: Direct LDL: 101 mg/dL

## 2017-12-23 ENCOUNTER — Encounter: Payer: Self-pay | Admitting: Neurology

## 2017-12-23 ENCOUNTER — Ambulatory Visit (INDEPENDENT_AMBULATORY_CARE_PROVIDER_SITE_OTHER): Payer: 59 | Admitting: Neurology

## 2017-12-23 VITALS — BP 138/79 | HR 72 | Ht 67.0 in | Wt 210.0 lb

## 2017-12-23 DIAGNOSIS — G4719 Other hypersomnia: Secondary | ICD-10-CM | POA: Diagnosis not present

## 2017-12-23 DIAGNOSIS — R51 Headache: Secondary | ICD-10-CM

## 2017-12-23 DIAGNOSIS — G4733 Obstructive sleep apnea (adult) (pediatric): Secondary | ICD-10-CM | POA: Diagnosis not present

## 2017-12-23 DIAGNOSIS — F959 Tic disorder, unspecified: Secondary | ICD-10-CM

## 2017-12-23 DIAGNOSIS — E669 Obesity, unspecified: Secondary | ICD-10-CM

## 2017-12-23 DIAGNOSIS — R519 Headache, unspecified: Secondary | ICD-10-CM

## 2017-12-23 NOTE — Patient Instructions (Signed)

## 2017-12-23 NOTE — Progress Notes (Signed)
Subjective:    Patient ID: Dakota Reid is a 47 y.o. male.  HPI     Star Age, MD, PhD [facility] [address], [address] [facility] [place], [place] [address]  Dear Bonnita Levan,   I saw your patient, Dakota Reid, upon your kind request in my sleep clinic today for initial consultation of his sleep disorder, in particular, reevaluation of his prior diagnosis of obstructive sleep apnea. The patient is unaccompanied today. As you know, Dakota Reid is a 47 year old right-handed gentleman with an underlying medical history of hypertension, hyperlipidemia, tic disorder and obesity, who reports snoring and excessive daytime somnolence. I reviewed your office note from 07/27/2017. He had a home sleep test in February 2016 ordered by ENT, which indicated an AHI of 13.9 per hour, average oxygen saturation of 93%, nadir of 87%. His weight at the time was 200 pounds, he has had weight gain in the interim, about 11 lb increase.  He has not been on CPAP therapy. His Epworth sleepiness score is 11 out of 24 today, fatigue score is 42 out of 63. He lives at home with his wife and 3 daughters. He quit smoking 2007, drinks alcohol infrequently, about twice a month on average, drinks caffeine in the form of coffee, up to one pot per day (8 servings), typically before noon. His bedtime is generally around 10:30 or 11, arise time around 6. He does not have night to night nocturia but has had rare morning headaches, typically dull, achy, and the back of his head. He lives at home with his family including wife and 23-year-old twin daughters and a 65-year-old daughter. He is not aware of any family history of OSA. 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 He does not often wake up rested. His wife has noted snoring and also pauses in his breathing. He denies telltale symptoms of versus leg syndrome but does sleep with a pillow between his legs because it helps his back  pain.  His Past Medical History Is Significant For: Past Medical History:  Diagnosis Date  . Hyperlipidemia   . Hypertension     His Past Surgical History Is Significant For: Past Surgical History:  Procedure Laterality Date  . WISDOM TOOTH EXTRACTION      His Family History Is Significant For: Family History  Problem Relation Age of Onset  . Hypertension Father   . Kidney Stones Father   . Benign prostatic hyperplasia Father   . Diabetes Mother   . Cancer Mother        cholangiocarcinoma  . Prostate cancer Paternal Grandfather   . Cancer Maternal Grandmother        colon  . Colon cancer Maternal Grandmother   . Lung cancer Maternal Grandmother     His Social History Is Significant For: Social History   Socioeconomic History  . Marital status: Married    Spouse name: Not on file  . Number of children: 2  . Years of education: BA  . Highest education level: Not on file  Occupational History    Employer: [employer]  . Financial resource strain: Not on file  . Food insecurity:    Worry: Not on file    Inability: Not on file  . Transportation needs:    Medical: Not on file    Non-medical: Not on file  Tobacco Use  . Smoking status: Former  Smoker    Last attempt to quit: 03/17/2007    Years since quitting: 10.7  . Smokeless tobacco: Never Used  Substance and Sexual Activity  . Alcohol use: Yes    Comment: 2-3 beers per week  . Drug use: No  . Sexual activity: Not on file  Lifestyle  . Physical activity:    Days per week: Not on file    Minutes per session: Not on file  . Stress: Not on file  Relationships  . Social connections:    Talks on phone: Not on file    Gets together: Not on file    Attends religious service: Not on file    Active member of club or organization: Not on file    Attends meetings of clubs or organizations: Not on file    Relationship status: Not on file  Other Topics Concern   . Not on file  Social History Narrative   Married, twin girls.   Patient lives at home with his family.   Caffeine Use:     His Allergies Are:  No Known Allergies:   His Current Medications Are:  Outpatient Encounter Medications as of 12/23/2017  Medication Sig  . atorvastatin (LIPITOR) 20 MG tablet TAKE 1 TABLET BY MOUTH  DAILY AT 6 PM  . cloNIDine (CATAPRES) 0.1 MG tablet Take 1 tablet (0.1 mg total) by mouth 2 (two) times daily.  . fenofibrate 160 MG tablet TAKE 1 TABLET BY MOUTH  DAILY  . fluticasone (FLONASE) 50 MCG/ACT nasal spray Place 2 sprays into both nostrils daily.  . multivitamin (ONE-A-DAY MEN'S) TABS Take 1 tablet by mouth daily.   No facility-administered encounter medications on file as of 12/23/2017.   :  Review of Systems:  Out of a complete 14 point review of systems, all are reviewed and negative with the exception of these symptoms as listed below: Review of Systems  Neurological:       Pt presents today to discuss his sleep. Pt has had a sleep study before and was told it was borderline and that he did not need a cpap. Pt wants his sleep apnea re-evaluated.  Epworth Sleepiness Scale 0= would never doze 1= slight chance of dozing 2= moderate chance of dozing 3= high chance of dozing  Sitting and reading: 3 Watching TV: 2 Sitting inactive in a public place (ex. Theater or meeting): 1 As a passenger in a car for an hour without a break: 0 Lying down to rest in the afternoon: 3 Sitting and talking to someone: 0 Sitting quietly after lunch (no alcohol): 2 In a car, while stopped in traffic: 0 Total: 11     Objective:  Neurological Exam  Physical Exam Physical Examination:   Vitals:   12/23/17 1603  BP: 138/79  Pulse: 72    General Examination: The patient is a very pleasant 47 y.o. male in no acute distress. He appears well-developed and well-nourished and well groomed.   HEENT: Normocephalic, atraumatic, pupils are equal, round and  reactive to light and accommodation. Corrective glasses in place. No telltale vocal tics noticeable. Extraocular tracking is good without limitation to gaze excursion or nystagmus noted. Normal smooth pursuit is noted. Hearing is grossly intact. Face is symmetric with normal facial animation and normal facial sensation. Speech is clear with no dysarthria noted. There is no hypophonia. There is no lip, neck/head, jaw or voice tremor. Neck is supple with full range of passive and active motion. There are no carotid bruits on  auscultation. Oropharynx exam reveals: mild mouth dryness, adequate dental hygiene andmild airway crowding secondary to longer uvula and tonsils in place of about 1+ bilaterally. Mallampati is class II. Neck circumference is 17-1/4 inches. He has a minimal overbite. Tongue protrudes centrally and palate elevates symmetrically.  Chest: Clear to auscultation without wheezing, rhonchi or crackles noted.  Heart: S1+S2+0, regular and normal without murmurs, rubs or gallops noted.   Abdomen: Soft, non-tender and non-distended with normal bowel sounds appreciated on auscultation.  Extremities: There is no pitting edema in the distal lower extremities bilaterally.   Skin: Warm and dry without trophic changes noted.  Musculoskeletal: exam reveals no obvious joint deformities, tenderness or joint swelling or erythema.   Neurologically:  Mental status: The patient is awake, alert and oriented in all 4 spheres. His immediate and remote memory, attention, language skills and fund of knowledge are appropriate. There is no evidence of aphasia, agnosia, apraxia or anomia. Speech is clear with normal prosody and enunciation. Thought process is linear. Mood is normal and affect is normal.  Cranial nerves II - XII are as described above under HEENT exam. In addition: shoulder shrug is normal with equal shoulder height noted. Motor exam: Normal bulk, strength and tone is noted. There is no drift,  tremor or rebound. Romberg is negative. Fine motor skills and coordination: grossly intact.  Cerebellar testing: No dysmetria or intention tremor. There is no truncal or gait ataxia.  Sensory exam: intact to light touch in the upper and lower extremities.  Gait, station and balance: He stands easily. No veering to one side is noted. No leaning to one side is noted. Posture is age-appropriate and stance is narrow based. Gait shows normal stride length and normal pace. No problems turning are noted. Tandem walk is unremarkable.   Assessment and plan:  In summary, Dakota Reid is a very pleasant 47 y.o.-year old male with an underlying medical history of hypertension, hyperlipidemia, tic disorder and obesity, who presents for reevaluation of his obstructive sleep apnea. He had mild sleep apnea in the past, had interim weight gain in the realm of 10 pounds. He snores and has breathing pauses while asleep and does not wake up rested. His history and physical exam are indeed concerning for underlying obstructive sleep apnea and memory testing would be reasonable and treatment with positive airway pressure also indicated. I had a long chat with the patient about my findings and the diagnosis of OSA, its prognosis and treatment options. We talked about medical treatments, surgical interventions and non-pharmacological approaches. I explained in particular the risks and ramifications of untreated moderate to severe OSA, especially with respect to developing cardiovascular disease down the Road, including congestive heart failure, difficult to treat hypertension, cardiac arrhythmias, or stroke. Even type 2 diabetes has, in part, been linked to untreated OSA. Symptoms of untreated OSA include daytime sleepiness, memory problems, mood irritability and mood disorder such as depression and anxiety, lack of energy, as well as recurrent headaches, especially morning headaches. We talked about trying to maintain a healthy  lifestyle in general, as well as the importance of weight control. I encouraged the patient to eat healthy, exercise daily and keep well hydrated, to keep a scheduled bedtime and wake time routine, to not skip any meals and eat healthy snacks in between meals. I advised the patient not to drive when feeling sleepy. I recommended the following at this time: sleep study with potential positive airway pressure titration. (We will score hypopneas at 4%).  I explained the sleep test procedure to the patient and also outlined possible surgical and non-surgical treatment options of OSA, including the use of a custom-made dental device (which would require a referral to a specialist dentist or oral surgeon), upper airway surgical options, such as pillar implants, radiofrequency surgery, tongue base surgery, and UPPP (which would involve a referral to an ENT surgeon). Rarely, jaw surgery such as mandibular advancement may be considered.  I also explained the CPAP treatment option to the patient, who indicated that he would be willing to try CPAP if the need arises. I explained the importance of being compliant with PAP treatment, not only for insurance purposes but primarily to improve His symptoms, and for the patient's long term health benefit, including to reduce His cardiovascular risks. I answered all his questions today and the patient was in agreement. I plan to see him back after the sleep study is completed and encouraged him to call with any interim questions, concerns, problems or updates.   Thank you very much for allowing me to participate in the care of this nice patient. If I can be of any further assistance to you please do not hesitate to call me at [phone].  Sincerely,   Star Age, MD, PhD

## 2017-12-28 ENCOUNTER — Telehealth: Payer: Self-pay

## 2017-12-28 DIAGNOSIS — G4733 Obstructive sleep apnea (adult) (pediatric): Secondary | ICD-10-CM

## 2017-12-28 NOTE — Telephone Encounter (Signed)
[employer] will deny in lab sleep study. Does not meet criteria. Need HST order

## 2017-12-29 ENCOUNTER — Encounter: Payer: 59 | Admitting: Family Medicine

## 2018-01-03 ENCOUNTER — Other Ambulatory Visit: Payer: Self-pay | Admitting: Family Medicine

## 2018-01-10 NOTE — Telephone Encounter (Signed)
VO for HST from Dr. Athar received. HST order placed.  

## 2018-01-13 ENCOUNTER — Other Ambulatory Visit: Payer: Self-pay | Admitting: Family Medicine

## 2018-02-02 ENCOUNTER — Ambulatory Visit (INDEPENDENT_AMBULATORY_CARE_PROVIDER_SITE_OTHER): Payer: 59 | Admitting: Neurology

## 2018-02-02 DIAGNOSIS — G4733 Obstructive sleep apnea (adult) (pediatric): Secondary | ICD-10-CM

## 2018-02-16 ENCOUNTER — Telehealth: Payer: Self-pay

## 2018-02-16 NOTE — Progress Notes (Signed)
Patient referred by Dr. Leta Baptist, seen by me on 12/23/17, HST on 02/02/18.    Please call and notify the patient that the recent home sleep test showed obstructive sleep apnea. OSA is overall mild, but worth treating to see if he feels better after treatment. To that end I recommend treatment for this in the form of autoPAP, which means, that we don't have to bring him in for a sleep study with CPAP, but will let him try an autoPAP machine at home, through a DME company (of his choice, or as per insurance requirement). The DME representative will educate him on how to use the machine, how to put the mask on, etc. I have placed an order in the chart. Please send referral, talk to patient, send report to referring MD. We will need a FU in sleep clinic for 10 weeks post-PAP set up, please arrange that with me or one of our NPs. Thanks,   Star Age, MD, PhD [facility])

## 2018-02-16 NOTE — Procedures (Signed)
[facility] [address], [place] [address] NAME:  Dakota Reid                                                                              DOB: 09/30/1970 MEDICAL RECORD no:  [id number]                                                       DOS:02/02/2018   REFERRING PHYSICIAN: Andrey Spearman, MD STUDY PERFORMED: Home Sleep Test on Watch Pat HISTORY: 47 year old man with a history of hypertension, hyperlipidemia, tic disorder and obesity, who reports snoring and excessive daytime somnolence. His Epworth sleepiness score is 11 out of 24, BMI of 32.9.  STUDY RESULTS:  Total Recording Time: 7 hours  22  minutes Total Sleep Time: 6hr 9 min Total Apnea/Hypopnea Index (AHI):  7.2 /h        RDI: 12.2 /h Average Oxygen Saturation:   95 %         Lowest Oxygen Desaturation: 89 %  Total Time Oxygen Saturation Below or at 88 %: 0 minutes  Average Heart Rate:  56 bpm (between 47 and 94 bpm) IMPRESSION: OSA RECOMMENDATION: This home sleep test demonstrates overall mild obstructive sleep apnea with a total AHI of 7.2/hour and O2 nadir of 89%. Given the patient's medical history and sleep related complaints, treatment with positive airway pressure is a reasonable approach and will be offered to the patient. Treatment can be achieved in the form of autoPAP trial/titration at home. A full night CPAP titration study will help with proper treatment settings and mask fitting if needed. Alternative treatments include weight loss along with avoidance of the supine sleep position, or an oral appliance in appropriate candidates. Please note that untreated obstructive sleep apnea may carry additional perioperative morbidity. Patients with significant obstructive sleep apnea should receive perioperative PAP therapy and the surgeons and particularly the anesthesiologist should be informed of the diagnosis and the severity of the sleep disordered breathing. The patient should be  cautioned not to drive, work at heights, or operate dangerous or heavy equipment when tired or sleepy. Review and reiteration of good sleep hygiene measures should be pursued with any patient. Other causes of the patient's symptoms, including circadian rhythm disturbances, an underlying mood disorder, medication effect and/or an underlying medical problem cannot be ruled out based on this test. Clinical correlation is recommended. The patient and his referring provider will be notified of the test results. The patient will be seen in follow up in sleep clinic at [facility].  I certify that I have reviewed the raw data recording prior to the issuance of this report in accordance with the standards of the American Academy of Sleep Medicine (AASM).  Star Age, MD, PhD [facility]) Diplomat, ABPN (Neurology and Sleep)

## 2018-02-16 NOTE — Addendum Note (Signed)
Addended by: Star Age on: 02/16/2018 07:52 AM   Modules accepted: Orders

## 2018-02-16 NOTE — Telephone Encounter (Signed)
I called pt to discuss his sleep study results. No answer, left a message asking him to call me back. 

## 2018-02-16 NOTE — Telephone Encounter (Signed)
-----   Message from Star Age, MD sent at 02/16/2018  7:52 AM EST ----- Patient referred by Dr. Leta Baptist, seen by me on 12/23/17, HST on 02/02/18.    Please call and notify the patient that the recent home sleep test showed obstructive sleep apnea. OSA is overall mild, but worth treating to see if he feels better after treatment. To that end I recommend treatment for this in the form of autoPAP, which means, that we don't have to bring him in for a sleep study with CPAP, but will let him try an autoPAP machine at home, through a DME company (of his choice, or as per insurance requirement). The DME representative will educate him on how to use the machine, how to put the mask on, etc. I have placed an order in the chart. Please send referral, talk to patient, send report to referring MD. We will need a FU in sleep clinic for 10 weeks post-PAP set up, please arrange that with me or one of our NPs. Thanks,   Star Age, MD, PhD [facility])

## 2018-02-17 NOTE — Telephone Encounter (Signed)
I called pt. I advised pt that Dr. Rexene Alberts reviewed their sleep study results and found that pt has mild osa. Dr. Rexene Alberts recommends that pt start an auto pap. I reviewed PAP compliance expectations with the pt. Pt is agreeable to starting an auto-PAP. I advised pt that an order will be sent to a DME, [facility], and [facility] will call the pt within about one week after they file with the pt's insurance. [facility] will show the pt how to use the machine, fit for masks, and troubleshoot the auto-PAP if needed. A follow up appt was made for insurance purposes with Hoyle Sauer, NP on 05/23/18 at 7:45am. Pt verbalized understanding to arrive 15 minutes early and bring their auto-PAP. A letter with all of this information in it will be sent to the pt's mychart as a reminder. I verified with the pt that the address we have on file is correct. Pt verbalized understanding of results. Pt had no questions at this time but was encouraged to call back if questions arise. I have sent the order to [facility] and have received confirmation that they have received the order.

## 2018-05-18 ENCOUNTER — Encounter: Payer: Self-pay | Admitting: Nurse Practitioner

## 2018-05-21 NOTE — Progress Notes (Addendum)
[facility]  PATIENT: Dakota Reid DOB: May 30, 1970   REASON FOR VISIT: Follow-up for newly diagnosed obstructive sleep apnea here for CPAP compliance HISTORY FROM: Patient    HISTORY OF PRESENT ILLNESS:UPDATE 3/9/2020CM Dakota Reid, 48 year old male returns for follow-up with newly diagnosed obstructive sleep apnea here for CPAP compliance.  He reports it took him several months to get used to the machine but now he is doing okay he feels he sleeps better more rested.  Compliance data dated 04/19/2018-05/18/2018 shows compliance greater than 4 hours and 100%.  Average usage 6 hours 55 minutes set pressure 5 to 11 cm.  EPR level 3 leak 95th percentile 4.6 AHI 0.6.  He returns for reevaluation 10/10/2019SAMr. Reid is a 48 year old right-handed gentleman with an underlying medical history of hypertension, hyperlipidemia, tic disorder and obesity, who reports snoring and excessive daytime somnolence. I reviewed your office note from 07/27/2017. He had a home sleep test in February 2016 ordered by ENT, which indicated an AHI of 13.9 per hour, average oxygen saturation of 93%, nadir of 87%. His weight at the time was 200 pounds, he has had weight gain in the interim, about 11 lb increase.  He has not been on CPAP therapy. His Epworth sleepiness score is 11 out of 24 today, fatigue score is 42 out of 63. He lives at home with his wife and 3 daughters. He quit smoking 2007, drinks alcohol infrequently, about twice a month on average, drinks caffeine in the form of coffee, up to one pot per day (8 servings), typically before noon. His bedtime is generally around 10:30 or 11, arise time around 6. He does not have night to night nocturia but has had rare morning headaches, typically dull, achy, and the back of his head. He lives at home with his family including wife and 20-year-old twin daughters and a 1-year-old daughter. He is not aware of any family history of OSA. 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 He does not often wake up rested. His wife has noted snoring and also pauses in his breathing. He denies telltale symptoms of versus leg syndrome but does sleep with a pillow between his legs because it helps his back pain.  REVIEW OF SYSTEMS: Full 14 system review of systems performed and notable only for those listed, all others are neg:  Constitutional: neg  Cardiovascular: neg Ear/Nose/Throat: neg  Skin: neg Eyes: neg Respiratory: neg Gastroitestinal: neg  Hematology/Lymphatic: neg  Endocrine: neg Musculoskeletal:neg Allergy/Immunology: neg Neurological: neg Psychiatric: neg Sleep : Obstructive sleep apnea with CPAP   ALLERGIES: No Known Allergies  HOME MEDICATIONS: Outpatient Medications Prior to Visit  Medication Sig Dispense Refill  . atorvastatin (LIPITOR) 20 MG tablet TAKE 1 TABLET BY MOUTH  DAILY AT 6 PM 90 tablet 1  . cloNIDine (CATAPRES) 0.1 MG tablet Take 1 tablet (0.1 mg total) by mouth 2 (two) times daily. 180 tablet 4  . fenofibrate 160 MG tablet TAKE 1 TABLET BY MOUTH  DAILY 90 tablet 1  . fluticasone (FLONASE) 50 MCG/ACT nasal spray Place 2 sprays into both nostrils daily.  4  . multivitamin (ONE-A-DAY MEN'S) TABS Take 1 tablet by mouth daily.     No facility-administered medications prior to visit.     PAST MEDICAL HISTORY: Past Medical History:  Diagnosis Date  . Hyperlipidemia   . Hypertension     PAST SURGICAL HISTORY: Past Surgical History:  Procedure Laterality Date  . WISDOM TOOTH  EXTRACTION      FAMILY HISTORY: Family History  Problem Relation Age of Onset  . Hypertension Father   . Kidney Stones Father   . Benign prostatic hyperplasia Father   . Diabetes Mother   . Cancer Mother        cholangiocarcinoma  . Prostate cancer Paternal Grandfather   . Cancer Maternal Grandmother        colon  . Colon cancer Maternal Grandmother   . Lung cancer Maternal Grandmother     SOCIAL  HISTORY: Social History   Socioeconomic History  . Marital status: Married    Spouse name: Not on file  . Number of children: 2  . Years of education: BA  . Highest education level: Not on file  Occupational History    Employer: [employer]  . Financial resource strain: Not on file  . Food insecurity:    Worry: Not on file    Inability: Not on file  . Transportation needs:    Medical: Not on file    Non-medical: Not on file  Tobacco Use  . Smoking status: Former Smoker    Last attempt to quit: 03/17/2007    Years since quitting: 11.1  . Smokeless tobacco: Never Used  Substance and Sexual Activity  . Alcohol use: Yes    Comment: 2-3 beers per week  . Drug use: No  . Sexual activity: Not on file  Lifestyle  . Physical activity:    Days per week: Not on file    Minutes per session: Not on file  . Stress: Not on file  Relationships  . Social connections:    Talks on phone: Not on file    Gets together: Not on file    Attends religious service: Not on file    Active member of club or organization: Not on file    Attends meetings of clubs or organizations: Not on file    Relationship status: Not on file  . Intimate partner violence:    Fear of current or ex partner: Not on file    Emotionally abused: Not on file    Physically abused: Not on file    Forced sexual activity: Not on file  Other Topics Concern  . Not on file  Social History Narrative   Married, twin girls.   Patient lives at home with his family.   Caffeine Use:      PHYSICAL EXAM  Vitals:   05/23/18 0737  BP: 123/84  Pulse: 67  Weight: 210 lb 6.4 oz (95.4 kg)  Height: 5\' 7"  (1.702 m)   Body mass index is 32.95 kg/m.  Generalized: Well developed, in no acute distress  Head: normocephalic and atraumatic,. Oropharynx benign  Neck: Supple,  Musculoskeletal: No deformity  Skin no rash or edema Neurological examination   Mentation: Alert  oriented to time, place, history taking. Attention span and concentration appropriate. Recent and remote memory intact.  Follows all commands speech and language fluent.   Cranial nerve II-XII: Pupils were equal round reactive to light extraocular movements were full, visual field were full on confrontational test. Facial sensation and strength were normal. hearing was intact to finger rubbing bilaterally. Uvula tongue midline. head turning and shoulder shrug were normal and symmetric.Tongue protrusion into cheek strength was normal. Motor: normal bulk and tone, full strength in the BUE, BLE,  Sensory: normal and symmetric to light touch,  Coordination: finger-nose-finger, heel-to-shin bilaterally, no dysmetria  Gait and Station: Rising up from seated position without assistance, normal stance,  moderate stride, good arm swing, smooth turning, able to perform tiptoe, and heel walking without difficulty. Tandem gait is steady  DIAGNOSTIC DATA (LABS, IMAGING, TESTING) - I reviewed patient records, labs, notes, testing and imaging myself where available.  Lab Results  Component Value Date   WBC 6.3 12/21/2017   HGB 14.8 12/21/2017   HCT 42.9 12/21/2017   MCV 82.2 12/21/2017   PLT 259.0 12/21/2017      Component Value Date/Time   NA 140 12/21/2017 1425   K 3.8 12/21/2017 1425   CL 103 12/21/2017 1425   CO2 29 12/21/2017 1425   GLUCOSE 114 (H) 12/21/2017 1425   BUN 16 12/21/2017 1425   CREATININE 1.43 12/21/2017 1425   CREATININE 1.01 11/02/2014 1550   CALCIUM 10.0 12/21/2017 1425   PROT 7.5 12/21/2017 1425   ALBUMIN 4.9 12/21/2017 1425   AST 26 12/21/2017 1425   ALT 38 12/21/2017 1425   ALKPHOS 40 12/21/2017 1425   BILITOT 0.6 12/21/2017 1425   GFRNONAA 105.09 08/15/2009 1052   Lab Results  Component Value Date   CHOL 163 12/21/2017   HDL 38.10 (L) 12/21/2017   LDLCALC 84 05/28/2017   LDLDIRECT 101.0 12/21/2017   TRIG 206.0 (H) 12/21/2017   CHOLHDL 4 12/21/2017   Lab Results   Component Value Date   HGBA1C 5.5 09/15/2010   No results found for: VITAMINB12 Lab Results  Component Value Date   TSH 1.22 11/26/2016    **  ASSESSMENT AND PLAN Mechel Schutter is a very pleasant 48 y.o.-year old male with an underlying medical history of hypertension, hyperlipidemia, tic disorder and obesity, who presents for reevaluation of his obstructive sleep apnea. He had mild sleep apnea in the past, had interim weight gain in the realm of 10 pounds.  Repeat sleep study continues to show mild obstructive sleep apnea here for initial CPAP compliance.Compliance data dated 04/19/2018-05/18/2018 shows compliance greater than 4 hours and 100%.  Average usage 6 hours 55 minutes set pressure 5 to 11 cm.  EPR level 3 leak 95th percentile 4.6 AHI 0.6.   CPAP compliance 100% reviewed data with patient Continue same settings Follow-up in 6 months I spent 15 minutes in total face to face time with the patient more than 50% of which was spent counseling and coordination of care, reviewing recent sleep test results  reviewing medications and discussing and reviewing the diagnosis of obstructive sleep apnea and further treatment options.  Discussed the importance of compliance visit for insurance purposes., Dennie Bible, [facility], [facility], APRN  [facility] [address], [place], [place] [address] [phone]  I reviewed the above note and documentation by the Nurse Practitioner and agree with the history, physical exam, assessment and plan as outlined above. I was immediately available for face-to-face consultation. Star Age, MD, PhD [facility])

## 2018-05-23 ENCOUNTER — Ambulatory Visit (INDEPENDENT_AMBULATORY_CARE_PROVIDER_SITE_OTHER): Payer: 59 | Admitting: Nurse Practitioner

## 2018-05-23 ENCOUNTER — Encounter: Payer: Self-pay | Admitting: Nurse Practitioner

## 2018-05-23 DIAGNOSIS — Z9989 Dependence on other enabling machines and devices: Secondary | ICD-10-CM | POA: Diagnosis not present

## 2018-05-23 DIAGNOSIS — G4733 Obstructive sleep apnea (adult) (pediatric): Secondary | ICD-10-CM | POA: Insufficient documentation

## 2018-05-23 NOTE — Patient Instructions (Signed)
CPAP compliance 100% Continue same settings Follow-up in 6 months

## 2018-06-22 ENCOUNTER — Other Ambulatory Visit: Payer: Self-pay | Admitting: Family Medicine

## 2018-07-02 ENCOUNTER — Other Ambulatory Visit: Payer: Self-pay | Admitting: Family Medicine

## 2018-07-12 ENCOUNTER — Other Ambulatory Visit: Payer: Self-pay | Admitting: Diagnostic Neuroimaging

## 2018-07-19 ENCOUNTER — Telehealth: Payer: Self-pay | Admitting: *Deleted

## 2018-07-19 NOTE — Telephone Encounter (Signed)
Attempted to reach pt to convert to video visit. No answer, voice MB full.

## 2018-08-02 ENCOUNTER — Ambulatory Visit: Payer: 59 | Admitting: Diagnostic Neuroimaging

## 2018-08-22 NOTE — Telephone Encounter (Signed)
Spoke with patient and updated EMR. 

## 2018-08-23 ENCOUNTER — Encounter: Payer: Self-pay | Admitting: Diagnostic Neuroimaging

## 2018-08-23 ENCOUNTER — Other Ambulatory Visit: Payer: Self-pay

## 2018-08-23 ENCOUNTER — Ambulatory Visit (INDEPENDENT_AMBULATORY_CARE_PROVIDER_SITE_OTHER): Payer: 59 | Admitting: Diagnostic Neuroimaging

## 2018-08-23 DIAGNOSIS — F951 Chronic motor or vocal tic disorder: Secondary | ICD-10-CM | POA: Diagnosis not present

## 2018-08-23 DIAGNOSIS — F959 Tic disorder, unspecified: Secondary | ICD-10-CM | POA: Diagnosis not present

## 2018-08-23 MED ORDER — CLONIDINE HCL 0.1 MG PO TABS: 0.1000 mg | ORAL_TABLET | Freq: Two times a day (BID) | ORAL | 4 refills | Status: DC

## 2018-08-23 NOTE — Progress Notes (Signed)
    Virtual Visit via Video Note  I connected with Dakota Reid on 08/23/18 at 11:30 AM EDT by a video enabled telemedicine application and verified that I am speaking with the correct person using two identifiers.   I discussed the limitations of evaluation and management by telemedicine and the availability of in person appointments. The patient expressed understanding and agreed to proceed.  Patient is at their home. I am at the office.    History of Present Illness:  - doing well; tics under control with clonidine - now on CPAP for OSA; feels more rested    Observations/Objective:  - Home BP this AM 125/82 - awake, alert - face symm - no dysarthria - no tremor; no tics   Assessment and Plan:  Dx:  1. Tic disorder   2. Chronic motor or vocal tic disorder      CHRONIC TIC DISORDER - continue clonidine - monitor BP  MILD OSA - will refer to sleep clinic for OSA eval (previously eval'd in 2016 by Dr. Annamaria Boots; not currently on treatment; patient requesting eval and treatment in our clinic)   Follow Up Instructions:  - Return in about 1 year (around 08/23/2019).    I discussed the assessment and treatment plan with the patient. The patient was provided an opportunity to ask questions and all were answered. The patient agreed with the plan and demonstrated an understanding of the instructions.   The patient was advised to call back or seek an in-person evaluation if the symptoms worsen or if the condition fails to improve as anticipated.  I provided 15 minutes of non-face-to-face time during this encounter.   Penni Bombard, MD 08/17/8451, 64:68 AM Certified in Neurology, Neurophysiology and Neuroimaging  [facility] [address], [place], [place] [address] [phone]

## 2018-09-29 ENCOUNTER — Encounter: Payer: Self-pay | Admitting: Family Medicine

## 2018-11-23 ENCOUNTER — Encounter: Payer: Self-pay | Admitting: Adult Health

## 2018-11-24 ENCOUNTER — Encounter: Payer: Self-pay | Admitting: Adult Health

## 2018-11-24 ENCOUNTER — Ambulatory Visit (INDEPENDENT_AMBULATORY_CARE_PROVIDER_SITE_OTHER): Payer: 59 | Admitting: Adult Health

## 2018-11-24 ENCOUNTER — Other Ambulatory Visit: Payer: Self-pay

## 2018-11-24 VITALS — BP 126/82 | HR 67 | Temp 97.5°F | Ht 67.0 in | Wt 218.2 lb

## 2018-11-24 DIAGNOSIS — G4733 Obstructive sleep apnea (adult) (pediatric): Secondary | ICD-10-CM

## 2018-11-24 DIAGNOSIS — Z9989 Dependence on other enabling machines and devices: Secondary | ICD-10-CM

## 2018-11-24 NOTE — Progress Notes (Signed)
PATIENT: Arick Maples DOB: 1970-09-03  REASON FOR VISIT: follow up HISTORY FROM: patient  HISTORY OF PRESENT ILLNESS: Today 11/24/18:  Mr. Boor is a 48 year old male with a history of obstructive sleep apnea on CPAP.  He returns today for follow-up.  His download indicates that he uses machine 30 out of 30 days for compliance of 100%.  He uses machine greater than 4 hours 29 days for compliance of 97%.  On average he uses his machine 6 hours and 41 minutes.  His residual AHI is 0.9 on 5 to 11 cm of water with EPR 3.  His leak in the 95th percentile at 5.7 liters per minute.  He reports that the CPAP has been beneficial for his fatigue.  He denies any new symptoms.  He returns today for evaluation.  HISTORY 3/9/2020CM Mr. Koepke, 48 year old male returns for follow-up with newly diagnosed obstructive sleep apnea here for CPAP compliance.  He reports it took him several months to get used to the machine but now he is doing okay he feels he sleeps better more rested.  Compliance data dated 04/19/2018-05/18/2018 shows compliance greater than 4 hours and 100%.  Average usage 6 hours 55 minutes set pressure 5 to 11 cm.  EPR level 3 leak 95th percentile 4.6 AHI 0.6.  He returns for reevaluation  REVIEW OF SYSTEMS: Out of a complete 14 system review of symptoms, the patient complains only of the following symptoms, and all other reviewed systems are negative.  See HPI Epworth sleepiness score 5 fatigue severity score 23  ALLERGIES: No Known Allergies  HOME MEDICATIONS: Outpatient Medications Prior to Visit  Medication Sig Dispense Refill  . atorvastatin (LIPITOR) 20 MG tablet TAKE 1 TABLET BY MOUTH  DAILY AT 6 PM 90 tablet 1  . cloNIDine (CATAPRES) 0.1 MG tablet Take 1 tablet (0.1 mg total) by mouth 2 (two) times daily. 180 tablet 4  . fenofibrate 160 MG tablet TAKE 1 TABLET BY MOUTH  DAILY 90 tablet 1  . fluticasone (FLONASE) 50 MCG/ACT nasal spray Place 2 sprays into both nostrils daily.  4   . multivitamin (ONE-A-DAY MEN'S) TABS Take 1 tablet by mouth daily.     No facility-administered medications prior to visit.     PAST MEDICAL HISTORY: Past Medical History:  Diagnosis Date  . Hyperlipidemia   . Hypertension     PAST SURGICAL HISTORY: Past Surgical History:  Procedure Laterality Date  . WISDOM TOOTH EXTRACTION      FAMILY HISTORY: Family History  Problem Relation Age of Onset  . Hypertension Father   . Kidney Stones Father   . Benign prostatic hyperplasia Father   . Diabetes Mother   . Cancer Mother        cholangiocarcinoma  . Prostate cancer Paternal Grandfather   . Cancer Maternal Grandmother        colon  . Colon cancer Maternal Grandmother   . Lung cancer Maternal Grandmother     SOCIAL HISTORY: Social History   Socioeconomic History  . Marital status: Married    Spouse name: Not on file  . Number of children: 2  . Years of education: BA  . Highest education level: Not on file  Occupational History    Employer: [employer]  . Financial resource strain: Not on file  . Food insecurity    Worry: Not on file    Inability: Not on file  . Transportation needs  Medical: Not on file    Non-medical: Not on file  Tobacco Use  . Smoking status: Former Smoker    Quit date: 03/17/2007    Years since quitting: 11.6  . Smokeless tobacco: Never Used  Substance and Sexual Activity  . Alcohol use: Yes    Comment: 2-3 beers per week  . Drug use: No  . Sexual activity: Not on file  Lifestyle  . Physical activity    Days per week: Not on file    Minutes per session: Not on file  . Stress: Not on file  Relationships  . Social Herbalist on phone: Not on file    Gets together: Not on file    Attends religious service: Not on file    Active member of club or organization: Not on file    Attends meetings of clubs or organizations: Not on file    Relationship status: Not on file  .  Intimate partner violence    Fear of current or ex partner: Not on file    Emotionally abused: Not on file    Physically abused: Not on file    Forced sexual activity: Not on file  Other Topics Concern  . Not on file  Social History Narrative   Married, twin girls.   Patient lives at home with his family.   Caffeine Use:       PHYSICAL EXAM  Vitals:   11/24/18 0746  BP: 126/82  Pulse: 67  Temp: (!) 97.5 F (36.4 C)  Weight: 218 lb 3.2 oz (99 kg)  Height: 5\' 7"  (1.702 m)   Body mass index is 34.17 kg/m.  Generalized: Well developed, in no acute distress  Chest: Lungs clear to auscultation bilaterally  Neurological examination  Mentation: Alert oriented to time, place, history taking. Follows all commands speech and language fluent Cranial nerve II-XII: PExtraocular movements were full, visual field were full on confrontational test.  Head turning and shoulder shrug  were normal and symmetric. Motor: The motor testing reveals 5 over 5 strength of all 4 extremities. Good symmetric motor tone is noted throughout.  Sensory: Sensory testing is intact to soft touch on all 4 extremities. No evidence of extinction is noted.   Gait and station: Gait is normal.   DIAGNOSTIC DATA (LABS, IMAGING, TESTING) - I reviewed patient records, labs, notes, testing and imaging myself where available.  Lab Results  Component Value Date   WBC 6.3 12/21/2017   HGB 14.8 12/21/2017   HCT 42.9 12/21/2017   MCV 82.2 12/21/2017   PLT 259.0 12/21/2017      Component Value Date/Time   NA 140 12/21/2017 1425   K 3.8 12/21/2017 1425   CL 103 12/21/2017 1425   CO2 29 12/21/2017 1425   GLUCOSE 114 (H) 12/21/2017 1425   BUN 16 12/21/2017 1425   CREATININE 1.43 12/21/2017 1425   CREATININE 1.01 11/02/2014 1550   CALCIUM 10.0 12/21/2017 1425   PROT 7.5 12/21/2017 1425   ALBUMIN 4.9 12/21/2017 1425   AST 26 12/21/2017 1425   ALT 38 12/21/2017 1425   ALKPHOS 40 12/21/2017 1425   BILITOT 0.6  12/21/2017 1425   GFRNONAA 105.09 08/15/2009 1052   Lab Results  Component Value Date   CHOL 163 12/21/2017   HDL 38.10 (L) 12/21/2017   LDLCALC 84 05/28/2017   LDLDIRECT 101.0 12/21/2017   TRIG 206.0 (H) 12/21/2017   CHOLHDL 4 12/21/2017   Lab Results  Component Value Date  HGBA1C 5.5 09/15/2010   No results found for: VITAMINB12 Lab Results  Component Value Date   TSH 1.22 11/26/2016      ASSESSMENT AND PLAN 48 y.o. year old male  has a past medical history of Hyperlipidemia and Hypertension. here with :  1.  Obstructive sleep apnea on CPAP  The patient's download shows excellent appliance and good treatment of his apnea.  He is advised if symptoms worsen or he develops new symptoms he should let us know.  He will follow-up in 1 year or sooner if needed.   I spent 15 minutes with the patient. 50% of this time was spent reviewing CPAP download   Ward Givens, MSN, NP-C 11/24/2018, 8:25 AM [facility] [address], [place], [place] [address] [phone]

## 2018-11-24 NOTE — Patient Instructions (Signed)
Continue using CPAP nightly and greater than 4 hours each night °If your symptoms worsen or you develop new symptoms please let us know.  ° °

## 2018-12-09 ENCOUNTER — Other Ambulatory Visit: Payer: Self-pay | Admitting: Family Medicine

## 2018-12-19 ENCOUNTER — Other Ambulatory Visit: Payer: Self-pay | Admitting: Family Medicine

## 2019-05-30 ENCOUNTER — Other Ambulatory Visit: Payer: Self-pay | Admitting: Family Medicine

## 2019-06-05 ENCOUNTER — Other Ambulatory Visit: Payer: Self-pay | Admitting: Family Medicine

## 2019-06-08 ENCOUNTER — Ambulatory Visit: Payer: 59 | Attending: Family

## 2019-06-08 DIAGNOSIS — Z23 Encounter for immunization: Secondary | ICD-10-CM

## 2019-06-08 NOTE — Progress Notes (Signed)
   Covid-19 Vaccination Clinic  Name:  Dakota Reid    MRN: [id number] DOB: Feb 18, 1971  06/08/2019  [name] was observed post Covid-19 immunization for 15 minutes without incident. He was provided with Vaccine Information Sheet and instruction to access the V-Safe system.   [name] was instructed to call 911 with any severe reactions post vaccine: Marland Kitchen Difficulty breathing  . Swelling of face and throat  . A fast heartbeat  . A bad rash all over body  . Dizziness and weakness   Immunizations Administered    Name Date Dose VIS Date Route   Moderna COVID-19 Vaccine 06/08/2019  4:14 PM 0.5 mL 02/14/2019 Intramuscular   Manufacturer: Moderna   Lot: [id number]   [id number]

## 2019-07-11 ENCOUNTER — Ambulatory Visit: Payer: 59

## 2019-07-11 ENCOUNTER — Encounter: Payer: Self-pay | Admitting: Family Medicine

## 2019-07-18 ENCOUNTER — Ambulatory Visit: Payer: 59 | Attending: Family

## 2019-07-18 DIAGNOSIS — Z23 Encounter for immunization: Secondary | ICD-10-CM

## 2019-07-18 NOTE — Progress Notes (Signed)
   Covid-19 Vaccination Clinic  Name:  Dakota Reid    MRN: [id number] DOB: 07-Jan-1971  07/18/2019  Dakota Reid was observed post Covid-19 immunization for 15 minutes without incident. He was provided with Vaccine Information Sheet and instruction to access the V-Safe system.   Dakota Reid was instructed to call 911 with any severe reactions post vaccine: Marland Kitchen Difficulty breathing  . Swelling of face and throat  . A fast heartbeat  . A bad rash all over body  . Dizziness and weakness   Immunizations Administered    Name Date Dose VIS Date Route   Moderna COVID-19 Vaccine 07/18/2019  3:35 PM 0.5 mL 02/2019 Intramuscular   Manufacturer: Levan Hurst   Lot: [id number]   [place]: 80777-273-99      Covid-19 Vaccination Clinic  Name:  Dakota Reid    MRN: [id number] DOB: 1970-05-18  07/18/2019  Dakota Reid was observed post Covid-19 immunization for 15 minutes without incident. He was provided with Vaccine Information Sheet and instruction to access the V-Safe system.   Dakota Reid was instructed to call 911 with any severe reactions post vaccine: Marland Kitchen Difficulty breathing  . Swelling of face and throat  . A fast heartbeat  . A bad rash all over body  . Dizziness and weakness   Immunizations Administered    Name Date Dose VIS Date Route   Moderna COVID-19 Vaccine 07/18/2019  3:35 PM 0.5 mL 02/2019 Intramuscular   Manufacturer: Moderna   Lot: [id number]   [place]:[id number]

## 2019-08-25 ENCOUNTER — Other Ambulatory Visit: Payer: Self-pay | Admitting: Diagnostic Neuroimaging

## 2019-08-29 ENCOUNTER — Telehealth: Payer: Self-pay | Admitting: *Deleted

## 2019-08-29 NOTE — Telephone Encounter (Signed)
Clonidine Rx faxed to [facility]. Confirmation received.

## 2019-11-14 ENCOUNTER — Other Ambulatory Visit: Payer: Self-pay | Admitting: Diagnostic Neuroimaging

## 2019-11-30 ENCOUNTER — Other Ambulatory Visit: Payer: Self-pay | Admitting: Diagnostic Neuroimaging

## 2020-04-02 ENCOUNTER — Other Ambulatory Visit: Payer: Self-pay | Admitting: Family Medicine

## 2020-04-02 ENCOUNTER — Telehealth: Payer: Self-pay

## 2020-04-02 NOTE — Telephone Encounter (Signed)
Pt must schedule appt- he has not been seen since 2019

## 2020-04-02 NOTE — Telephone Encounter (Signed)
Called and left patient a message to return call to office to schedule an appt per Dr. Birdie Riddle to continue Rx fenofibrate.

## 2020-04-25 ENCOUNTER — Other Ambulatory Visit: Payer: Self-pay | Admitting: Family Medicine

## 2020-09-11 ENCOUNTER — Encounter: Payer: Self-pay | Admitting: *Deleted

## 2020-11-29 ENCOUNTER — Other Ambulatory Visit: Payer: Self-pay | Admitting: Family Medicine

## 2020-11-29 ENCOUNTER — Other Ambulatory Visit: Payer: Self-pay | Admitting: Diagnostic Neuroimaging

## 2020-12-08 ENCOUNTER — Other Ambulatory Visit: Payer: Self-pay | Admitting: Diagnostic Neuroimaging

## 2020-12-11 ENCOUNTER — Encounter: Payer: Self-pay | Admitting: Family Medicine

## 2020-12-11 ENCOUNTER — Ambulatory Visit (INDEPENDENT_AMBULATORY_CARE_PROVIDER_SITE_OTHER): Payer: 59 | Admitting: Family Medicine

## 2020-12-11 ENCOUNTER — Other Ambulatory Visit: Payer: Self-pay

## 2020-12-11 VITALS — BP 130/80 | HR 86 | Temp 98.1°F | Resp 18 | Ht 68.8 in | Wt 219.8 lb

## 2020-12-11 DIAGNOSIS — Z0000 Encounter for general adult medical examination without abnormal findings: Secondary | ICD-10-CM

## 2020-12-11 DIAGNOSIS — Z23 Encounter for immunization: Secondary | ICD-10-CM

## 2020-12-11 DIAGNOSIS — Z114 Encounter for screening for human immunodeficiency virus [HIV]: Secondary | ICD-10-CM | POA: Diagnosis not present

## 2020-12-11 DIAGNOSIS — G44209 Tension-type headache, unspecified, not intractable: Secondary | ICD-10-CM | POA: Diagnosis not present

## 2020-12-11 DIAGNOSIS — E785 Hyperlipidemia, unspecified: Secondary | ICD-10-CM

## 2020-12-11 DIAGNOSIS — Z1159 Encounter for screening for other viral diseases: Secondary | ICD-10-CM

## 2020-12-11 DIAGNOSIS — Z9989 Dependence on other enabling machines and devices: Secondary | ICD-10-CM | POA: Diagnosis not present

## 2020-12-11 DIAGNOSIS — Z125 Encounter for screening for malignant neoplasm of prostate: Secondary | ICD-10-CM

## 2020-12-11 DIAGNOSIS — G4733 Obstructive sleep apnea (adult) (pediatric): Secondary | ICD-10-CM | POA: Diagnosis not present

## 2020-12-11 DIAGNOSIS — Z1211 Encounter for screening for malignant neoplasm of colon: Secondary | ICD-10-CM

## 2020-12-11 LAB — BASIC METABOLIC PANEL
BUN: 11 mg/dL (ref 6–23)
CO2: 25 mEq/L (ref 19–32)
Calcium: 9.9 mg/dL (ref 8.4–10.5)
Chloride: 102 mEq/L (ref 96–112)
Creatinine, Ser: 1.02 mg/dL (ref 0.40–1.50)
GFR: 87.17 mL/min (ref >60.00)
Glucose, Bld: 122 mg/dL — ABNORMAL HIGH (ref 70–99)
Potassium: 3.9 mEq/L (ref 3.5–5.1)
Sodium: 138 mEq/L (ref 135–145)

## 2020-12-11 LAB — CBC WITH DIFFERENTIAL/PLATELET
Basophils Absolute: 0 10*3/uL (ref 0.0–0.1)
Basophils Relative: 0.4 % (ref 0.0–3.0)
Eosinophils Absolute: 0.2 10*3/uL (ref 0.0–0.7)
Eosinophils Relative: 2 % (ref 0.0–5.0)
HCT: 50.1 % (ref 39.0–52.0)
Hemoglobin: 17.3 g/dL — ABNORMAL HIGH (ref 13.0–17.0)
Lymphocytes Relative: 25.3 % (ref 12.0–46.0)
Lymphs Abs: 2.4 10*3/uL (ref 0.7–4.0)
MCHC: 34.5 g/dL (ref 30.0–36.0)
MCV: 82 fl (ref 78.0–100.0)
Monocytes Absolute: 0.8 10*3/uL (ref 0.1–1.0)
Monocytes Relative: 8.4 % (ref 3.0–12.0)
Neutro Abs: 5.9 10*3/uL (ref 1.4–7.7)
Neutrophils Relative %: 63.9 % (ref 43.0–77.0)
Platelets: 237 10*3/uL (ref 150.0–400.0)
RBC: 6.11 Mil/uL — ABNORMAL HIGH (ref 4.22–5.81)
RDW: 13.5 % (ref 11.5–15.5)
WBC: 9.3 10*3/uL (ref 4.0–10.5)

## 2020-12-11 LAB — LIPID PANEL
Cholesterol: 166 mg/dL (ref 0–200)
HDL: 45.2 mg/dL (ref >39.00)
LDL Cholesterol: 85 mg/dL (ref 0–99)
NonHDL: 120.66
Total CHOL/HDL Ratio: 4
Triglycerides: 180 mg/dL — ABNORMAL HIGH (ref 0.0–149.0)
VLDL: 36 mg/dL (ref 0.0–40.0)

## 2020-12-11 LAB — HEPATIC FUNCTION PANEL
ALT: 63 U/L — ABNORMAL HIGH (ref 0–53)
AST: 32 U/L (ref 0–37)
Albumin: 5 g/dL (ref 3.5–5.2)
Alkaline Phosphatase: 67 U/L (ref 39–117)
Bilirubin, Direct: 0.2 mg/dL (ref 0.0–0.3)
Total Bilirubin: 0.8 mg/dL (ref 0.2–1.2)
Total Protein: 7.7 g/dL (ref 6.0–8.3)

## 2020-12-11 LAB — TSH: TSH: 1.66 u[IU]/mL (ref 0.35–5.50)

## 2020-12-11 LAB — PSA: PSA: 1.38 ng/mL (ref 0.10–4.00)

## 2020-12-11 MED ORDER — CLONIDINE HCL 0.1 MG PO TABS: 0.1000 mg | ORAL_TABLET | Freq: Two times a day (BID) | ORAL | 1 refills | Status: DC

## 2020-12-11 MED ORDER — METHOCARBAMOL 500 MG PO TABS: 500.0000 mg | ORAL_TABLET | Freq: Three times a day (TID) | ORAL | 3 refills | Status: DC | PRN

## 2020-12-11 NOTE — Assessment & Plan Note (Signed)
Pt reports increased SOB recently- particularly when lying down or wearing CPAP.  Will refer to pulmonary for re-evaluation

## 2020-12-11 NOTE — Progress Notes (Signed)
   Subjective:    Patient ID: Dakota Reid, male    DOB: 23-May-1970, 50 y.o.   MRN: [id number]  HPI CPE- Due for Tdap, flu, colon cancer screen.  Health Maintenance  Topic Date Due   Hepatitis C Screening  Never done   COLONOSCOPY (Pts 45-72yrs Insurance coverage will need to be confirmed)  Never done   COVID-19 Vaccine (3 - Moderna risk series) 08/15/2019   TETANUS/TDAP  09/14/2020   INFLUENZA VACCINE  10/14/2020   HIV Screening  12/11/2021 (Originally 11/14/1987)   HPV VACCINES  Aged Out     Patient Care Team    Relationship Specialty Notifications Start End  Midge Minium, MD PCP - General   86/76/19   Jodi Marble, MD Consulting Physician Otolaryngology  11/01/14   Penni Bombard, MD Consulting Physician Neurology  11/01/14       Review of Systems Patient reports no vision/hearing changes, anorexia, fever ,adenopathy, persistant/recurrent hoarseness, swallowing issues, chest pain, palpitations, edema, persistant/recurrent cough, hemoptysis, gastrointestinal  bleeding (melena, rectal bleeding), abdominal pain, excessive heart burn, GU symptoms (dysuria, hematuria, voiding/incontinence issues) syncope, focal weakness, memory loss, numbness & tingling, skin/hair/nail changes, depression, anxiety, abnormal bruising/bleeding, musculoskeletal symptoms/signs.   + tension headaches- taking ibuprofen 4x/week L hip pain- will have a 'sharp stabbing pain' if he stops suddenly or twists Pt has stopped wearing CPAP b/c he feels that he can't get a deep breath or breathe when wearing the machine.  + subjective SOB- pulse ox WNL  This visit occurred during the SARS-CoV-2 public health emergency.  Safety protocols were in place, including screening questions prior to the visit, additional usage of staff PPE, and extensive cleaning of exam room while observing appropriate contact time as indicated for disinfecting solutions.      Objective:   Physical Exam General Appearance:     Alert, cooperative, no distress, appears stated age, obese  Head:    Normocephalic, without obvious abnormality, atraumatic  Eyes:    PERRL, conjunctiva/corneas clear, EOM's intact, fundi    benign, both eyes       Ears:    Normal TM's and external ear canals, both ears  Nose:   Deferred due to COVID  Throat:   Neck:   Supple, symmetrical, trachea midline, no adenopathy;       thyroid:  No enlargement/tenderness/nodules  Back:     Symmetric, no curvature, ROM normal, no CVA tenderness  Lungs:     Clear to auscultation bilaterally, respirations unlabored  Chest wall:    No tenderness or deformity  Heart:    Regular rate and rhythm, S1 and S2 normal, no murmur, rub   or gallop  Abdomen:     Soft, non-tender, bowel sounds active all four quadrants,    no masses, no organomegaly  Genitalia:    deferred  Rectal:    Extremities:   Extremities normal, atraumatic, no cyanosis or edema  Pulses:   2+ and symmetric all extremities  Skin:   Skin color, texture, turgor normal, no rashes or lesions  Lymph nodes:   Cervical, supraclavicular, and axillary nodes normal  Neurologic:   CNII-XII intact. Normal strength, sensation and reflexes      throughout          Assessment & Plan:

## 2020-12-11 NOTE — Patient Instructions (Signed)
Follow up in 6 months to recheck cholesterol We'll notify you of your lab results and make any changes if needed Continue to work on healthy diet and regular exercise- you can do it! We'll call you with your Pulmonary and GI appts USE the Robaxin (Methocarbamol) for tension headaches/muscle spasm HEAT! If the hip pain worsens, let me know Call with any questions or concerns Stay Safe!  Stay Healthy! Happy Fall!!

## 2020-12-11 NOTE — Assessment & Plan Note (Signed)
Deteriorated.  Discussed ergonomics of work station, attention to posture.  Will provide Robaxin to use PRN.

## 2020-12-11 NOTE — Assessment & Plan Note (Signed)
Chronic problem.  On Lipitor and Fenofibrate.  Was out of fenofibrate for awhile but recently restarted.  Check labs.  Adjust meds prn

## 2020-12-11 NOTE — Assessment & Plan Note (Signed)
Pt's PE WNL w/ exception of obesity.  Due for colon cancer screening- GI referral placed.  Tdap given, flu shot given.  Check labs.  Anticipatory guidance provided.

## 2020-12-12 ENCOUNTER — Other Ambulatory Visit (INDEPENDENT_AMBULATORY_CARE_PROVIDER_SITE_OTHER): Payer: 59

## 2020-12-12 DIAGNOSIS — R7309 Other abnormal glucose: Secondary | ICD-10-CM

## 2020-12-12 LAB — HEPATITIS C ANTIBODY
Hepatitis C Ab: NONREACTIVE
SIGNAL TO CUT-OFF: 0.02 (ref <1.00)

## 2020-12-12 LAB — HIV ANTIBODY (ROUTINE TESTING W REFLEX): HIV 1&2 Ab, 4th Generation: NONREACTIVE

## 2020-12-12 LAB — HEMOGLOBIN A1C: Hgb A1c MFr Bld: 5.9 % (ref 4.6–6.5)

## 2020-12-12 NOTE — Addendum Note (Signed)
Addended by: Octavio Manns E on: 12/12/2020 01:43 PM   Modules accepted: Orders

## 2020-12-31 ENCOUNTER — Encounter: Payer: Self-pay | Admitting: Family Medicine

## 2021-01-02 ENCOUNTER — Encounter: Payer: Self-pay | Admitting: Family Medicine

## 2021-01-20 ENCOUNTER — Emergency Department (HOSPITAL_COMMUNITY)
Admission: EM | Admit: 2021-01-20 | Discharge: 2021-01-20 | Disposition: A | Discharge status: Home / Self Care | Payer: 59 | Attending: Emergency Medicine | Admitting: Emergency Medicine

## 2021-01-20 ENCOUNTER — Other Ambulatory Visit: Payer: Self-pay

## 2021-01-20 ENCOUNTER — Emergency Department (HOSPITAL_COMMUNITY): Payer: 59

## 2021-01-20 ENCOUNTER — Encounter (HOSPITAL_COMMUNITY): Payer: Self-pay | Admitting: Emergency Medicine

## 2021-01-20 DIAGNOSIS — Z87891 Personal history of nicotine dependence: Secondary | ICD-10-CM | POA: Diagnosis not present

## 2021-01-20 DIAGNOSIS — N2889 Other specified disorders of kidney and ureter: Secondary | ICD-10-CM | POA: Insufficient documentation

## 2021-01-20 DIAGNOSIS — Z79899 Other long term (current) drug therapy: Secondary | ICD-10-CM | POA: Insufficient documentation

## 2021-01-20 DIAGNOSIS — R109 Unspecified abdominal pain: Secondary | ICD-10-CM | POA: Diagnosis present

## 2021-01-20 DIAGNOSIS — N200 Calculus of kidney: Secondary | ICD-10-CM | POA: Diagnosis not present

## 2021-01-20 DIAGNOSIS — I10 Essential (primary) hypertension: Secondary | ICD-10-CM | POA: Diagnosis not present

## 2021-01-20 LAB — BASIC METABOLIC PANEL
Anion gap: 10 (ref 5–15)
BUN: 16 mg/dL (ref 6–20)
CO2: 25 mmol/L (ref 22–32)
Calcium: 9.3 mg/dL (ref 8.9–10.3)
Chloride: 103 mmol/L (ref 98–111)
Creatinine, Ser: 1.25 mg/dL — ABNORMAL HIGH (ref 0.61–1.24)
GFR, Estimated: >60 mL/min (ref >60)
Glucose, Bld: 155 mg/dL — ABNORMAL HIGH (ref 70–99)
Potassium: 3.6 mmol/L (ref 3.5–5.1)
Sodium: 138 mmol/L (ref 135–145)

## 2021-01-20 NOTE — ED Triage Notes (Signed)
Patient reports right flank / mid abdominal pain radiating to right groin/right testicle onset this evening . NO emesis or diarrhea , denies hematuria or dysuria .

## 2021-01-20 NOTE — ED Provider Notes (Signed)
MSE was initiated and I personally evaluated the patient and placed orders (if any) at  5:06 AM on January 20, 2021.  Patient awakened tonight from sleep with onset right flank pain now radiating to RLQ abdomen and into groin. No history of Kidney stones. No hematuria or fever. Took ibuprofen with moderate relief.   Today's Vitals   01/20/21 0501  BP: (!) 169/99  Pulse: 66  Resp: 20  Temp: 98.1 F (36.7 C)  SpO2: 99%   There is no height or weight on file to calculate BMI.  No reproducible flank or abdominal tenderness.   The patient appears stable so that the remainder of the MSE may be completed by another provider.   Charlann Lange, PA-C 01/20/21 0507    Ezequiel Essex, MD 01/20/21 [phone]

## 2021-01-20 NOTE — ED Provider Notes (Signed)
[facility] EMERGENCY DEPARTMENT Provider Note   CSN: [id number] Arrival date & time: 01/20/21  0445     History Chief Complaint  Patient presents with   Flank /Abdominal Pain     Dakota Reid is a 50 y.o. male with PMH HTN, HLD who presents emergency department for evaluation of right flank pain.  Patient states that pain began early this morning with acute onset right flank pain, 10 out of 10, that radiates down into the perineum.  Denies nausea, vomiting, fever, chest pain, shortness of breath or other systemic symptoms.  Patient states that since waiting in the lobby, resolved and he is urinated multiple times while waiting.  HPI     Past Medical History:  Diagnosis Date   Hyperlipidemia    Hypertension     Patient Active Problem List   Diagnosis Date Noted   Obstructive sleep apnea treated with continuous positive airway pressure (CPAP) 05/23/2018   Obesity (BMI 30.0-34.9) 09/24/2016   Chronic motor or vocal tic disorder 07/29/2016   OSA (obstructive sleep apnea) 07/29/2016   Low testosterone 05/06/2015   Erectile dysfunction 05/06/2015   Accessory skin tags 11/02/2014   Decreased libido 07/20/2013   Involuntary muscle contractions 03/03/2012   Tension headache 03/03/2012   General medical examination 09/15/2010   MOLE 08/26/2009   Hyperlipidemia 08/26/2009    Past Surgical History:  Procedure Laterality Date   WISDOM TOOTH EXTRACTION         Family History  Problem Relation Age of Onset   Hypertension Father    Kidney Stones Father    Benign prostatic hyperplasia Father    Diabetes Mother    Cancer Mother        cholangiocarcinoma   Prostate cancer Paternal Grandfather    Cancer Maternal Grandmother        colon   Colon cancer Maternal Grandmother    Lung cancer Maternal Grandmother     Social History   Tobacco Use   Smoking status: Former    Types: Cigarettes    Quit date: 03/17/2007    Years since quitting: 13.8    Smokeless tobacco: Never  Vaping Use   Vaping Use: Never used  Substance Use Topics   Alcohol use: Yes    Comment: 2-3 beers per week   Drug use: No    Home Medications Prior to Admission medications   Medication Sig Start Date End Date Taking? Authorizing Provider  atorvastatin (LIPITOR) 20 MG tablet TAKE 1 TABLET BY MOUTH  DAILY AT 6 PM 04/26/20   Midge Minium, MD  cloNIDine (CATAPRES) 0.1 MG tablet Take 1 tablet (0.1 mg total) by mouth 2 (two) times daily. 12/11/20   Midge Minium, MD  fenofibrate 160 MG tablet TAKE 1 TABLET BY MOUTH  DAILY 11/29/20   Midge Minium, MD  fluticasone [facility]) 50 MCG/ACT nasal spray Place 2 sprays into both nostrils daily. 03/30/14   [provider]  methocarbamol (ROBAXIN) 500 MG tablet Take 1 tablet (500 mg total) by mouth every 8 (eight) hours as needed for muscle spasms. 12/11/20   Midge Minium, MD  multivitamin (ONE-A-DAY MEN'S) TABS Take 1 tablet by mouth daily.    [provider]    Allergies    Patient has no known allergies.  Review of Systems   Review of Systems  Constitutional:  Negative for chills and fever.  HENT:  Negative for ear pain and sore throat.   Eyes:  Negative for pain and  visual disturbance.  Respiratory:  Negative for cough and shortness of breath.   Cardiovascular:  Negative for chest pain and palpitations.  Gastrointestinal:  Negative for abdominal pain and vomiting.  Genitourinary:  Positive for flank pain. Negative for dysuria and hematuria.  Musculoskeletal:  Negative for arthralgias and back pain.  Skin:  Negative for color change and rash.  Neurological:  Negative for seizures and syncope.  All other systems reviewed and are negative.  Physical Exam Updated Vital Signs BP (!) 175/100 (BP Location: Right Arm)   Pulse 68   Temp 98.1 F (36.7 C) (Oral)   Resp 16   Ht 5\' 7"  (1.702 m)   Wt 105 kg   SpO2 100%   BMI 36.26 kg/m   Physical Exam Vitals and nursing note  reviewed.  Constitutional:      Appearance: He is well-developed.  HENT:     Head: Normocephalic and atraumatic.  Eyes:     Conjunctiva/sclera: Conjunctivae normal.  Cardiovascular:     Rate and Rhythm: Normal rate and regular rhythm.     Heart sounds: No murmur heard. Pulmonary:     Effort: Pulmonary effort is normal. No respiratory distress.     Breath sounds: Normal breath sounds.  Abdominal:     Palpations: Abdomen is soft.     Tenderness: There is no abdominal tenderness.  Musculoskeletal:     Cervical back: Neck supple.  Skin:    General: Skin is warm and dry.  Neurological:     Mental Status: He is alert.    ED Results / Procedures / Treatments   Labs (all labs ordered are listed, but only abnormal results are displayed) Labs Reviewed  BASIC METABOLIC PANEL - Abnormal; Notable for the following components:      Result Value   Glucose, Bld 155 (*)    Creatinine, Ser 1.25 (*)    All other components within normal limits  URINALYSIS, ROUTINE W REFLEX MICROSCOPIC    EKG None  Radiology CT Renal Stone Study  Result Date: 01/20/2021 CLINICAL DATA:  Right flank pain EXAM: CT ABDOMEN AND PELVIS WITHOUT CONTRAST TECHNIQUE: Multidetector CT imaging of the abdomen and pelvis was performed following the standard protocol without IV contrast. COMPARISON:  None. FINDINGS: Lower chest: No acute abnormality. Hepatobiliary: No focal liver abnormality is seen. No gallstones, gallbladder wall thickening, or biliary dilatation. Pancreas: Unremarkable. No pancreatic ductal dilatation or surrounding inflammatory changes. Spleen: No splenic injury or perisplenic hematoma. Adrenals/Urinary Tract: Positive for a punctate stone at the right UVJ. No significant hydronephrosis. There may be mild residual renal edema and trace perinephric stranding. Importantly, there is a contour abnormality arising from the medial aspect of the lower pole of the right kidney measuring approximately 4.1 x 4.8 x  3.5 cm concerning for a solid renal neoplasm. The left kidney is unremarkable in appearance. The adrenal glands are normal. The bladder is decompressed. Stomach/Bowel: Stomach is within normal limits. Appendix appears normal. No evidence of bowel wall thickening, distention, or inflammatory changes. Vascular/Lymphatic: Limited evaluation in the absence of intravenous contrast. No aneurysm. No significant atherosclerotic calcifications. No evidence of abnormal lymphadenopathy. Reproductive: Prostate is unremarkable. Other: No abdominal wall hernia or abnormality. No abdominopelvic ascites. Musculoskeletal: No acute fracture or aggressive appearing lytic or blastic osseous lesion. IMPRESSION: 1. Positive for tiny punctate nonobstructing stone at the right ureterovesicular junction. There may be minimal right renal edema and perinephric stranding but no evidence of hydronephrosis. 2. Abnormal exophytic solid-appearing mass arising from the medial aspect  of the lower pole of the right kidney highly concerning for a possible incidental renal cell carcinoma. Recommend further evaluation with non emergent (outpatient) gadolinium-enhanced MRI of the abdomen. These results were called by telephone at the time of interpretation on 01/20/2021 at 6:42 am to provider Kennith Maes, who verbally acknowledged these results. Electronically Signed   By: Jacqulynn Cadet M.D.   On: 01/20/2021 06:46    Procedures Procedures   Medications Ordered in ED Medications - No data to display  ED Course  I have reviewed the triage vital signs and the nursing notes.  Pertinent labs & imaging results that were available during my care of the patient were reviewed by me and considered in my medical decision making (see chart for details).    MDM Rules/Calculators/A&P                           Patient seen the emergency department for evaluation of right flank pain.  Physical exam is unremarkable.  Laboratory evaluation  reveals a mild creatinine elevation to 1.25.  CT stone study with a tiny punctate nonobstructing stone at the right UVJ.  No hydronephrosis.  There is also an abnormal exophytic solid-appearing mass in the lower pole of the right kidney concerning for an incidental renal cell carcinoma.  I spoke with the patient at length about this finding and we reviewed the imaging together.  I sent a follow-up message to his primary care provider who will help arrange urology follow-up and set up an outpatient MRI.  I suspect the patient's stone is already passed due to resolution of his pain and patient was then discharged with outpatient follow-up and need for outpatient MRI. Final Clinical Impression(s) / ED Diagnoses Final diagnoses:  Renal mass  Nephrolithiasis    Rx / DC Orders ED Discharge Orders     None        Reginaldo Hazard, MD 01/20/21 1424

## 2021-01-21 ENCOUNTER — Other Ambulatory Visit: Payer: Self-pay | Admitting: Family Medicine

## 2021-01-21 ENCOUNTER — Ambulatory Visit (HOSPITAL_COMMUNITY)
Admission: RE | Admit: 2021-01-21 | Discharge: 2021-01-21 | Disposition: A | Discharge status: Home / Self Care | Payer: 59 | Source: Ambulatory Visit | Attending: Family Medicine | Admitting: Family Medicine

## 2021-01-21 DIAGNOSIS — N2889 Other specified disorders of kidney and ureter: Secondary | ICD-10-CM | POA: Insufficient documentation

## 2021-01-21 MED ORDER — GADOBUTROL 1 MMOL/ML IV SOLN
10.0000 mL | Freq: Once | INTRAVENOUS | Status: AC | PRN
  Administered 2021-01-21: 10 mL via INTRAVENOUS

## 2021-01-21 NOTE — Addendum Note (Signed)
Addended by: Midge Minium on: 01/21/2021 09:32 AM   Modules accepted: Orders

## 2021-01-21 NOTE — Progress Notes (Signed)
MRI ordered and Urology referral placed as f/u to recent ER visit in which renal mass was discovered.

## 2021-01-22 ENCOUNTER — Encounter: Payer: Self-pay | Admitting: Family Medicine

## 2021-01-22 ENCOUNTER — Ambulatory Visit (INDEPENDENT_AMBULATORY_CARE_PROVIDER_SITE_OTHER): Payer: 59 | Admitting: Family Medicine

## 2021-01-22 VITALS — BP 145/98 | HR 69 | Temp 98.1°F | Resp 18 | Ht 67.0 in | Wt 221.8 lb

## 2021-01-22 DIAGNOSIS — R030 Elevated blood-pressure reading, without diagnosis of hypertension: Secondary | ICD-10-CM

## 2021-01-22 DIAGNOSIS — N2889 Other specified disorders of kidney and ureter: Secondary | ICD-10-CM | POA: Diagnosis not present

## 2021-01-22 NOTE — Progress Notes (Signed)
   Subjective:    Patient ID: Dakota Reid, male    DOB: 08/07/1970, 50 y.o.   MRN: [id number]  HPI ER f/u- pt went to ER on 11/7 w/ severe abd pain consistent w/ kidney stone.  Pt had CT that did show small stone but more importantly showed 4.1x 3.4 cm lesion of lower pole of R kidney- concerning for renal cell carcinoma.  He had an MRI yesterday to better assess/evaluate and the findings were concordant.  Has Urology referral and appt is scheduled for Friday.  No longer having pain, no longer having blood in urine.  This was first instance of blood in urine- occurred in ER.  Elevated BP- BP was quite high in the ER (170s/100s).  Wife reports pt will flush, get hot, have HAs and home BPs also run in the 170s.     Review of Systems For ROS see HPI   This visit occurred during the SARS-CoV-2 public health emergency.  Safety protocols were in place, including screening questions prior to the visit, additional usage of staff PPE, and extensive cleaning of exam room while observing appropriate contact time as indicated for disinfecting solutions.      Objective:   Physical Exam Vitals reviewed.  Constitutional:      General: He is not in acute distress.    Appearance: Normal appearance. He is not ill-appearing.  HENT:     Head: Normocephalic and atraumatic.  Skin:    General: Skin is warm and dry.  Neurological:     General: No focal deficit present.     Mental Status: He is alert and oriented to person, place, and time.  Psychiatric:        Mood and Affect: Mood normal.        Behavior: Behavior normal.        Thought Content: Thought content normal.          Assessment & Plan:   Renal mass- new.  The ER notified me on Monday of pt's new renal mass.  In that time, we were able to get the f/u MRI and secure a urology appt for Friday.  Pt and wife are aware that this could be cancer and are prepared to face that once they have a formal diagnosis.  Offered my support and assistance  as they move through this process.  Elevated BP- new.  Likely due to stress of current situation, but wife indicates this occurs multiple times per week at home as well.  Once we have a plan regarding the kidney mass, we can revisit the idea of HTN.  Pt expressed understanding and is in agreement w/ plan.

## 2021-01-28 ENCOUNTER — Telehealth: Payer: Self-pay | Admitting: Family Medicine

## 2021-01-28 NOTE — Telephone Encounter (Signed)
Spoke to patient and made him aware. He understood

## 2021-01-28 NOTE — Telephone Encounter (Signed)
Pt wanted to make Tabori aware that he when to the Urologist on Friday. He states that they will be removing his Kidney. He states that it won't be done until around christmas time. He wanted to make Tabori aware of this and see if she has any insight as to why it is going to take that long.   Please advise

## 2021-01-28 NOTE — Telephone Encounter (Signed)
Pt called with FYI about urology visit

## 2021-01-28 NOTE — Telephone Encounter (Signed)
I am not sure why it would take that long unless there is an issue w/ OR space/scheduling.  I would encourage him to call the Urologist back and see if this can be done sooner.

## 2021-01-30 ENCOUNTER — Ambulatory Visit (HOSPITAL_COMMUNITY)
Admission: RE | Admit: 2021-01-30 | Discharge: 2021-01-30 | Disposition: A | Discharge status: Home / Self Care | Payer: 59 | Source: Ambulatory Visit | Attending: Urology | Admitting: Urology

## 2021-01-30 ENCOUNTER — Other Ambulatory Visit (HOSPITAL_COMMUNITY): Payer: Self-pay | Admitting: Urology

## 2021-01-30 ENCOUNTER — Other Ambulatory Visit: Payer: Self-pay

## 2021-01-30 DIAGNOSIS — D49511 Neoplasm of unspecified behavior of right kidney: Secondary | ICD-10-CM | POA: Insufficient documentation

## 2021-01-30 DIAGNOSIS — I82220 Acute embolism and thrombosis of inferior vena cava: Secondary | ICD-10-CM | POA: Insufficient documentation

## 2021-02-04 ENCOUNTER — Other Ambulatory Visit: Payer: Self-pay | Admitting: Urology

## 2021-02-04 ENCOUNTER — Other Ambulatory Visit: Payer: Self-pay | Admitting: Family Medicine

## 2021-02-17 ENCOUNTER — Other Ambulatory Visit (HOSPITAL_COMMUNITY): Payer: Self-pay

## 2021-02-17 NOTE — Patient Instructions (Signed)
DUE TO COVID-19 ONLY ONE VISITOR IS ALLOWED TO COME WITH YOU AND STAY IN THE WAITING ROOM ONLY DURING PRE OP AND PROCEDURE DAY OF SURGERY.   Up to two visitors ages 16+ are allowed at one time in a patient's room.  The visitors may rotate out with other people throughout the day.  Additionally, up to two children between the ages of 28 and 28 are allowed and do not count toward the number of allowed visitors.  Children within this age range must be accompanied by an adult visitor.  One adult visitor may remain with the patient overnight and must be in the room by 8 PM.  YOU NEED TO HAVE A COVID 19 TEST ON__12-9-22_____ between 8am-3pm______, THIS TEST MUST BE DONE BEFORE SURGERY,     Please bring completed form with you to the COVID testing site   COVID TESTING SITE [place] TEST IS COMPLETED,  PLEASE Wear a mask when in public           Your procedure is scheduled on: 02-25-21   Report to [facility] Main  Entrance   Report to admitting at      [place]  AM     Call this number if you have problems the morning of surgery [phone]   Remember: Follow a clear liquid diet the day before your surgery you may have clear liquids until 0430 am then nothing by mouth    CLEAR LIQUID DIET                                                                    water Black Coffee and tea, regular and decaf No Creamer                            Plain Jell-O any favor except red or purple                                  Fruit ices (not with fruit pulp)                                      Iced Popsicles                                     Carbonated beverages, regular and diet                                    Cranberry, grape and apple juices Sports drinks like Gatorade Lightly seasoned clear broth or consume(fat free) Sugar, honey syrup  Sample Menu Breakfast                                Lunch  Supper Cranberry juice                    Beef broth                            Chicken broth Jell-O                                     Grape juice                           Apple juice Coffee or tea                        Jell-O                                      Popsicle                                                Coffee or tea                        Coffee or tea  _____________________________________________________________________     BRUSH YOUR TEETH MORNING OF SURGERY AND RINSE YOUR MOUTH OUT, NO CHEWING GUM CANDY OR MINTS.     Take these medicines the morning of surgery with A SIP OF WATER: fenofibrate, clonidine, Flonase, atorvastatin                                 You may not have any metal on your body including hair pins and              piercings  Do not wear jewelry,  lotions, powders,perfumes,   or     deodorant                   Men may shave face and neck.   Do not bring valuables to the hospital. [place].  Contacts, dentures or bridgework may not be worn into surgery.  You may bring a small overnight bag with you     Patients discharged the day of surgery will not be allowed to drive home. IF YOU ARE HAVING SURGERY AND GOING HOME THE SAME DAY, YOU MUST HAVE AN ADULT TO DRIVE YOU HOME AND BE WITH YOU FOR 24 HOURS. YOU MAY GO HOME BY TAXI OR UBER OR ORTHERWISE, BUT AN ADULT MUST ACCOMPANY YOU HOME AND STAY WITH YOU FOR 24 HOURS.  Name and phone number of your driver:  Special Instructions: N/A              Please read over the following fact sheets you were given: _____________________________________________________________________             [facility] - Preparing for Surgery Before surgery, you can play an important role.  Because skin is not sterile, your skin needs to be as free of germs as  possible.  You can reduce the number of germs on your skin by washing with CHG (chlorahexidine  gluconate) soap before surgery.  CHG is an antiseptic cleaner which kills germs and bonds with the skin to continue killing germs even after washing. Please DO NOT use if you have an allergy to CHG or antibacterial soaps.  If your skin becomes reddened/irritated stop using the CHG and inform your nurse when you arrive at Short Stay. Do not shave (including legs and underarms) for at least 48 hours prior to the first CHG shower.  You may shave your face/neck. Please follow these instructions carefully:  1.  Shower with CHG Soap the night before surgery and the  morning of Surgery.  2.  If you choose to wash your hair, wash your hair first as usual with your  normal  shampoo.  3.  After you shampoo, rinse your hair and body thoroughly to remove the  shampoo.                           4.  Use CHG as you would any other liquid soap.  You can apply chg directly  to the skin and wash                       Gently with a scrungie or clean washcloth.  5.  Apply the CHG Soap to your body ONLY FROM THE NECK DOWN.   Do not use on face/ open                           Wound or open sores. Avoid contact with eyes, ears mouth and genitals (private parts).                       Wash face,  Genitals (private parts) with your normal soap.             6.  Wash thoroughly, paying special attention to the area where your surgery  will be performed.  7.  Thoroughly rinse your body with warm water from the neck down.  8.  DO NOT shower/wash with your normal soap after using and rinsing off  the CHG Soap.                9.  Pat yourself dry with a clean towel.            10.  Wear clean pajamas.            11.  Place clean sheets on your bed the night of your first shower and do not  sleep with pets. Day of Surgery : Do not apply any lotions/deodorants the morning of surgery.  Please wear clean clothes to the hospital/surgery center.  FAILURE TO FOLLOW THESE INSTRUCTIONS MAY RESULT IN THE CANCELLATION OF YOUR  SURGERY PATIENT SIGNATURE_________________________________  NURSE SIGNATURE__________________________________  ________________________________________________________________________    Dakota Reid  An incentive spirometer is a tool that can help keep your lungs clear and active. This tool measures how well you are filling your lungs with each breath. Taking long deep breaths may help reverse or decrease the chance of developing breathing (pulmonary) problems (especially infection) following: A long period of time when you are unable to move or be active. BEFORE THE PROCEDURE  If the spirometer includes an indicator to show your best effort, your nurse or respiratory therapist  will set it to a desired goal. If possible, sit up straight or lean slightly forward. Try not to slouch. Hold the incentive spirometer in an upright position. INSTRUCTIONS FOR USE  Sit on the edge of your bed if possible, or sit up as far as you can in bed or on a chair. Hold the incentive spirometer in an upright position. Breathe out normally. Place the mouthpiece in your mouth and seal your lips tightly around it. Breathe in slowly and as deeply as possible, raising the piston or the ball toward the top of the column. Hold your breath for 3-5 seconds or for as long as possible. Allow the piston or ball to fall to the bottom of the column. Remove the mouthpiece from your mouth and breathe out normally. Rest for a few seconds and repeat Steps 1 through 7 at least 10 times every 1-2 hours when you are awake. Take your time and take a few normal breaths between deep breaths. The spirometer may include an indicator to show your best effort. Use the indicator as a goal to work toward during each repetition. After each set of 10 deep breaths, practice coughing to be sure your lungs are clear. If you have an incision (the cut made at the time of surgery), support your incision when coughing by placing a pillow or  rolled up towels firmly against it. Once you are able to get out of bed, walk around indoors and cough well. You may stop using the incentive spirometer when instructed by your caregiver.  RISKS AND COMPLICATIONS Take your time so you do not get dizzy or light-headed. If you are in pain, you may need to take or ask for pain medication before doing incentive spirometry. It is harder to take a deep breath if you are having pain. AFTER USE Rest and breathe slowly and easily. It can be helpful to keep track of a log of your progress. Your caregiver can provide you with a simple table to help with this. If you are using the spirometer at home, follow these instructions: [place] IF:  You are having difficultly using the spirometer. You have trouble using the spirometer as often as instructed. Your pain medication is not giving enough relief while using the spirometer. You develop fever of 100.5 F (38.1 C) or higher. SEEK IMMEDIATE MEDICAL CARE IF:  You cough up bloody sputum that had not been present before. You develop fever of 102 F (38.9 C) or greater. You develop worsening pain at or near the incision site. MAKE SURE YOU:  Understand these instructions. Will watch your condition. Will get help right away if you are not doing well or get worse. Document Released: 07/13/2006 Document Revised: 05/25/2011 Document Reviewed: 09/13/2006 [facility] Patient Information 2014 [place], [place].   ________________________________________________________________________

## 2021-02-17 NOTE — Progress Notes (Addendum)
PCP - Annye Asa ,MD Cardiologist - no  PPM/ICD -  Device Orders -  Rep Notified -   Chest x-ray - 01-31-21 EKG -  Stress Test -  ECHO -  Cardiac Cath -   Sleep Study -  CPAP -   Fasting Blood Sugar -  Checks Blood Sugar _____ times a day  Blood Thinner Instructions: Aspirin Instructions:  ERAS Protcol - PRE-SURGERY Ensure or G2-   COVID TEST- 02-21-21 COVID vaccine -  Activity--Able to walk a flihgt of stairs without SOB Anesthesia review:  HTN, BP elevated at preop pt. Advised to monitor ast home and if remains elevated reach out to PCP . Jessica Ward PA-C aware at preop  Patient denies shortness of breath, fever, cough and chest pain at PAT appointment   All instructions explained to the patient, with a verbal understanding of the material. Patient agrees to go over the instructions while at home for a better understanding. Patient also instructed to self quarantine after being tested for COVID-19. The opportunity to ask questions was provided.

## 2021-02-18 ENCOUNTER — Encounter (HOSPITAL_COMMUNITY): Payer: Self-pay

## 2021-02-18 ENCOUNTER — Encounter (HOSPITAL_COMMUNITY)
Admission: RE | Admit: 2021-02-18 | Discharge: 2021-02-18 | Disposition: A | Discharge status: Home / Self Care | Payer: 59 | Source: Ambulatory Visit | Attending: Urology | Admitting: Urology

## 2021-02-18 ENCOUNTER — Other Ambulatory Visit: Payer: Self-pay

## 2021-02-18 VITALS — BP 155/96 | HR 89 | Temp 98.3°F | Resp 16 | Ht 67.0 in | Wt 220.0 lb

## 2021-02-18 DIAGNOSIS — I10 Essential (primary) hypertension: Secondary | ICD-10-CM | POA: Diagnosis not present

## 2021-02-18 DIAGNOSIS — Z01818 Encounter for other preprocedural examination: Secondary | ICD-10-CM | POA: Diagnosis present

## 2021-02-18 HISTORY — DX: Personal history of urinary calculi: Z87.442

## 2021-02-18 HISTORY — DX: Sleep apnea, unspecified: G47.30

## 2021-02-18 LAB — CBC
HCT: 43.8 % (ref 39.0–52.0)
Hemoglobin: 15.4 g/dL (ref 13.0–17.0)
MCH: 28.7 pg (ref 26.0–34.0)
MCHC: 35.2 g/dL (ref 30.0–36.0)
MCV: 81.7 fL (ref 80.0–100.0)
Platelets: 269 10*3/uL (ref 150–400)
RBC: 5.36 MIL/uL (ref 4.22–5.81)
RDW: 12.4 % (ref 11.5–15.5)
WBC: 6.7 10*3/uL (ref 4.0–10.5)
nRBC: 0 % (ref 0.0–0.2)

## 2021-02-18 LAB — BASIC METABOLIC PANEL
Anion gap: 8 (ref 5–15)
BUN: 14 mg/dL (ref 6–20)
CO2: 26 mmol/L (ref 22–32)
Calcium: 9.5 mg/dL (ref 8.9–10.3)
Chloride: 103 mmol/L (ref 98–111)
Creatinine, Ser: 0.97 mg/dL (ref 0.61–1.24)
GFR, Estimated: >60 mL/min (ref >60)
Glucose, Bld: 136 mg/dL — ABNORMAL HIGH (ref 70–99)
Potassium: 3.5 mmol/L (ref 3.5–5.1)
Sodium: 137 mmol/L (ref 135–145)

## 2021-02-21 ENCOUNTER — Other Ambulatory Visit: Payer: Self-pay | Admitting: Urology

## 2021-02-21 ENCOUNTER — Other Ambulatory Visit: Payer: Self-pay | Admitting: Family Medicine

## 2021-02-21 LAB — SARS CORONAVIRUS 2 (TAT 6-24 HRS): SARS Coronavirus 2: NEGATIVE

## 2021-02-24 ENCOUNTER — Encounter (HOSPITAL_COMMUNITY): Payer: Self-pay | Admitting: Urology

## 2021-02-24 ENCOUNTER — Institutional Professional Consult (permissible substitution): Payer: 59 | Admitting: Pulmonary Disease

## 2021-02-24 NOTE — Anesthesia Preprocedure Evaluation (Addendum)
Anesthesia Evaluation  Patient identified by MRN, date of birth, ID band Patient awake    Reviewed: Allergy & Precautions, NPO status , Patient's Chart, lab work & pertinent test results  Airway Mallampati: I  TM Distance: >3 FB Neck ROM: Full    Dental  (+) Dental Advisory Given, Chipped,    Pulmonary sleep apnea and Continuous Positive Airway Pressure Ventilation , former smoker,    Pulmonary exam normal breath sounds clear to auscultation       Cardiovascular hypertension, Pt. on medications Normal cardiovascular exam Rhythm:Regular Rate:Normal     Neuro/Psych  Headaches, negative psych ROS   GI/Hepatic negative GI ROS, Neg liver ROS,   Endo/Other  negative endocrine ROS  Renal/GU negative Renal ROS  negative genitourinary   Musculoskeletal negative musculoskeletal ROS (+)   Abdominal   Peds  Hematology negative hematology ROS (+)   Anesthesia Other Findings Right renal mass  Reproductive/Obstetrics                            Anesthesia Physical Anesthesia Plan  ASA: 2  Anesthesia Plan: General   Post-op Pain Management: Tylenol PO (pre-op) and Ketamine IV   Induction: Intravenous  PONV Risk Score and Plan: 2 and Midazolam, Dexamethasone and Ondansetron  Airway Management Planned: Oral ETT  Additional Equipment:   Intra-op Plan:   Post-operative Plan: Extubation in OR  Informed Consent: I have reviewed the patients History and Physical, chart, labs and discussed the procedure including the risks, benefits and alternatives for the proposed anesthesia with the patient or authorized representative who has indicated his/her understanding and acceptance.     Dental advisory given  Plan Discussed with: CRNA  Anesthesia Plan Comments: (2 IVs)        Anesthesia Quick Evaluation

## 2021-02-25 ENCOUNTER — Inpatient Hospital Stay (HOSPITAL_COMMUNITY): Payer: 59 | Admitting: Physician Assistant

## 2021-02-25 ENCOUNTER — Inpatient Hospital Stay (HOSPITAL_COMMUNITY)
Admission: RE | Admit: 2021-02-25 | Discharge: 2021-02-26 | DRG: 658 | Disposition: A | Discharge status: Home / Self Care | Payer: 59 | Source: Ambulatory Visit | Attending: Urology | Admitting: Urology

## 2021-02-25 ENCOUNTER — Encounter (HOSPITAL_COMMUNITY)
Admission: RE | Disposition: A | Discharge status: Home / Self Care | Payer: Self-pay | Source: Ambulatory Visit | Attending: Urology

## 2021-02-25 ENCOUNTER — Encounter (HOSPITAL_COMMUNITY): Payer: Self-pay | Admitting: Urology

## 2021-02-25 DIAGNOSIS — Z8249 Family history of ischemic heart disease and other diseases of the circulatory system: Secondary | ICD-10-CM

## 2021-02-25 DIAGNOSIS — Z20822 Contact with and (suspected) exposure to covid-19: Secondary | ICD-10-CM | POA: Diagnosis present

## 2021-02-25 DIAGNOSIS — G4733 Obstructive sleep apnea (adult) (pediatric): Secondary | ICD-10-CM | POA: Diagnosis present

## 2021-02-25 DIAGNOSIS — Z87442 Personal history of urinary calculi: Secondary | ICD-10-CM | POA: Diagnosis not present

## 2021-02-25 DIAGNOSIS — E785 Hyperlipidemia, unspecified: Secondary | ICD-10-CM | POA: Diagnosis present

## 2021-02-25 DIAGNOSIS — Z87891 Personal history of nicotine dependence: Secondary | ICD-10-CM | POA: Diagnosis not present

## 2021-02-25 DIAGNOSIS — E669 Obesity, unspecified: Secondary | ICD-10-CM | POA: Diagnosis present

## 2021-02-25 DIAGNOSIS — I10 Essential (primary) hypertension: Secondary | ICD-10-CM | POA: Diagnosis present

## 2021-02-25 DIAGNOSIS — Z6834 Body mass index (BMI) 34.0-34.9, adult: Secondary | ICD-10-CM

## 2021-02-25 DIAGNOSIS — N2889 Other specified disorders of kidney and ureter: Secondary | ICD-10-CM | POA: Diagnosis present

## 2021-02-25 DIAGNOSIS — C641 Malignant neoplasm of right kidney, except renal pelvis: Secondary | ICD-10-CM | POA: Diagnosis present

## 2021-02-25 HISTORY — PX: ROBOT ASSISTED LAPAROSCOPIC NEPHRECTOMY: SHX5140

## 2021-02-25 LAB — BASIC METABOLIC PANEL
Anion gap: 7 (ref 5–15)
BUN: 13 mg/dL (ref 6–20)
CO2: 26 mmol/L (ref 22–32)
Calcium: 8.4 mg/dL — ABNORMAL LOW (ref 8.9–10.3)
Chloride: 104 mmol/L (ref 98–111)
Creatinine, Ser: 1.5 mg/dL — ABNORMAL HIGH (ref 0.61–1.24)
GFR, Estimated: 57 mL/min — ABNORMAL LOW (ref >60)
Glucose, Bld: 153 mg/dL — ABNORMAL HIGH (ref 70–99)
Potassium: 3.8 mmol/L (ref 3.5–5.1)
Sodium: 137 mmol/L (ref 135–145)

## 2021-02-25 LAB — TYPE AND SCREEN
ABO/RH(D): O POS
Antibody Screen: NEGATIVE

## 2021-02-25 LAB — ABO/RH: ABO/RH(D): O POS

## 2021-02-25 LAB — HEMOGLOBIN AND HEMATOCRIT, BLOOD
HCT: 39 % (ref 39.0–52.0)
Hemoglobin: 13.5 g/dL (ref 13.0–17.0)

## 2021-02-25 SURGERY — NEPHRECTOMY, RADICAL, ROBOT-ASSISTED, LAPAROSCOPIC, ADULT
Anesthesia: General | Site: Renal | Laterality: Right

## 2021-02-25 MED ORDER — BUPIVACAINE LIPOSOME 1.3 % IJ SUSP
INTRAMUSCULAR | Status: AC
  Filled 2021-02-25: qty 20

## 2021-02-25 MED ORDER — CHLORHEXIDINE GLUCONATE CLOTH 2 % EX PADS
6.0000 | MEDICATED_PAD | Freq: Every day | CUTANEOUS | Status: DC
  Administered 2021-02-25 – 2021-02-26 (×2): 6 via TOPICAL

## 2021-02-25 MED ORDER — FENTANYL CITRATE PF 50 MCG/ML IJ SOSY
PREFILLED_SYRINGE | INTRAMUSCULAR | Status: AC
  Filled 2021-02-25: qty 1

## 2021-02-25 MED ORDER — DOCUSATE SODIUM 100 MG PO CAPS
100.0000 mg | ORAL_CAPSULE | Freq: Two times a day (BID) | ORAL | Status: DC
  Administered 2021-02-25 – 2021-02-26 (×2): 100 mg via ORAL
  Filled 2021-02-25 (×2): qty 1

## 2021-02-25 MED ORDER — PHENYLEPHRINE 40 MCG/ML (10ML) SYRINGE FOR IV PUSH (FOR BLOOD PRESSURE SUPPORT)
PREFILLED_SYRINGE | INTRAVENOUS | Status: AC
  Filled 2021-02-25: qty 10

## 2021-02-25 MED ORDER — PHENYLEPHRINE 40 MCG/ML (10ML) SYRINGE FOR IV PUSH (FOR BLOOD PRESSURE SUPPORT)
PREFILLED_SYRINGE | INTRAVENOUS | Status: DC | PRN
  Administered 2021-02-25: 80 ug via INTRAVENOUS

## 2021-02-25 MED ORDER — ROCURONIUM BROMIDE 100 MG/10ML IV SOLN
INTRAVENOUS | Status: DC | PRN
  Administered 2021-02-25: 100 mg via INTRAVENOUS

## 2021-02-25 MED ORDER — FENTANYL CITRATE (PF) 100 MCG/2ML IJ SOLN
INTRAMUSCULAR | Status: AC
  Filled 2021-02-25: qty 2

## 2021-02-25 MED ORDER — ACETAMINOPHEN 500 MG PO TABS
1000.0000 mg | ORAL_TABLET | Freq: Once | ORAL | Status: AC
  Administered 2021-02-25: 1000 mg via ORAL
  Filled 2021-02-25: qty 2

## 2021-02-25 MED ORDER — LIDOCAINE HCL (PF) 2 % IJ SOLN
INTRAMUSCULAR | Status: AC
  Filled 2021-02-25: qty 5

## 2021-02-25 MED ORDER — SENNOSIDES-DOCUSATE SODIUM 8.6-50 MG PO TABS
2.0000 | ORAL_TABLET | Freq: Every day | ORAL | Status: DC
  Administered 2021-02-25: 2 via ORAL
  Filled 2021-02-25: qty 2

## 2021-02-25 MED ORDER — ROCURONIUM BROMIDE 10 MG/ML (PF) SYRINGE
PREFILLED_SYRINGE | INTRAVENOUS | Status: AC
  Filled 2021-02-25: qty 10

## 2021-02-25 MED ORDER — ORAL CARE MOUTH RINSE: 15.0000 mL | Freq: Once | OROMUCOSAL | Status: AC

## 2021-02-25 MED ORDER — FENTANYL CITRATE PF 50 MCG/ML IJ SOSY
PREFILLED_SYRINGE | INTRAMUSCULAR | Status: AC
  Filled 2021-02-25: qty 2

## 2021-02-25 MED ORDER — KETAMINE HCL-SODIUM CHLORIDE 100-0.9 MG/10ML-% IV SOSY
PREFILLED_SYRINGE | INTRAVENOUS | Status: AC
  Filled 2021-02-25: qty 10

## 2021-02-25 MED ORDER — MIDAZOLAM HCL 2 MG/2ML IJ SOLN
INTRAMUSCULAR | Status: AC
  Filled 2021-02-25: qty 2

## 2021-02-25 MED ORDER — MORPHINE SULFATE (PF) 2 MG/ML IV SOLN
2.0000 mg | INTRAVENOUS | Status: DC | PRN
  Administered 2021-02-25 – 2021-02-26 (×5): 2 mg via INTRAVENOUS
  Filled 2021-02-25 (×6): qty 1

## 2021-02-25 MED ORDER — LIDOCAINE HCL (CARDIAC) PF 100 MG/5ML IV SOSY
PREFILLED_SYRINGE | INTRAVENOUS | Status: DC | PRN
  Administered 2021-02-25: 80 mg via INTRAVENOUS

## 2021-02-25 MED ORDER — CEFAZOLIN SODIUM-DEXTROSE 1-4 GM/50ML-% IV SOLN
1.0000 g | Freq: Three times a day (TID) | INTRAVENOUS | Status: AC
  Administered 2021-02-25 (×2): 1 g via INTRAVENOUS
  Filled 2021-02-25 (×3): qty 50

## 2021-02-25 MED ORDER — PROPOFOL 10 MG/ML IV BOLUS
INTRAVENOUS | Status: AC
  Filled 2021-02-25: qty 20

## 2021-02-25 MED ORDER — SODIUM CHLORIDE (PF) 0.9 % IJ SOLN
INTRAMUSCULAR | Status: DC | PRN
  Administered 2021-02-25: 20 mL

## 2021-02-25 MED ORDER — DEXAMETHASONE SODIUM PHOSPHATE 10 MG/ML IJ SOLN
INTRAMUSCULAR | Status: AC
  Filled 2021-02-25: qty 1

## 2021-02-25 MED ORDER — DEXAMETHASONE SODIUM PHOSPHATE 10 MG/ML IJ SOLN
INTRAMUSCULAR | Status: DC | PRN
  Administered 2021-02-25: 10 mg via INTRAVENOUS

## 2021-02-25 MED ORDER — ONDANSETRON HCL 4 MG/2ML IJ SOLN: 4.0000 mg | INTRAMUSCULAR | Status: DC | PRN

## 2021-02-25 MED ORDER — STERILE WATER FOR IRRIGATION IR SOLN
Status: DC | PRN
  Administered 2021-02-25: 1000 mL

## 2021-02-25 MED ORDER — CHLORHEXIDINE GLUCONATE 0.12 % MT SOLN
15.0000 mL | Freq: Once | OROMUCOSAL | Status: AC
  Administered 2021-02-25: 15 mL via OROMUCOSAL

## 2021-02-25 MED ORDER — KETAMINE HCL 10 MG/ML IJ SOLN
INTRAMUSCULAR | Status: DC | PRN
  Administered 2021-02-25: 10 mg via INTRAVENOUS
  Administered 2021-02-25: 20 mg via INTRAVENOUS
  Administered 2021-02-25 (×2): 10 mg via INTRAVENOUS

## 2021-02-25 MED ORDER — PROPOFOL 10 MG/ML IV BOLUS
INTRAVENOUS | Status: DC | PRN
  Administered 2021-02-25: 150 mg via INTRAVENOUS

## 2021-02-25 MED ORDER — ACETAMINOPHEN 10 MG/ML IV SOLN
1000.0000 mg | Freq: Four times a day (QID) | INTRAVENOUS | Status: AC
  Administered 2021-02-25 – 2021-02-26 (×4): 1000 mg via INTRAVENOUS
  Filled 2021-02-25 (×4): qty 100

## 2021-02-25 MED ORDER — CLONIDINE HCL 0.1 MG PO TABS
0.1000 mg | ORAL_TABLET | Freq: Two times a day (BID) | ORAL | Status: DC
  Administered 2021-02-26: 11:00:00 0.1 mg via ORAL
  Filled 2021-02-25: qty 1

## 2021-02-25 MED ORDER — FENTANYL CITRATE (PF) 100 MCG/2ML IJ SOLN
INTRAMUSCULAR | Status: DC | PRN
  Administered 2021-02-25: 50 ug via INTRAVENOUS
  Administered 2021-02-25: 100 ug via INTRAVENOUS

## 2021-02-25 MED ORDER — MIDAZOLAM HCL 5 MG/5ML IJ SOLN
INTRAMUSCULAR | Status: DC | PRN
  Administered 2021-02-25: 2 mg via INTRAVENOUS

## 2021-02-25 MED ORDER — BUPIVACAINE LIPOSOME 1.3 % IJ SUSP
INTRAMUSCULAR | Status: DC | PRN
  Administered 2021-02-25: 20 mL

## 2021-02-25 MED ORDER — ATORVASTATIN CALCIUM 20 MG PO TABS
20.0000 mg | ORAL_TABLET | Freq: Every day | ORAL | Status: DC
  Administered 2021-02-25 – 2021-02-26 (×2): 20 mg via ORAL
  Filled 2021-02-25 (×2): qty 1

## 2021-02-25 MED ORDER — FENTANYL CITRATE PF 50 MCG/ML IJ SOSY
25.0000 ug | PREFILLED_SYRINGE | INTRAMUSCULAR | Status: DC | PRN
  Administered 2021-02-25 (×3): 50 ug via INTRAVENOUS

## 2021-02-25 MED ORDER — OXYCODONE HCL 5 MG PO TABS
ORAL_TABLET | ORAL | Status: AC
  Administered 2021-02-25: 5 mg via ORAL
  Filled 2021-02-25: qty 1

## 2021-02-25 MED ORDER — SODIUM CHLORIDE (PF) 0.9 % IJ SOLN
INTRAMUSCULAR | Status: AC
  Filled 2021-02-25: qty 20

## 2021-02-25 MED ORDER — SODIUM CHLORIDE 0.45 % IV SOLN: INTRAVENOUS | Status: DC

## 2021-02-25 MED ORDER — OXYCODONE HCL 5 MG PO TABS
5.0000 mg | ORAL_TABLET | ORAL | Status: DC | PRN
  Administered 2021-02-25 – 2021-02-26 (×3): 5 mg via ORAL
  Filled 2021-02-25 (×3): qty 1

## 2021-02-25 MED ORDER — ACETAMINOPHEN 10 MG/ML IV SOLN
INTRAVENOUS | Status: AC
  Filled 2021-02-25: qty 100

## 2021-02-25 MED ORDER — CEFAZOLIN SODIUM-DEXTROSE 2-4 GM/100ML-% IV SOLN
2.0000 g | Freq: Once | INTRAVENOUS | Status: AC
  Administered 2021-02-25: 2 g via INTRAVENOUS
  Filled 2021-02-25: qty 100

## 2021-02-25 MED ORDER — ONDANSETRON HCL 4 MG/2ML IJ SOLN
INTRAMUSCULAR | Status: DC | PRN
  Administered 2021-02-25: 4 mg via INTRAVENOUS

## 2021-02-25 MED ORDER — LACTATED RINGERS IR SOLN
Status: DC | PRN
  Administered 2021-02-25: 1000 mL

## 2021-02-25 MED ORDER — LACTATED RINGERS IV SOLN: INTRAVENOUS | Status: DC

## 2021-02-25 SURGICAL SUPPLY — 57 items
ADH SKN CLS APL DERMABOND .7 (GAUZE/BANDAGES/DRESSINGS) ×1
APL PRP STRL LF DISP 70% ISPRP (MISCELLANEOUS) ×1
BAG COUNTER SPONGE SURGICOUNT (BAG) IMPLANT
BAG LAPAROSCOPIC 12 15 PORT 16 (BASKET) ×1 IMPLANT
BAG RETRIEVAL 12/15 (BASKET) ×2
BAG RETRIEVAL 12/15MM (BASKET) ×1
BAG SPNG CNTER NS LX DISP (BAG)
BAG SURGICOUNT SPONGE COUNTING (BAG)
CHLORAPREP W/TINT 26 (MISCELLANEOUS) ×3 IMPLANT
CLIP LIGATING HEM O LOK PURPLE (MISCELLANEOUS) ×5 IMPLANT
CLIP LIGATING HEMO LOK XL GOLD (MISCELLANEOUS) ×3 IMPLANT
CLIP LIGATING HEMO O LOK GREEN (MISCELLANEOUS) IMPLANT
COVER SURGICAL LIGHT HANDLE (MISCELLANEOUS) ×3 IMPLANT
COVER TIP SHEARS 8 DVNC (MISCELLANEOUS) ×1 IMPLANT
COVER TIP SHEARS 8MM DA VINCI (MISCELLANEOUS) ×2
CUTTER ECHEON FLEX ENDO 45 340 (ENDOMECHANICALS) ×2 IMPLANT
DECANTER SPIKE VIAL GLASS SM (MISCELLANEOUS) ×3 IMPLANT
DERMABOND ADVANCED (GAUZE/BANDAGES/DRESSINGS) ×2
DERMABOND ADVANCED .7 DNX12 (GAUZE/BANDAGES/DRESSINGS) ×1 IMPLANT
DRAPE ARM DVNC X/XI (DISPOSABLE) ×4 IMPLANT
DRAPE COLUMN DVNC XI (DISPOSABLE) ×1 IMPLANT
DRAPE DA VINCI XI ARM (DISPOSABLE) ×8
DRAPE DA VINCI XI COLUMN (DISPOSABLE) ×2
DRAPE INCISE IOBAN 66X45 STRL (DRAPES) ×3 IMPLANT
DRAPE SHEET LG 3/4 BI-LAMINATE (DRAPES) ×3 IMPLANT
ELECT PENCIL ROCKER SW 15FT (MISCELLANEOUS) ×3 IMPLANT
ELECT REM PT RETURN 15FT ADLT (MISCELLANEOUS) ×3 IMPLANT
GLOVE SURG ENC MOIS LTX SZ6.5 (GLOVE) ×3 IMPLANT
GLOVE SURG ENC TEXT LTX SZ7.5 (GLOVE) ×6 IMPLANT
GOWN STRL REUS W/TWL LRG LVL3 (GOWN DISPOSABLE) ×3 IMPLANT
GOWN STRL REUS W/TWL XL LVL3 (GOWN DISPOSABLE) ×6 IMPLANT
HOLDER FOLEY CATH W/STRAP (MISCELLANEOUS) ×3 IMPLANT
IRRIG SUCT STRYKERFLOW 2 WTIP (MISCELLANEOUS) ×3
IRRIGATION SUCT STRKRFLW 2 WTP (MISCELLANEOUS) ×1 IMPLANT
KIT BASIN OR (CUSTOM PROCEDURE TRAY) ×3 IMPLANT
KIT TURNOVER KIT A (KITS) IMPLANT
MARKER SKIN DUAL TIP RULER LAB (MISCELLANEOUS) ×3 IMPLANT
NDL INSUFFLATION 14GA 120MM (NEEDLE) ×1 IMPLANT
NEEDLE INSUFFLATION 14GA 120MM (NEEDLE) ×3 IMPLANT
PROTECTOR NERVE ULNAR (MISCELLANEOUS) ×6 IMPLANT
RELOAD STAPLE 45 2.6 WHT THIN (STAPLE) IMPLANT
SEAL CANN UNIV 5-8 DVNC XI (MISCELLANEOUS) ×3 IMPLANT
SEAL XI 5MM-8MM UNIVERSAL (MISCELLANEOUS) ×6
SET TUBE SMOKE EVAC HIGH FLOW (TUBING) ×3 IMPLANT
SOLUTION ELECTROLUBE (MISCELLANEOUS) ×3 IMPLANT
STAPLE RELOAD 45 WHT (STAPLE) ×3 IMPLANT
STAPLE RELOAD 45MM WHITE (STAPLE) ×9
SUT MNCRL AB 4-0 PS2 18 (SUTURE) ×6 IMPLANT
SUT PDS AB 0 CT1 36 (SUTURE) ×6 IMPLANT
SUT VICRYL 0 UR6 27IN ABS (SUTURE) ×3 IMPLANT
TOWEL OR 17X26 10 PK STRL BLUE (TOWEL DISPOSABLE) ×3 IMPLANT
TOWEL OR NON WOVEN STRL DISP B (DISPOSABLE) ×3 IMPLANT
TRAY FOLEY MTR SLVR 16FR STAT (SET/KITS/TRAYS/PACK) ×3 IMPLANT
TRAY LAPAROSCOPIC (CUSTOM PROCEDURE TRAY) ×3 IMPLANT
TROCAR BLADELESS OPT 5 100 (ENDOMECHANICALS) ×2 IMPLANT
TROCAR XCEL 12X100 BLDLESS (ENDOMECHANICALS) ×3 IMPLANT
WATER STERILE IRR 1000ML POUR (IV SOLUTION) ×3 IMPLANT

## 2021-02-25 NOTE — Anesthesia Procedure Notes (Signed)
Procedure Name: Intubation Date/Time: 02/25/2021 7:28 AM Performed by: [place] Ardella Chhim C, CRNA Pre-anesthesia Checklist: Patient identified, Emergency Drugs available, Suction available and Patient being monitored Patient Re-evaluated:Patient Re-evaluated prior to induction Oxygen Delivery Method: Circle system utilized Preoxygenation: Pre-oxygenation with 100% oxygen Induction Type: IV induction Ventilation: Mask ventilation without difficulty Laryngoscope Size: Mac and 4 Grade View: Grade I Tube type: Oral Tube size: 7.5 mm Number of attempts: 1 Airway Equipment and Method: Stylet and Oral airway Placement Confirmation: ETT inserted through vocal cords under direct vision, positive ETCO2 and breath sounds checked- equal and bilateral Secured at: 21 cm Tube secured with: Tape Dental Injury: Teeth and Oropharynx as per pre-operative assessment

## 2021-02-25 NOTE — Op Note (Signed)
Operative Note  Preoperative diagnosis:  1.  4.1 cm right renal mass  Postoperative diagnosis: 1.  4.1 cm right renal mass  Procedure(s): 1.  Robot-assisted laparoscopic right radical nephrectomy (adrenal sparing)  Surgeon: Ellison Hughs, MD  Assistants:  Davis Gourd, MD PGY 4  Anesthesia:  General  Complications:  None  EBL: 100 mL  Specimens: 1.  Right kidney  Drains/Catheters: 1.  Foley catheter  Intraoperative findings:   No gross evidence of tumor extension beyond the kidney Hemostatic right renal hilum  Indication:  Dakota Reid is a 50 y.o. male with a solid and enhancing right renal mass with features concerning for renal cell carcinoma.  He has been consented for the above procedures, voices understanding and wishes to proceed.  Description of procedure:  After informed consent was signed, the patient was taken back to the operating room and properly anesthetized.  The patient was then placed in the left lateral decubitus position with all pressure points padded.  The abdomen was then prepped and draped in the usual sterile fashion.  A time-out was then performed.     An 8 mm incision was then made lateral to the right rectus muscle at the level of the right 12th rib.  A Veress needle was then used to access the abdominal cavity.  A saline drop test showed no signs of obstruction and aspiration of the Veress needle revealed no blood or sucus.  The abdominal cavity was then insufflated to 15 mmHg.  An 8 mm robotic trocar was then atraumatically inserted into the abdominal cavity.  The robotic camera was then inserted through the port and inspection of the abdominal cavity revealed no evidence of adjacent organ or vessel injury. We then placed three additional 8 mm robotic ports and a 12 mm assistant port in such as fashion as to triangulate the right renal hilum.  The robot was then docked into postion.   Using a combination of blunt and cold scissors dissection,  the hepatic attachments were released from the abdominal sidewall.  The white line of [place]t along the ascending colon was then incised, allowing [place] to reflect the colon medially and expose the anterior surface of the right kidney.  The duodenum was then Kocherized medially, which abruptly led [place] to the identification of the inferior vena cava.    Once the colon was adequately mobilized, we moved to the lower pole and identified the gonadal vein and ureter.  The gonadal vein was then left running parallel to the vena cava and the right ureter was reflected anteriorly.  Using cautious cautery, the overlying perihilar attachments were then released.  This yielded visualization of the renal hilum, which included multiple right renal veins and multiple right renal arteries.  The perilymphatic tissue surrounding the right renal arteries were carefully released.    A 45 mm powered endovascular stapler was then used to ligate the right renal arteries and veins, en bloc.  The right hilar stump was hemostatic following staple ligation.  Hemoclips were then applied to the proximal aspects of the right ureter, which was then sharply incised.  The remaining perinephric attachments were then incised using electrocautery. Reinspection of the right retroperitoneal space revealed excellent hemostasis. Once the right kidney was fully mobile, it was placed in an Endo Catch bag and left in the abdominal cavity.   The 12 mm midline assistant port was then closed using the [employer] technique with a 0 Vicryl suture.  A right lower quadrant Gibson incision was  then made in the right kidney was removed within the Endo Catch bag.  The fascia of the external and internal oblique were then closed with a running 0 PDS suture.  The skin incisions were then closed using 4-0 Monocryl.  Dermabond was applied to all skin incisions.  Plan:  Monitor on the floor overnight

## 2021-02-25 NOTE — Transfer of Care (Signed)
Immediate Anesthesia Transfer of Care Note  Patient: Dakota Reid  Procedure(s) Performed: XI ROBOTIC ASSISTED LAPAROSCOPIC NEPHRECTOMY (Right: Renal)  Patient Location: PACU  Anesthesia Type:General  Level of Consciousness: awake and drowsy  Airway & Oxygen Therapy: Patient Spontanous Breathing and Patient connected to face mask oxygen  Post-op Assessment: Report given to RN and Post -op Vital signs reviewed and stable  Post vital signs: Reviewed and stable  Last Vitals:  Vitals Value Taken Time  BP 138/95 02/25/21 1033  Temp    Pulse 65 02/25/21 1035  Resp 14 02/25/21 1035  SpO2 100 % 02/25/21 1035  Vitals shown include unvalidated device data.  Last Pain:  Vitals:   02/25/21 0545  TempSrc:   PainSc: 0-No pain         Complications: No notable events documented.

## 2021-02-25 NOTE — Anesthesia Postprocedure Evaluation (Signed)
Anesthesia Post Note  Patient: Dakota Reid  Procedure(s) Performed: XI ROBOTIC ASSISTED LAPAROSCOPIC NEPHRECTOMY (Right: Renal)     Patient location during evaluation: PACU Anesthesia Type: General Level of consciousness: awake and alert Pain management: pain level controlled Vital Signs Assessment: post-procedure vital signs reviewed and stable Respiratory status: spontaneous breathing, nonlabored ventilation, respiratory function stable and patient connected to nasal cannula oxygen Cardiovascular status: blood pressure returned to baseline and stable Postop Assessment: no apparent nausea or vomiting Anesthetic complications: no   No notable events documented.  Last Vitals:  Vitals:   02/25/21 1200 02/25/21 1215  BP: 127/82   Pulse: 62 65  Resp: (!) 7 12  Temp:    SpO2: 100% 100%    Last Pain:  Vitals:   02/25/21 1215  TempSrc:   PainSc: 6                  Deem Marmol L Navaya Wiatrek

## 2021-02-25 NOTE — H&P (Signed)
Urology Preoperative H&P   Chief Complaint: Right renal mass  History of Present Illness: Dakota Reid is a 50 y.o. male with a solid enhancing right renal mass with features concerning for renal cell carcinoma on MRI from 01/21/2021. The mass was initially discovered on CT stone study from 01/20/2021 during an acute episode of right-sided flank pain. He was found to have a punctate right UVJ stone at that time. Follow-up MRI showed no evidence of residual hydronephrosis or signs of metastatic disease.   -Anatomy: 4.1 cm right lower pole renal mass extensively involving the collecting system and complex arterial anatomy. No evidence of vein thrombus on MRI  -Previously smoked 1 pack/day for 13 years (quit 20 years ago)  -No personal/family history of GU malignancies  -No prior history of nephrolithiasis  -No prior abdominal surgeries  -His right-sided flank pain has since resolved and he denies nausea/vomiting, fever/chills, dysuria or hematuria.     Past Medical History:  Diagnosis Date   History of kidney stones    Hyperlipidemia    Hypertension    Sleep apnea    CPAP    Past Surgical History:  Procedure Laterality Date   NO PAST SURGERIES     WISDOM TOOTH EXTRACTION      Allergies: No Known Allergies  Family History  Problem Relation Age of Onset   Hypertension Father    Kidney Stones Father    Benign prostatic hyperplasia Father    Diabetes Mother    Cancer Mother        cholangiocarcinoma   Prostate cancer Paternal Grandfather    Cancer Maternal Grandmother        colon   Colon cancer Maternal Grandmother    Lung cancer Maternal Grandmother     Social History:  reports that he quit smoking about 13 years ago. His smoking use included cigarettes. He has never used smokeless tobacco. He reports that he does not currently use alcohol. He reports that he does not use drugs.  ROS: A complete review of systems was performed.  All systems are negative except for  pertinent findings as noted.  Physical Exam:  Vital signs in last 24 hours: Temp:  [98 F (36.7 C)] 98 F (36.7 C) (12/13 0542) Pulse Rate:  [70] 70 (12/13 0542) Resp:  [17] 17 (12/13 0542) BP: (141)/(95) 141/95 (12/13 0542) SpO2:  [99 %] 99 % (12/13 0542) Weight:  [99.8 kg] 99.8 kg (12/13 0545) Constitutional:  Alert and oriented, No acute distress Cardiovascular: Regular rate and rhythm, No JVD Respiratory: Normal respiratory effort, Lungs clear bilaterally GI: Abdomen is soft, nontender, nondistended, no abdominal masses GU: No CVA tenderness Lymphatic: No lymphadenopathy Neurologic: Grossly intact, no focal deficits Psychiatric: Normal mood and affect  Laboratory Data:  No results for input(s): WBC, HGB, HCT, PLT in the last 72 hours.  No results for input(s): NA, K, CL, GLUCOSE, BUN, CALCIUM, CREATININE in the last 72 hours.  Invalid input(s): CO3   No results found for this or any previous visit (from the past 24 hour(s)). Recent Results (from the past 240 hour(s))  SARS Coronavirus 2 (TAT 6-24 hrs)     Status: None   Collection Time: 02/21/21 12:00 AM  Result Value Ref Range Status   SARS Coronavirus 2 RESULT: NEGATIVE  Final    Comment: RESULT: NEGATIVESARS-CoV-2 INTERPRETATION:A NEGATIVE  test result means that SARS-CoV-2 RNA was not present in the specimen above the limit of detection of this test. This does not preclude a possible SARS-CoV-2  infection and should not be used as the  sole basis for patient management decisions. Negative results must be combined with clinical observations, patient history, and epidemiological information. Optimum specimen types and timing for peak viral levels during infections caused by SARS-CoV-2  have not been determined. Collection of multiple specimens or types of specimens may be necessary to detect virus. Improper specimen collection and handling, sequence variability under primers/probes, or organism present below the limit of  detection may  lead to false negative results. Positive and negative predictive values of testing are highly dependent on prevalence. False negative test results are more likely when prevalence of disease is high.The expected result is NEGATIVE.Fact S heet for  Healthcare Providers: LocalChronicle.no Sheet for Patients: SalonLookup.es Reference Range - Negative     Renal Function: Recent Labs    02/18/21 1451  CREATININE 0.97   Estimated Creatinine Clearance: 104.9 mL/min (by C-G formula based on SCr of 0.97 mg/dL).  Radiologic Imaging: CLINICAL DATA: Further evaluation of right renal lesion.   EXAM:  MRI ABDOMEN WITHOUT AND WITH CONTRAST   TECHNIQUE:  Multiplanar multisequence MR imaging of the abdomen was performed  both before and after the administration of intravenous contrast.   CONTRAST: 38mL GADAVIST GADOBUTROL 1 MMOL/ML IV SOLN   COMPARISON: CT January 20, 2021.   FINDINGS:  Lower chest: No acute abnormality.   Hepatobiliary: Diffuse hepatic steatosis. No suspicious hepatic  lesion. Gallbladder is unremarkable. No biliary ductal dilation.   Pancreas: No mass, inflammatory changes, or other parenchymal  abnormality identified.   Spleen: Within normal limits in size and appearance.   Adrenals/Urinary Tract: Bilateral adrenal glands are unremarkable.   Heterogeneously enhancing 4.1 x 3.4 cm RIGHT lower pole renal lesion  on image 72/17, the lesion is partially exophytic but also extends  within the lower pole renal sinus.   Subcentimeter right upper pole renal cyst. No hydronephrosis. Left  kidney is unremarkable.   Stomach/Bowel: Visualized portions within the abdomen are  unremarkable.   Vascular/Lymphatic: No abdominal aortic aneurysm. Right renal vein  is patent without suspicious intraluminal filling defect. No  pathologically enlarged abdominal lymph nodes.   Other: No abdominal ascites.    Musculoskeletal: No suspicious bone lesions identified.   IMPRESSION:  1. Heterogeneously enhancing 4.1 x 3.4 cm RIGHT lower pole renal  lesion compatible with renal cell carcinoma. Urology consult  suggested.  2. No evidence of tumor in vein or metastatic disease in the  abdomen.  3. Diffuse hepatic steatosis.    Electronically Signed  By: Dahlia Bailiff M.D.  On: 01/21/2021 18:32  I independently reviewed the above imaging studies.  Assessment and Plan Dakota Reid is a 50 y.o. male with a 4.1 cm solid and enhancing right renal mass with features concerning for RCC  -I reviewed imaging results and films with the patient personally. We discussed that the mass in question has features concerning for malignancy. I explained the natural history of presumed renal cell carcinoma. I reviewed the AUA guidelines for evaluation and treatment of renal masses. The options of active surveillance, in situ tumor ablation, partial and radical nephrectomy was discussed. The risks of robot assisted laparoscopic RIGHT radical nephrectomy were discussed in detail including but not limited to: negative pathology, open conversion, infection of the skin/abdominal cavity, VTE, MI/CVA, lymphatic leak, injury to adjacent solid/hollow viscus organs, bleeding requiring a blood transfusion, catastrophic bleeding, hernia formation and other imponderables. The patient voices understanding and wishes to proceed.   Ellison Hughs, MD 02/25/2021, 6:56 AM  [facility] Pager: [phone]

## 2021-02-25 NOTE — Progress Notes (Signed)
Received patient into 1421. Vital signs taken and within normal limits. Patient alert and oriented x 4. Call bell in reach. Assessment to follow.

## 2021-02-26 ENCOUNTER — Other Ambulatory Visit (HOSPITAL_COMMUNITY): Payer: Self-pay

## 2021-02-26 ENCOUNTER — Encounter (HOSPITAL_COMMUNITY): Payer: Self-pay | Admitting: Urology

## 2021-02-26 ENCOUNTER — Other Ambulatory Visit: Payer: Self-pay

## 2021-02-26 LAB — BASIC METABOLIC PANEL
Anion gap: 10 (ref 5–15)
Anion gap: 9 (ref 5–15)
BUN: 14 mg/dL (ref 6–20)
BUN: 15 mg/dL (ref 6–20)
CO2: 25 mmol/L (ref 22–32)
CO2: 26 mmol/L (ref 22–32)
Calcium: 8.9 mg/dL (ref 8.9–10.3)
Calcium: 8.8 mg/dL — ABNORMAL LOW (ref 8.9–10.3)
Chloride: 104 mmol/L (ref 98–111)
Chloride: 102 mmol/L (ref 98–111)
Creatinine, Ser: 1.81 mg/dL — ABNORMAL HIGH (ref 0.61–1.24)
Creatinine, Ser: 1.74 mg/dL — ABNORMAL HIGH (ref 0.61–1.24)
GFR, Estimated: 46 mL/min — ABNORMAL LOW (ref >60)
GFR, Estimated: 48 mL/min — ABNORMAL LOW (ref >60)
Glucose, Bld: 134 mg/dL — ABNORMAL HIGH (ref 70–99)
Glucose, Bld: 124 mg/dL — ABNORMAL HIGH (ref 70–99)
Potassium: 3.9 mmol/L (ref 3.5–5.1)
Potassium: 4.5 mmol/L (ref 3.5–5.1)
Sodium: 139 mmol/L (ref 135–145)
Sodium: 137 mmol/L (ref 135–145)

## 2021-02-26 LAB — HEMOGLOBIN AND HEMATOCRIT, BLOOD
HCT: 38.9 % — ABNORMAL LOW (ref 39.0–52.0)
Hemoglobin: 13.5 g/dL (ref 13.0–17.0)

## 2021-02-26 MED ORDER — DOCUSATE SODIUM 100 MG PO CAPS
100.0000 mg | ORAL_CAPSULE | Freq: Two times a day (BID) | ORAL | 0 refills | Status: DC | PRN
  Filled 2021-02-26: qty 30, 15d supply, fill #0

## 2021-02-26 MED ORDER — HYDROCODONE-ACETAMINOPHEN 5-325 MG PO TABS
1.0000 | ORAL_TABLET | ORAL | 0 refills | Status: DC | PRN
  Filled 2021-02-26: qty 20, 3d supply, fill #0

## 2021-02-26 MED ORDER — ONDANSETRON HCL 4 MG PO TABS
4.0000 mg | ORAL_TABLET | Freq: Every day | ORAL | 1 refills | Status: DC | PRN
  Filled 2021-02-26: qty 30, 30d supply, fill #0

## 2021-02-26 NOTE — Progress Notes (Signed)
Received report from [place], Therapist, sports. Assuming care of patient at this time.

## 2021-02-26 NOTE — TOC Initial Note (Signed)
Transition of Care [facility]) - Initial/Assessment Note    Patient Details  Name: Dakota Reid MRN: [id number] Date of Birth: 23-Jun-1970  Transition of Care [facility]) CM/SW Contact:    Leeroy Cha, RN Phone Number: 02/26/2021, 7:45 AM  Clinical Narrative:                  Transition of Care [facility]) Screening Note   Patient Details  Name: Dakota Reid Date of Birth: 1970-12-09   Transition of Care [facility]) CM/SW Contact:    Leeroy Cha, RN Phone Number: 02/26/2021, 7:45 AM    Transition of Care Department [facility]) has reviewed patient and no TOC needs have been identified at this time. We will continue to monitor patient advancement through interdisciplinary progression rounds. If new patient transition needs arise, please place a TOC consult.  TOC PLAN OF CARE: to return to home with spouse and self care. POD1 excision of renal mass.  Expected Discharge Plan: Home/Self Care Barriers to Discharge: Continued Medical Work up   Patient Goals and CMS Choice Patient states their goals for this hospitalization and ongoing recovery are:: to go home CMS Medicare.gov Compare Post Acute Care list provided to:: Patient    Expected Discharge Plan and Services Expected Discharge Plan: Home/Self Care   Discharge Planning Services: CM Consult   Living arrangements for the past 2 months: Single Family Home                                      Prior Living Arrangements/Services Living arrangements for the past 2 months: Single Family Home Lives with:: Spouse Patient language and need for interpreter reviewed:: Yes Do you feel safe going back to the place where you live?: Yes            Criminal Activity/Legal Involvement Pertinent to Current Situation/Hospitalization: No - Comment as needed  Activities of Daily Living Home Assistive Devices/Equipment: None ADL Screening (condition at time of admission) Patient's cognitive ability adequate to safely complete daily  activities?: Yes Is the patient deaf or have difficulty hearing?: No Does the patient have difficulty seeing, even when wearing glasses/contacts?: No Does the patient have difficulty concentrating, remembering, or making decisions?: No Patient able to express need for assistance with ADLs?: Yes Does the patient have difficulty dressing or bathing?: No Independently performs ADLs?: Yes (appropriate for developmental age) Does the patient have difficulty walking or climbing stairs?: No Weakness of Legs: None Weakness of Arms/Hands: None  Permission Sought/Granted                  Emotional Assessment Appearance:: Appears stated age Attitude/Demeanor/Rapport: Engaged Affect (typically observed): Calm Orientation: : Oriented to Place, Oriented to Self, Oriented to  Time, Oriented to Situation Alcohol / Substance Use: Not Applicable Psych Involvement: No (comment)  Admission diagnosis:  Renal mass [N28.89] Patient Active Problem List   Diagnosis Date Noted   Renal mass 02/25/2021   Obstructive sleep apnea treated with continuous positive airway pressure (CPAP) 05/23/2018   Obesity (BMI 30.0-34.9) 09/24/2016   Chronic motor or vocal tic disorder 07/29/2016   OSA (obstructive sleep apnea) 07/29/2016   Low testosterone 05/06/2015   Erectile dysfunction 05/06/2015   Accessory skin tags 11/02/2014   Decreased libido 07/20/2013   Involuntary muscle contractions 03/03/2012   Tension headache 03/03/2012   General medical examination 09/15/2010   MOLE 08/26/2009   Hyperlipidemia 08/26/2009   PCP:  Midge Minium, MD Pharmacy:   [id number] [place], [place] [address] Phone: [phone] Fax: [phone]  [facility] ([place], [place] [facility] [address] [place] [address] Phone: [phone] Fax: [phone]  [facility] ([facility] ) -  [place], [place] [address] Phone: [phone] Fax: [place] [address] [place] [place] [address] Phone: [phone] Fax: [phone]     Social Determinants of Health (SDOH) Interventions    Readmission Risk Interventions No flowsheet data found.

## 2021-02-26 NOTE — Discharge Summary (Signed)
Date of admission: 02/25/2021  Date of discharge: 02/26/2021  Admission diagnosis: Renal mass   Discharge diagnosis: Renal mass   Secondary diagnoses:  Patient Active Problem List   Diagnosis Date Noted   Renal mass 02/25/2021   Obstructive sleep apnea treated with continuous positive airway pressure (CPAP) 05/23/2018   Obesity (BMI 30.0-34.9) 09/24/2016   Chronic motor or vocal tic disorder 07/29/2016   OSA (obstructive sleep apnea) 07/29/2016   Low testosterone 05/06/2015   Erectile dysfunction 05/06/2015   Accessory skin tags 11/02/2014   Decreased libido 07/20/2013   Involuntary muscle contractions 03/03/2012   Tension headache 03/03/2012   General medical examination 09/15/2010   MOLE 08/26/2009   Hyperlipidemia 08/26/2009    Procedures performed: Procedure(s): XI ROBOTIC ASSISTED LAPAROSCOPIC NEPHRECTOMY  History and Physical: For full details, please see admission history and physical. Briefly, Dakota Reid is a 50 y.o. year old patient with a right renal mass who presents for robotic assisted right radical nephrectomy.   Hospital Course: Patient tolerated the procedure well.  He was then transferred to the floor after an uneventful PACU stay.  His hospital course was uncomplicated. His POD1 am creatinine was 1.8 and this remained stable on recheck at 1.7. His foley was removed and he passed a TOV. He was passing flatus regularly and tolerating a regular diet prior to discharge. On POD#1 he had met discharge criteria: was eating a regular diet, was up and ambulating independently,  pain was well controlled, was voiding without a catheter, and was ready to for discharge.   Laboratory values:  Recent Labs    02/25/21 1102 02/26/21 0441  HGB 13.5 13.5  HCT 39.0 38.9*   Recent Labs    02/25/21 1102 02/26/21 0441 02/26/21 1233  NA 137 139 137  K 3.8 3.9 4.5  CL 104 104 102  CO2 $Re'26 25 26  'CGA$ GLUCOSE 153* 134* 124*  BUN $Re'13 14 15  'mYI$ CREATININE 1.50* 1.81* 1.74*   CALCIUM 8.4* 8.9 8.8*   No results for input(s): LABPT, INR in the last 72 hours. No results for input(s): LABURIN in the last 72 hours. Results for orders placed or performed in visit on 02/21/21  SARS Coronavirus 2 (TAT 6-24 hrs)     Status: None   Collection Time: 02/21/21 12:00 AM  Result Value Ref Range Status   SARS Coronavirus 2 RESULT: NEGATIVE  Final    Comment: RESULT: NEGATIVESARS-CoV-2 INTERPRETATION:A NEGATIVE  test result means that SARS-CoV-2 RNA was not present in the specimen above the limit of detection of this test. This does not preclude a possible SARS-CoV-2 infection and should not be used as the  sole basis for patient management decisions. Negative results must be combined with clinical observations, patient history, and epidemiological information. Optimum specimen types and timing for peak viral levels during infections caused by SARS-CoV-2  have not been determined. Collection of multiple specimens or types of specimens may be necessary to detect virus. Improper specimen collection and handling, sequence variability under primers/probes, or organism present below the limit of detection may  lead to false negative results. Positive and negative predictive values of testing are highly dependent on prevalence. False negative test results are more likely when prevalence of disease is high.The expected result is NEGATIVE.Fact S heet for  Healthcare Providers: LocalChronicle.no Sheet for Patients: SalonLookup.es Reference Range - Negative     Disposition: Home  Discharge instruction: The patient was instructed to be ambulatory but told to refrain from heavy lifting, strenuous activity, or driving.  Discharge medications:  Allergies as of 02/26/2021   No Known Allergies      Medication List     STOP taking these medications    methocarbamol 500 MG tablet Commonly known as: Robaxin       TAKE  these medications    atorvastatin 20 MG tablet Commonly known as: LIPITOR TAKE 1 TABLET BY MOUTH  DAILY AT 6 PM   cloNIDine 0.1 MG tablet Commonly known as: CATAPRES Take 1 tablet (0.1 mg total) by mouth 2 (two) times daily.   docusate sodium 100 MG capsule Commonly known as: Colace Take 1 capsule (100 mg total) by mouth 2 (two) times daily as needed for mild constipation.   fenofibrate 160 MG tablet TAKE 1 TABLET BY MOUTH  DAILY   fluticasone 50 MCG/ACT nasal spray Commonly known as: FLONASE Place 2 sprays into both nostrils daily.   HYDROcodone-acetaminophen 5-325 MG tablet Commonly known as: Norco Take 1 tablet by mouth every 4 (four) hours as needed for moderate pain.   ibuprofen 200 MG tablet Commonly known as: ADVIL Take 600 mg by mouth every 6 (six) hours as needed for headache or moderate pain.   multivitamin Tabs tablet Take 1 tablet by mouth daily.   ondansetron 4 MG tablet Commonly known as: Zofran Take 1 tablet (4 mg total) by mouth daily as needed for nausea or vomiting.

## 2021-02-27 LAB — SURGICAL PATHOLOGY

## 2021-02-28 ENCOUNTER — Emergency Department (HOSPITAL_COMMUNITY)
Admission: EM | Admit: 2021-02-28 | Discharge: 2021-03-01 | Disposition: A | Discharge status: Home / Self Care | Payer: 59 | Attending: Emergency Medicine | Admitting: Emergency Medicine

## 2021-02-28 ENCOUNTER — Other Ambulatory Visit: Payer: Self-pay

## 2021-02-28 ENCOUNTER — Encounter (HOSPITAL_COMMUNITY): Payer: Self-pay | Admitting: Emergency Medicine

## 2021-02-28 DIAGNOSIS — R000 Tachycardia, unspecified: Secondary | ICD-10-CM | POA: Diagnosis not present

## 2021-02-28 DIAGNOSIS — K5900 Constipation, unspecified: Secondary | ICD-10-CM | POA: Insufficient documentation

## 2021-02-28 DIAGNOSIS — R911 Solitary pulmonary nodule: Secondary | ICD-10-CM | POA: Insufficient documentation

## 2021-02-28 DIAGNOSIS — I10 Essential (primary) hypertension: Secondary | ICD-10-CM | POA: Insufficient documentation

## 2021-02-28 DIAGNOSIS — Z79899 Other long term (current) drug therapy: Secondary | ICD-10-CM | POA: Insufficient documentation

## 2021-02-28 DIAGNOSIS — R0602 Shortness of breath: Secondary | ICD-10-CM | POA: Insufficient documentation

## 2021-02-28 DIAGNOSIS — M549 Dorsalgia, unspecified: Secondary | ICD-10-CM | POA: Insufficient documentation

## 2021-02-28 DIAGNOSIS — L03311 Cellulitis of abdominal wall: Secondary | ICD-10-CM | POA: Diagnosis not present

## 2021-02-28 DIAGNOSIS — R509 Fever, unspecified: Secondary | ICD-10-CM

## 2021-02-28 DIAGNOSIS — Z87891 Personal history of nicotine dependence: Secondary | ICD-10-CM | POA: Insufficient documentation

## 2021-02-28 DIAGNOSIS — Z20822 Contact with and (suspected) exposure to covid-19: Secondary | ICD-10-CM | POA: Diagnosis not present

## 2021-02-28 NOTE — ED Triage Notes (Signed)
Patient arrives s/p kidney removal. Patient has been having fever despite tylenol use. Patient has swelling to his abdomen and around his incision site with redness and streaking. Patient states his surgeon told him to come in and be seen due to his symptoms.

## 2021-03-01 ENCOUNTER — Emergency Department (HOSPITAL_COMMUNITY): Payer: 59

## 2021-03-01 ENCOUNTER — Encounter (HOSPITAL_COMMUNITY): Payer: Self-pay

## 2021-03-01 LAB — CBC WITH DIFFERENTIAL/PLATELET
Abs Immature Granulocytes: 0.05 10*3/uL (ref 0.00–0.07)
Basophils Absolute: 0 10*3/uL (ref 0.0–0.1)
Basophils Relative: 0 %
Eosinophils Absolute: 0.3 10*3/uL (ref 0.0–0.5)
Eosinophils Relative: 2 %
HCT: 45.1 % (ref 39.0–52.0)
Hemoglobin: 15.8 g/dL (ref 13.0–17.0)
Immature Granulocytes: 0 %
Lymphocytes Relative: 18 %
Lymphs Abs: 2 10*3/uL (ref 0.7–4.0)
MCH: 28.4 pg (ref 26.0–34.0)
MCHC: 35 g/dL (ref 30.0–36.0)
MCV: 81.1 fL (ref 80.0–100.0)
Monocytes Absolute: 1.1 10*3/uL — ABNORMAL HIGH (ref 0.1–1.0)
Monocytes Relative: 10 %
Neutro Abs: 7.8 10*3/uL — ABNORMAL HIGH (ref 1.7–7.7)
Neutrophils Relative %: 70 %
Platelets: 266 10*3/uL (ref 150–400)
RBC: 5.56 MIL/uL (ref 4.22–5.81)
RDW: 12.2 % (ref 11.5–15.5)
WBC: 11.2 10*3/uL — ABNORMAL HIGH (ref 4.0–10.5)
nRBC: 0 % (ref 0.0–0.2)

## 2021-03-01 LAB — COMPREHENSIVE METABOLIC PANEL
ALT: 26 U/L (ref 0–44)
AST: 26 U/L (ref 15–41)
Albumin: 4.6 g/dL (ref 3.5–5.0)
Alkaline Phosphatase: 50 U/L (ref 38–126)
Anion gap: 12 (ref 5–15)
BUN: 18 mg/dL (ref 6–20)
CO2: 23 mmol/L (ref 22–32)
Calcium: 9.6 mg/dL (ref 8.9–10.3)
Chloride: 99 mmol/L (ref 98–111)
Creatinine, Ser: 1.67 mg/dL — ABNORMAL HIGH (ref 0.61–1.24)
GFR, Estimated: 50 mL/min — ABNORMAL LOW (ref >60)
Glucose, Bld: 113 mg/dL — ABNORMAL HIGH (ref 70–99)
Potassium: 3.7 mmol/L (ref 3.5–5.1)
Sodium: 134 mmol/L — ABNORMAL LOW (ref 135–145)
Total Bilirubin: 1.6 mg/dL — ABNORMAL HIGH (ref 0.3–1.2)
Total Protein: 8.6 g/dL — ABNORMAL HIGH (ref 6.5–8.1)

## 2021-03-01 LAB — RESP PANEL BY RT-PCR (FLU A&B, COVID) ARPGX2
Influenza A by PCR: NEGATIVE
Influenza B by PCR: NEGATIVE
SARS Coronavirus 2 by RT PCR: NEGATIVE

## 2021-03-01 LAB — I-STAT CHEM 8, ED
BUN: 17 mg/dL (ref 6–20)
Calcium, Ion: 1.17 mmol/L (ref 1.15–1.40)
Chloride: 100 mmol/L (ref 98–111)
Creatinine, Ser: 1.7 mg/dL — ABNORMAL HIGH (ref 0.61–1.24)
Glucose, Bld: 113 mg/dL — ABNORMAL HIGH (ref 70–99)
HCT: 46 % (ref 39.0–52.0)
Hemoglobin: 15.6 g/dL (ref 13.0–17.0)
Potassium: 3.9 mmol/L (ref 3.5–5.1)
Sodium: 136 mmol/L (ref 135–145)
TCO2: 23 mmol/L (ref 22–32)

## 2021-03-01 LAB — URINALYSIS, ROUTINE W REFLEX MICROSCOPIC
Bilirubin Urine: NEGATIVE
Glucose, UA: NEGATIVE mg/dL
Hgb urine dipstick: NEGATIVE
Ketones, ur: 5 mg/dL — AB
Leukocytes,Ua: NEGATIVE
Nitrite: NEGATIVE
Protein, ur: NEGATIVE mg/dL
Specific Gravity, Urine: 1.012 (ref 1.005–1.030)
pH: 7 (ref 5.0–8.0)

## 2021-03-01 LAB — LACTIC ACID, PLASMA: Lactic Acid, Venous: 0.9 mmol/L (ref 0.5–1.9)

## 2021-03-01 LAB — PROTIME-INR
INR: 1.1 (ref 0.8–1.2)
Prothrombin Time: 14.6 seconds (ref 11.4–15.2)

## 2021-03-01 MED ORDER — LACTATED RINGERS IV BOLUS
1000.0000 mL | Freq: Once | INTRAVENOUS | Status: AC
  Administered 2021-03-01: 1000 mL via INTRAVENOUS

## 2021-03-01 MED ORDER — IOHEXOL 350 MG/ML SOLN
80.0000 mL | Freq: Once | INTRAVENOUS | Status: AC | PRN
  Administered 2021-03-01: 80 mL via INTRAVENOUS

## 2021-03-01 MED ORDER — ACETAMINOPHEN 325 MG PO TABS
650.0000 mg | ORAL_TABLET | Freq: Once | ORAL | Status: AC
  Administered 2021-03-01: 650 mg via ORAL
  Filled 2021-03-01: qty 2

## 2021-03-01 MED ORDER — SODIUM CHLORIDE 0.9 % IV SOLN
1.0000 g | Freq: Once | INTRAVENOUS | Status: AC
  Administered 2021-03-01: 1 g via INTRAVENOUS
  Filled 2021-03-01: qty 10

## 2021-03-01 MED ORDER — SULFAMETHOXAZOLE-TRIMETHOPRIM 800-160 MG PO TABS: 1.0000 | ORAL_TABLET | Freq: Two times a day (BID) | ORAL | 0 refills | Status: AC

## 2021-03-01 MED ORDER — IOHEXOL 9 MG/ML PO SOLN
ORAL | Status: AC
  Filled 2021-03-01: qty 1000

## 2021-03-01 NOTE — Discharge Instructions (Addendum)
You had a CT scan performed in the emergency department today.  The CT scan did show a nodule on your lung.  This will need to be followed up by your family doctor for repeat imaging in the next year.  Please follow-up with urology regarding your recent surgery.  Take the antibiotics as prescribed.  Get rechecked if you develop new concerning symptoms.

## 2021-03-01 NOTE — Consult Note (Signed)
Urology Consult   Physician requesting consult: Quintella Reichert, MD  Reason for consult: Superficial surgical site infection  History of Present Illness: Dakota Reid is a 50 y.o. with a history of pT1bN0M0 clear-cell renal cell carcinoma, grade 2 s/p right radical nephrectomy 02/25/2021.  Patient was discharged home on postoperative day 1 with no postoperative complications.  Presented to ED postoperative day 4 with complaints of low-grade fever at 99 since discharge and had a fever of 101.8 on 02/28/2021. He noticed erythema around all of his incisions on POD2 that decreased in intensity over the past 24 hours and is still lingering around the lateral aspect of his Gibson incision. He denies any drainage from the surgical site. He has been passing flatus and has had 4 bowel movements. He denies chest pain but noticed vague mild subjective shortness of breath since surgery. He states that his right shoulder is sore and both of his wrists are sore.   Past Medical History:  Diagnosis Date   History of kidney stones    Hyperlipidemia    Hypertension    Sleep apnea    CPAP    Past Surgical History:  Procedure Laterality Date   NO PAST SURGERIES     ROBOT ASSISTED LAPAROSCOPIC NEPHRECTOMY Right 02/25/2021   Procedure: XI ROBOTIC ASSISTED LAPAROSCOPIC NEPHRECTOMY;  Surgeon: Ceasar Mons, MD;  Location: [facility];  Service: Urology;  Laterality: Right;   WISDOM TOOTH EXTRACTION      Medications:  Home meds:  No current facility-administered medications on file prior to encounter.   Current Outpatient Medications on File Prior to Encounter  Medication Sig Dispense Refill   atorvastatin (LIPITOR) 20 MG tablet TAKE 1 TABLET BY MOUTH  DAILY AT 6 PM 90 tablet 3   cloNIDine (CATAPRES) 0.1 MG tablet Take 1 tablet (0.1 mg total) by mouth 2 (two) times daily. 180 tablet 1   docusate sodium (COLACE) 100 MG capsule Take 1 capsule (100 mg total) by mouth 2 (two) times daily as needed for  mild constipation. 30 capsule 0   fenofibrate 160 MG tablet TAKE 1 TABLET BY MOUTH  DAILY 90 tablet 3   fluticasone (FLONASE) 50 MCG/ACT nasal spray Place 2 sprays into both nostrils daily.  4   HYDROcodone-acetaminophen (NORCO) 5-325 MG tablet Take 1 tablet by mouth every 4 (four) hours as needed for moderate pain. 20 tablet 0   ibuprofen (ADVIL) 200 MG tablet Take 600 mg by mouth every 6 (six) hours as needed for headache or moderate pain.     multivitamin (ONE-A-DAY MEN'S) TABS Take 1 tablet by mouth daily.     ondansetron (ZOFRAN) 4 MG tablet Take 1 tablet (4 mg total) by mouth daily as needed for nausea or vomiting. 30 tablet 1     Scheduled Meds:  acetaminophen  650 mg Oral Once   iohexol       Continuous Infusions:  cefTRIAXone (ROCEPHIN)  IV 1 g (03/01/21 0303)   PRN Meds:.  Allergies: No Known Allergies  Family History  Problem Relation Age of Onset   Hypertension Father    Kidney Stones Father    Benign prostatic hyperplasia Father    Diabetes Mother    Cancer Mother        cholangiocarcinoma   Prostate cancer Paternal Grandfather    Cancer Maternal Grandmother        colon   Colon cancer Maternal Grandmother    Lung cancer Maternal Grandmother     Social History:  reports that  he quit smoking about 13 years ago. His smoking use included cigarettes. He has never used smokeless tobacco. He reports that he does not currently use alcohol. He reports that he does not use drugs.  ROS: A complete review of systems was performed.  All systems are negative except for pertinent findings as noted.  Physical Exam:  Vital signs in last 24 hours: Temp:  [98.1 F (36.7 C)-98.6 F (37 C)] 98.1 F (36.7 C) (12/17 0300) Pulse Rate:  [81-110] 83 (12/17 0300) Resp:  [14-18] 15 (12/17 0300) BP: (143-185)/(109-118) 162/111 (12/17 0200) SpO2:  [95 %-99 %] 99 % (12/17 0300) Weight:  [99.8 kg] 99.8 kg (12/16 2342) Constitutional:  Alert and oriented, No acute  distress Cardiovascular: Regular rate and rhythm Respiratory: Normal respiratory effort, Lungs clear bilaterally GI: Abdomen is soft, appropriately tender over incision, nondistended, no abdominal masses, no peritoneal findings; all incisions clean, dry and intact. The Gibson incision has some surrounding erythema laterally and warmth present. No leakage from incision. No induration, fluctuance, necrosis or crepitus. Representative photo is in the media tab Neurologic: Grossly intact, no focal deficits. Bilateral upper extremities 5/5 strength with sensation intact Psychiatric: Normal mood and affect  Laboratory Data:  Recent Labs    02/26/21 0441 03/01/21 0015 03/01/21 0023  WBC  --  11.2*  --   HGB 13.5 15.8 15.6  HCT 38.9* 45.1 46.0  PLT  --  266  --     Recent Labs    02/26/21 0441 02/26/21 1233 03/01/21 0015 03/01/21 0023  NA 139 137 134* 136  K 3.9 4.5 3.7 3.9  CL 104 102 99 100  GLUCOSE 134* 124* 113* 113*  BUN 14 15 18 17   CALCIUM 8.9 8.8* 9.6  --   CREATININE 1.81* 1.74* 1.67* 1.70*     Results for orders placed or performed during the hospital encounter of 02/28/21 (from the past 24 hour(s))  Resp Panel by RT-PCR (Flu A&B, Covid) Nasopharyngeal Swab     Status: None   Collection Time: 03/01/21 12:14 AM   Specimen: Nasopharyngeal Swab; Nasopharyngeal(NP) swabs in vial transport medium  Result Value Ref Range   SARS Coronavirus 2 by RT PCR NEGATIVE NEGATIVE   Influenza A by PCR NEGATIVE NEGATIVE   Influenza B by PCR NEGATIVE NEGATIVE  Comprehensive metabolic panel     Status: Abnormal   Collection Time: 03/01/21 12:15 AM  Result Value Ref Range   Sodium 134 (L) 135 - 145 mmol/L   Potassium 3.7 3.5 - 5.1 mmol/L   Chloride 99 98 - 111 mmol/L   CO2 23 22 - 32 mmol/L   Glucose, Bld 113 (H) 70 - 99 mg/dL   BUN 18 6 - 20 mg/dL   Creatinine, Ser 1.67 (H) 0.61 - 1.24 mg/dL   Calcium 9.6 8.9 - 10.3 mg/dL   Total Protein 8.6 (H) 6.5 - 8.1 g/dL   Albumin 4.6 3.5 -  5.0 g/dL   AST 26 15 - 41 U/L   ALT 26 0 - 44 U/L   Alkaline Phosphatase 50 38 - 126 U/L   Total Bilirubin 1.6 (H) 0.3 - 1.2 mg/dL   GFR, Estimated 50 (L) >60 mL/min   Anion gap 12 5 - 15  Lactic acid, plasma     Status: None   Collection Time: 03/01/21 12:15 AM  Result Value Ref Range   Lactic Acid, Venous 0.9 0.5 - 1.9 mmol/L  CBC with Differential     Status: Abnormal   Collection Time:  03/01/21 12:15 AM  Result Value Ref Range   WBC 11.2 (H) 4.0 - 10.5 K/uL   RBC 5.56 4.22 - 5.81 MIL/uL   Hemoglobin 15.8 13.0 - 17.0 g/dL   HCT 45.1 39.0 - 52.0 %   MCV 81.1 80.0 - 100.0 fL   MCH 28.4 26.0 - 34.0 pg   MCHC 35.0 30.0 - 36.0 g/dL   RDW 12.2 11.5 - 15.5 %   Platelets 266 150 - 400 K/uL   nRBC 0.0 0.0 - 0.2 %   Neutrophils Relative % 70 %   Neutro Abs 7.8 (H) 1.7 - 7.7 K/uL   Lymphocytes Relative 18 %   Lymphs Abs 2.0 0.7 - 4.0 K/uL   Monocytes Relative 10 %   Monocytes Absolute 1.1 (H) 0.1 - 1.0 K/uL   Eosinophils Relative 2 %   Eosinophils Absolute 0.3 0.0 - 0.5 K/uL   Basophils Relative 0 %   Basophils Absolute 0.0 0.0 - 0.1 K/uL   Immature Granulocytes 0 %   Abs Immature Granulocytes 0.05 0.00 - 0.07 K/uL  Protime-INR     Status: None   Collection Time: 03/01/21 12:15 AM  Result Value Ref Range   Prothrombin Time 14.6 11.4 - 15.2 seconds   INR 1.1 0.8 - 1.2  I-stat chem 8, ED     Status: Abnormal   Collection Time: 03/01/21 12:23 AM  Result Value Ref Range   Sodium 136 135 - 145 mmol/L   Potassium 3.9 3.5 - 5.1 mmol/L   Chloride 100 98 - 111 mmol/L   BUN 17 6 - 20 mg/dL   Creatinine, Ser 1.70 (H) 0.61 - 1.24 mg/dL   Glucose, Bld 113 (H) 70 - 99 mg/dL   Calcium, Ion 1.17 1.15 - 1.40 mmol/L   TCO2 23 22 - 32 mmol/L   Hemoglobin 15.6 13.0 - 17.0 g/dL   HCT 46.0 39.0 - 52.0 %   Recent Results (from the past 240 hour(s))  SARS Coronavirus 2 (TAT 6-24 hrs)     Status: None   Collection Time: 02/21/21 12:00 AM  Result Value Ref Range Status   SARS Coronavirus 2  RESULT: NEGATIVE  Final    Comment: RESULT: NEGATIVESARS-CoV-2 INTERPRETATION:A NEGATIVE  test result means that SARS-CoV-2 RNA was not present in the specimen above the limit of detection of this test. This does not preclude a possible SARS-CoV-2 infection and should not be used as the  sole basis for patient management decisions. Negative results must be combined with clinical observations, patient history, and epidemiological information. Optimum specimen types and timing for peak viral levels during infections caused by SARS-CoV-2  have not been determined. Collection of multiple specimens or types of specimens may be necessary to detect virus. Improper specimen collection and handling, sequence variability under primers/probes, or organism present below the limit of detection may  lead to false negative results. Positive and negative predictive values of testing are highly dependent on prevalence. False negative test results are more likely when prevalence of disease is high.The expected result is NEGATIVE.Fact S heet for  Healthcare Providers: LocalChronicle.no Sheet for Patients: SalonLookup.es Reference Range - Negative   Resp Panel by RT-PCR (Flu A&B, Covid) Nasopharyngeal Swab     Status: None   Collection Time: 03/01/21 12:14 AM   Specimen: Nasopharyngeal Swab; Nasopharyngeal(NP) swabs in vial transport medium  Result Value Ref Range Status   SARS Coronavirus 2 by RT PCR NEGATIVE NEGATIVE Final    Comment: (NOTE) SARS-CoV-2 target nucleic acids are NOT  DETECTED.  The SARS-CoV-2 RNA is generally detectable in upper respiratory specimens during the acute phase of infection. The lowest concentration of SARS-CoV-2 viral copies this assay can detect is 138 copies/mL. A negative result does not preclude SARS-Cov-2 infection and should not be used as the sole basis for treatment or other patient management decisions. A negative  result may occur with  improper specimen collection/handling, submission of specimen other than nasopharyngeal swab, presence of viral mutation(s) within the areas targeted by this assay, and inadequate number of viral copies(<138 copies/mL). A negative result must be combined with clinical observations, patient history, and epidemiological information. The expected result is Negative.  Fact Sheet for Patients:  EntrepreneurPulse.com.au  Fact Sheet for Healthcare Providers:  IncredibleEmployment.be  This test is no t yet approved or cleared by the [place] FDA and  has been authorized for detection and/or diagnosis of SARS-CoV-2 by FDA under an Emergency Use Authorization (EUA). This EUA will remain  in effect (meaning this test can be used) for the duration of the COVID-19 declaration under Section 564(b)(1) of the Act, 21 U.S.C.section 360bbb-3(b)(1), unless the authorization is terminated  or revoked sooner.       Influenza A by PCR NEGATIVE NEGATIVE Final   Influenza B by PCR NEGATIVE NEGATIVE Final    Comment: (NOTE) The Xpert Xpress SARS-CoV-2/FLU/RSV plus assay is intended as an aid in the diagnosis of influenza from Nasopharyngeal swab specimens and should not be used as a sole basis for treatment. Nasal washings and aspirates are unacceptable for Xpert Xpress SARS-CoV-2/FLU/RSV testing.  Fact Sheet for Patients: EntrepreneurPulse.com.au  Fact Sheet for Healthcare Providers: IncredibleEmployment.be  This test is not yet approved or cleared by the [place] FDA and has been authorized for detection and/or diagnosis of SARS-CoV-2 by FDA under an Emergency Use Authorization (EUA). This EUA will remain in effect (meaning this test can be used) for the duration of the COVID-19 declaration under Section 564(b)(1) of the Act, 21 U.S.C. section 360bbb-3(b)(1), unless the authorization is  terminated or revoked.  Performed at [facility], [place] [address], [place], [place] [address]     Renal Function: Recent Labs    02/25/21 1102 02/26/21 0441 02/26/21 1233 03/01/21 0015 03/01/21 0023  CREATININE 1.50* 1.81* 1.74* 1.67* 1.70*   Estimated Creatinine Clearance: 59.8 mL/min (A) (by C-G formula based on SCr of 1.7 mg/dL (H)).  Radiologic Imaging: CT Abdomen Pelvis Wo Contrast  Result Date: 03/01/2021 CLINICAL DATA:  Right nephrectomy 4 days ago with postoperative fever and abdominal swelling. EXAM: CT ABDOMEN AND PELVIS WITHOUT CONTRAST TECHNIQUE: Multidetector CT imaging of the abdomen and pelvis was performed following the standard protocol without IV contrast. COMPARISON:  MRI abdomen without and with contrast 01/21/2021, CT abdomen and pelvis no contrast 01/20/2021. FINDINGS: Lower chest: No acute process. The cardiac size is normal. A 3 mm left lower lobe nodule is noted on axial image 9 of series 4. Given the patient's high risk status with known renal carcinoma, a 1 year follow-up chest CT is recommended. Hepatobiliary: 17 cm in length mildly steatotic liver. No focal abnormality without contrast. Pancreas: No focal abnormality without contrast or ductal dilatation. Spleen: Mildly prominent measuring 14.5 cm AP, with splenules in the splenic hilum. No focal abnormality without contrast. Adrenals/Urinary Tract: No adrenal mass bilaterally. No focal noncontrasted left renal abnormality. Interval right nephrectomy. There is a thin walled low-density fluid collection posteriorly in the right renal fossa measuring 6.3 x 2.7 x 5.5 cm and 9.2 Hounsfield  units below the expected density of hematoma and most likely representing a seroma. An abscess is possible but the wall would usually be thicker and the fluid would usually be more dense. There is mild stranding in the right renal fossa most likely postoperative in etiology and scattered air droplets above the  fluid but not within the fluid collection. There is no evidence of urinary stones or obstruction. No bladder thickening is seen. There is trace air in the anterior bladder. Stomach/Bowel: No dilatation or wall thickening including the appendix. Mild constipation. There are colonic diverticula without evidence of colitis or diverticulitis. Vascular/Lymphatic: Scattered trace aortic calcific plaques. No lymphadenopathy is seen. Reproductive: Normal prostate. Other: There is patchy soft tissue gas in the subcutaneous plane in the right abdominal wall, minimal scattered free intraperitoneal air on the right including a small amount in the low anterior right pelvis, and scattered air pockets in between the right abdominal wall muscular layers. There is no fluid in the pelvis. Right retroperitoneal induration is seen which probably due to the recent surgery. There are small umbilical and inguinal fat hernias. Musculoskeletal: No concerning regional bone lesion. IMPRESSION: 1. 6.3 x 2.7 by 5.5 cm thin walled low-density fluid collection in the right renal fossa, below the typical density of hematoma and probably representing a seroma or less likely abscess. 2. Trace free air in the right renal fossa, additional free intraperitoneal air scattered along the liver and right abdominal wall undersurface into the pelvis, likely postsurgical in etiology. Correlate clinically for perforated bowel. Follow-up as indicated. 3. Additional patchy soft tissue gas subcutaneously in the right abdominal wall and between the right abdominal wall muscular layers, could be normal postsurgical change or infectious process but no abscess is seen. 4. Minimal air in the bladder, could be due to recent instrumentation or infectious process. No bladder thickening. 5. Mildly prominent liver and spleen are mild liver steatosis. 6. Constipation and diverticulosis. 7. 3 mm left lower lobe nodule. One year follow-up chest CT recommended. Electronically  Signed   By: Telford Nab M.D.   On: 03/01/2021 02:46   DG Chest Port 1 View  Result Date: 03/01/2021 CLINICAL DATA:  Status post recent nephrectomy with subsequent fever. EXAM: PORTABLE CHEST 1 VIEW COMPARISON:  January 30, 2021 FINDINGS: The heart size and mediastinal contours are within normal limits. Both lungs are clear. The visualized skeletal structures are unremarkable. IMPRESSION: No active disease. Electronically Signed   By: Virgina Norfolk M.D.   On: 03/01/2021 00:18    I independently reviewed the above imaging studies.  Impression/Recommendation Superficial surgical site infection of the RLQ Gibson incision pT1bN0M0 clear-cell renal cell carcinoma, grade 2 s/p right radical nephrectomy 02/25/2021.  -I reviewed his CT scan obtained given fever POD4 s/p R radical nephrectomy with no evidence of deep SSI or abscess. He has expected fluid in his right renal fossa with no evidence of abscess. He does have trace intraperitoneal air expected after lap surgery. His abdomen is soft, non-distended without peritoneal signs and NO focal tenderness over port sites. I do NOT think that he has evidence of a bowel injury. He does have subcutaneous air throughout the right abdominal wall extending up to the right chest likely due to CO2 escaping from his lap surgery. I do NOT think that this represents a necrotizing infection given his clinical exam. Given no deep abscess at site of infection, I recommend treating conservatively as a SSI with 1g rocephin in the ED and a course of bactrim.  -  Return precautions discussed with patient and wife. They know to call me with any change in his exam. He reassuringly admits that the erythema has decreased over the past 24 hours. -I notified him of his path and excellent prognosis. -Appropriate for discharge home. I will notify Dr. Lovena Neighbours of his ED visit.  Matt R. Oluwadamilola Deliz MD 03/01/2021, 3:17 AM  [facility]  Pager: [phone]   CC:  Quintella Reichert,  MD

## 2021-03-01 NOTE — ED Provider Notes (Signed)
Dakota Reid   CSN: [id number] Arrival date & time: 02/28/21  2316     History Chief Complaint  Patient presents with   Post-op Problem   Fever    Dakota Reid is a 50 y.o. male.  The history is provided by the patient, the spouse and medical records.  Fever Dakota Reid is a 50 y.o. male who presents to the Emergency Department complaining of fever.  He presents to the emergency department accompanied by his spouse for evaluation of fever.  He is status post nephrectomy on Tuesday.  He was discharged from the hospital on Wednesday.  Just after discharge he developed a fever at home to 99.  He has been having a temperature of 99 since then.  This evening he developed a temperature to 101.8 with associated chills.  He has associated abdominal bloating.  He also has some redness to the surgical site.  The redness at the site has actually decreased over the last 24 hours.  He also reports associated shortness of breath that has worsened since hospital discharge.  He has associated mid back pain with breathing and burning when he breathes.  No vomiting.  He does have constipation.    Past Medical History:  Diagnosis Date   History of kidney stones    Hyperlipidemia    Hypertension    Sleep apnea    CPAP    Patient Active Problem List   Diagnosis Date Noted   Renal mass 02/25/2021   Obstructive sleep apnea treated with continuous positive airway pressure (CPAP) 05/23/2018   Obesity (BMI 30.0-34.9) 09/24/2016   Chronic motor or vocal tic disorder 07/29/2016   OSA (obstructive sleep apnea) 07/29/2016   Low testosterone 05/06/2015   Erectile dysfunction 05/06/2015   Accessory skin tags 11/02/2014   Decreased libido 07/20/2013   Involuntary muscle contractions 03/03/2012   Tension headache 03/03/2012   General medical examination 09/15/2010   MOLE 08/26/2009   Hyperlipidemia 08/26/2009    Past Surgical History:  Procedure  Laterality Date   NO PAST SURGERIES     ROBOT ASSISTED LAPAROSCOPIC NEPHRECTOMY Right 02/25/2021   Procedure: XI ROBOTIC ASSISTED LAPAROSCOPIC NEPHRECTOMY;  Surgeon: Ceasar Mons, MD;  Location: [facility];  Service: Urology;  Laterality: Right;   WISDOM TOOTH EXTRACTION         Family History  Problem Relation Age of Onset   Hypertension Father    Kidney Stones Father    Benign prostatic hyperplasia Father    Diabetes Mother    Cancer Mother        cholangiocarcinoma   Prostate cancer Paternal Grandfather    Cancer Maternal Grandmother        colon   Colon cancer Maternal Grandmother    Lung cancer Maternal Grandmother     Social History   Tobacco Use   Smoking status: Former    Types: Cigarettes    Quit date: 03/17/2007    Years since quitting: 13.9   Smokeless tobacco: Never  Vaping Use   Vaping Use: Never used  Substance Use Topics   Alcohol use: Not Currently    Comment: 2-3 beers per week   Drug use: No    Home Medications Prior to Admission medications   Medication Sig Start Date End Date Taking? Authorizing Provider  sulfamethoxazole-trimethoprim (BACTRIM DS) 800-160 MG tablet Take 1 tablet by mouth 2 (two) times daily for 7 days. 03/01/21 03/08/21 Yes Quintella Reichert, MD  atorvastatin (LIPITOR) 20 MG tablet  TAKE 1 TABLET BY MOUTH  DAILY AT 6 PM 02/24/21   Midge Minium, MD  cloNIDine (CATAPRES) 0.1 MG tablet Take 1 tablet (0.1 mg total) by mouth 2 (two) times daily. 12/11/20   Midge Minium, MD  docusate sodium (COLACE) 100 MG capsule Take 1 capsule (100 mg total) by mouth 2 (two) times daily as needed for mild constipation. 02/26/21 02/26/22  Ceasar Mons, MD  fenofibrate 160 MG tablet TAKE 1 TABLET BY MOUTH  DAILY 02/04/21   Midge Minium, MD  fluticasone [facility]) 50 MCG/ACT nasal spray Place 2 sprays into both nostrils daily. 03/30/14   [provider]  HYDROcodone-acetaminophen (NORCO) 5-325 MG tablet Take 1  tablet by mouth every 4 (four) hours as needed for moderate pain. 02/26/21   Ceasar Mons, MD  ibuprofen (ADVIL) 200 MG tablet Take 600 mg by mouth every 6 (six) hours as needed for headache or moderate pain.    [provider]  multivitamin (ONE-A-DAY MEN'S) TABS Take 1 tablet by mouth daily.    [provider]  ondansetron (ZOFRAN) 4 MG tablet Take 1 tablet (4 mg total) by mouth daily as needed for nausea or vomiting. 02/26/21 02/26/22  Ceasar Mons, MD    Allergies    Patient has no known allergies.  Review of Systems   Review of Systems  Constitutional:  Positive for fever.  All other systems reviewed and are negative.  Physical Exam Updated Vital Signs BP (!) 178/110 (BP Location: Left Arm)    Pulse 87    Temp 98.1 F (36.7 C) (Oral)    Resp 13    Ht 5\' 7"  (1.702 m)    Wt 99.8 kg    SpO2 98%    BMI 34.46 kg/m   Physical Exam Vitals and nursing Reid reviewed.  Constitutional:      Appearance: He is well-developed. He is diaphoretic.  HENT:     Head: Normocephalic and atraumatic.  Cardiovascular:     Rate and Rhythm: Regular rhythm. Tachycardia present.     Heart sounds: No murmur heard. Pulmonary:     Effort: Pulmonary effort is normal. No respiratory distress.     Breath sounds: Normal breath sounds.  Abdominal:     Comments: There is abdominal distention.  There is mild tenderness to palpation over the right hemiabdomen.  Surgical sites are clean, dry and intact.  There is erythema surrounding the inferior surgical sites and local tenderness to palpation in this region.  There is no active drainage.  Musculoskeletal:        General: No swelling or tenderness.  Skin:    General: Skin is warm.  Neurological:     Mental Status: He is alert and oriented to person, place, and time.  Psychiatric:        Behavior: Behavior normal.     ED Results / Procedures / Treatments   Labs (all labs ordered are listed, but only abnormal  results are displayed) Labs Reviewed  COMPREHENSIVE METABOLIC PANEL - Abnormal; Notable for the following components:      Result Value   Sodium 134 (*)    Glucose, Bld 113 (*)    Creatinine, Ser 1.67 (*)    Total Protein 8.6 (*)    Total Bilirubin 1.6 (*)    GFR, Estimated 50 (*)    All other components within normal limits  CBC WITH DIFFERENTIAL/PLATELET - Abnormal; Notable for the following components:   WBC 11.2 (*)  Neutro Abs 7.8 (*)    Monocytes Absolute 1.1 (*)    All other components within normal limits  I-STAT CHEM 8, ED - Abnormal; Notable for the following components:   Creatinine, Ser 1.70 (*)    Glucose, Bld 113 (*)    All other components within normal limits  RESP PANEL BY RT-PCR (FLU A&B, COVID) ARPGX2  CULTURE, BLOOD (ROUTINE X 2)  CULTURE, BLOOD (ROUTINE X 2)  LACTIC ACID, PLASMA  PROTIME-INR  LACTIC ACID, PLASMA  URINALYSIS, ROUTINE W REFLEX MICROSCOPIC    EKG EKG Interpretation  Date/Time:  Saturday March 01 2021 00:58:52 EST Ventricular Rate:  85 PR Interval:  144 QRS Duration: 75 QT Interval:  323 QTC Calculation: 384 R Axis:   26 Text Interpretation: Sinus rhythm Confirmed by Quintella Reichert [id number] on 03/01/2021 3:27:47 AM  Radiology CT Abdomen Pelvis Wo Contrast  Result Date: 03/01/2021 CLINICAL DATA:  Right nephrectomy 4 days ago with postoperative fever and abdominal swelling. EXAM: CT ABDOMEN AND PELVIS WITHOUT CONTRAST TECHNIQUE: Multidetector CT imaging of the abdomen and pelvis was performed following the standard protocol without IV contrast. COMPARISON:  MRI abdomen without and with contrast 01/21/2021, CT abdomen and pelvis no contrast 01/20/2021. FINDINGS: Lower chest: No acute process. The cardiac size is normal. A 3 mm left lower lobe nodule is noted on axial image 9 of series 4. Given the patient's high risk status with known renal carcinoma, a 1 year follow-up chest CT is recommended. Hepatobiliary: 17 cm in length mildly  steatotic liver. No focal abnormality without contrast. Pancreas: No focal abnormality without contrast or ductal dilatation. Spleen: Mildly prominent measuring 14.5 cm AP, with splenules in the splenic hilum. No focal abnormality without contrast. Adrenals/Urinary Tract: No adrenal mass bilaterally. No focal noncontrasted left renal abnormality. Interval right nephrectomy. There is a thin walled low-density fluid collection posteriorly in the right renal fossa measuring 6.3 x 2.7 x 5.5 cm and 9.2 Hounsfield units below the expected density of hematoma and most likely representing a seroma. An abscess is possible but the wall would usually be thicker and the fluid would usually be more dense. There is mild stranding in the right renal fossa most likely postoperative in etiology and scattered air droplets above the fluid but not within the fluid collection. There is no evidence of urinary stones or obstruction. No bladder thickening is seen. There is trace air in the anterior bladder. Stomach/Bowel: No dilatation or wall thickening including the appendix. Mild constipation. There are colonic diverticula without evidence of colitis or diverticulitis. Vascular/Lymphatic: Scattered trace aortic calcific plaques. No lymphadenopathy is seen. Reproductive: Normal prostate. Other: There is patchy soft tissue gas in the subcutaneous plane in the right abdominal wall, minimal scattered free intraperitoneal air on the right including a small amount in the low anterior right pelvis, and scattered air pockets in between the right abdominal wall muscular layers. There is no fluid in the pelvis. Right retroperitoneal induration is seen which probably due to the recent surgery. There are small umbilical and inguinal fat hernias. Musculoskeletal: No concerning regional bone lesion. IMPRESSION: 1. 6.3 x 2.7 by 5.5 cm thin walled low-density fluid collection in the right renal fossa, below the typical density of hematoma and probably  representing a seroma or less likely abscess. 2. Trace free air in the right renal fossa, additional free intraperitoneal air scattered along the liver and right abdominal wall undersurface into the pelvis, likely postsurgical in etiology. Correlate clinically for perforated bowel. Follow-up as indicated. 3. Additional  patchy soft tissue gas subcutaneously in the right abdominal wall and between the right abdominal wall muscular layers, could be normal postsurgical change or infectious process but no abscess is seen. 4. Minimal air in the bladder, could be due to recent instrumentation or infectious process. No bladder thickening. 5. Mildly prominent liver and spleen are mild liver steatosis. 6. Constipation and diverticulosis. 7. 3 mm left lower lobe nodule. One year follow-up chest CT recommended. Electronically Signed   By: Telford Nab M.D.   On: 03/01/2021 02:46   DG Chest Port 1 View  Result Date: 03/01/2021 CLINICAL DATA:  Status post recent nephrectomy with subsequent fever. EXAM: PORTABLE CHEST 1 VIEW COMPARISON:  January 30, 2021 FINDINGS: The heart size and mediastinal contours are within normal limits. Both lungs are clear. The visualized skeletal structures are unremarkable. IMPRESSION: No active disease. Electronically Signed   By: Virgina Norfolk M.D.   On: 03/01/2021 00:18    Procedures Procedures   Medications Ordered in ED Medications  iohexol (OMNIPAQUE) 9 MG/ML oral solution (has no administration in time range)  lactated ringers bolus 1,000 mL (0 mLs Intravenous Stopped 03/01/21 0237)  iohexol (OMNIPAQUE) 350 MG/ML injection 80 mL (80 mLs Intravenous Contrast Given 03/01/21 0208)  cefTRIAXone (ROCEPHIN) 1 g in sodium chloride 0.9 % 100 mL IVPB (0 g Intravenous Stopped 03/01/21 0323)  acetaminophen (TYLENOL) tablet 650 mg (650 mg Oral Given 03/01/21 0335)    ED Course  I have reviewed the triage vital signs and the nursing notes.  Pertinent labs & imaging results that  were available during my care of the patient were reviewed by me and considered in my medical decision making (see chart for details).    MDM Rules/Calculators/A&P                         Patient here for evaluation of fevers following recent nephrectomy on December 13.  He is nontoxic-appearing on evaluation.  He does have some mild overlying erythema at the inferior surgical site concerning for possible developing soft tissue infection.  Labs with stable renal insufficiency, mild leukocytosis.  His lactic acid is within normal limits.  CT scan was obtained, which shows appropriate postoperative changes.  No evidence of deep tissue space infection.  No evidence of necrotizing soft tissue infection on examination.  Presentation is not consistent with acute PE, no lower extremity edema, no hypoxia.  Patient has been mobile at home.  He was evaluated by Dr. Abner Greenspan with Urology in the emergency department.  Plan to start on oral antibiotics with close outpatient follow-up and return precautions.    Final Clinical Impression(s) / ED Diagnoses Final diagnoses:  Cellulitis of abdominal wall  Pulmonary nodule    Rx / DC Orders ED Discharge Orders          Ordered    sulfamethoxazole-trimethoprim (BACTRIM DS) 800-160 MG tablet  2 times daily        03/01/21 0406             Quintella Reichert, MD 03/01/21 [phone]

## 2021-03-06 LAB — CULTURE, BLOOD (ROUTINE X 2)
Culture: NO GROWTH
Special Requests: ADEQUATE

## 2021-03-24 ENCOUNTER — Telehealth: Payer: Self-pay | Admitting: *Deleted

## 2021-03-24 NOTE — Telephone Encounter (Signed)
Transition Care Management Unsuccessful Follow-up Telephone Call  Date of discharge and from where:  02/26/21 [facility]  Attempts:  1st Attempt  Reason for unsuccessful TCM follow-up call:  Left voice message   Kelli Churn RN, CCM, [place] Management Coordinator - Managed [place] High Risk [phone]

## 2021-03-25 ENCOUNTER — Telehealth: Payer: Self-pay | Admitting: *Deleted

## 2021-03-25 NOTE — Telephone Encounter (Signed)
Transition Care Management Follow-up Telephone Call Date of discharge and from where: 02/26/21- [facility]  How have you been since you were released from the hospital? "Doing OK"- states incisional cellulitis that was diagnosed in ED on 02/28/21 has resolved after he completed full course of antibiotics Any questions or concerns? No  Items Reviewed: Did the pt receive and understand the discharge instructions provided? Yes  Medications obtained and verified? Yes  Other? Yes  Any new allergies since your discharge? No  Dietary orders reviewed? Yes Do you have support at home? Yes   Home Care and Equipment/Supplies: Were home health services ordered? no If so, what is the name of the agency? Not applicable  Has the agency set up a time to come to the patient's home? not applicable Were any new equipment or medical supplies ordered?  No What is the name of the medical supply agency? Not applicable Were you able to get the supplies/equipment? not applicable Do you have any questions related to the use of the equipment or supplies? Not applicable  Functional Questionnaire: (I = Independent and D = Dependent) ADLs: I  Bathing/Dressing- I  Meal Prep- I  Eating- I  Maintaining continence- I  Transferring/Ambulation- I  Managing Meds- I  Follow up appointments reviewed:  PCP Hospital f/u appt confirmed? Yes  Scheduled to see Dr Birdie Riddle on 06/10/21 @ 9:30 am. [facility] f/u appt confirmed? Yes  Scheduled to see Surgical NP on 03/27/21 Are transportation arrangements needed? No  If their condition worsens, is the pt aware to call PCP or go to the Emergency Dept.? Yes Was the patient provided with contact information for the PCP's office or ED? Yes Was to pt encouraged to call back with questions or concerns? Yes

## 2021-04-18 ENCOUNTER — Other Ambulatory Visit: Payer: Self-pay | Admitting: Family Medicine

## 2021-06-10 ENCOUNTER — Ambulatory Visit (INDEPENDENT_AMBULATORY_CARE_PROVIDER_SITE_OTHER): Payer: 59 | Admitting: Family Medicine

## 2021-06-10 ENCOUNTER — Encounter: Payer: Self-pay | Admitting: Family Medicine

## 2021-06-10 ENCOUNTER — Other Ambulatory Visit: Payer: Self-pay

## 2021-06-10 VITALS — BP 118/82 | HR 67 | Temp 97.9°F | Resp 16 | Wt 219.8 lb

## 2021-06-10 DIAGNOSIS — D225 Melanocytic nevi of trunk: Secondary | ICD-10-CM | POA: Diagnosis not present

## 2021-06-10 DIAGNOSIS — R0602 Shortness of breath: Secondary | ICD-10-CM | POA: Diagnosis not present

## 2021-06-10 DIAGNOSIS — E785 Hyperlipidemia, unspecified: Secondary | ICD-10-CM | POA: Diagnosis not present

## 2021-06-10 LAB — HEPATIC FUNCTION PANEL
ALT: 32 U/L (ref 0–53)
AST: 28 U/L (ref 0–37)
Albumin: 5 g/dL (ref 3.5–5.2)
Alkaline Phosphatase: 48 U/L (ref 39–117)
Bilirubin, Direct: 0.2 mg/dL (ref 0.0–0.3)
Total Bilirubin: 0.8 mg/dL (ref 0.2–1.2)
Total Protein: 7.6 g/dL (ref 6.0–8.3)

## 2021-06-10 LAB — CBC WITH DIFFERENTIAL/PLATELET
Basophils Absolute: 0 10*3/uL (ref 0.0–0.1)
Basophils Relative: 0.4 % (ref 0.0–3.0)
Eosinophils Absolute: 0.1 10*3/uL (ref 0.0–0.7)
Eosinophils Relative: 1.8 % (ref 0.0–5.0)
HCT: 42.8 % (ref 39.0–52.0)
Hemoglobin: 15 g/dL (ref 13.0–17.0)
Lymphocytes Relative: 26.7 % (ref 12.0–46.0)
Lymphs Abs: 1.9 10*3/uL (ref 0.7–4.0)
MCHC: 35 g/dL (ref 30.0–36.0)
MCV: 81.8 fl (ref 78.0–100.0)
Monocytes Absolute: 0.5 10*3/uL (ref 0.1–1.0)
Monocytes Relative: 7.7 % (ref 3.0–12.0)
Neutro Abs: 4.5 10*3/uL (ref 1.4–7.7)
Neutrophils Relative %: 63.4 % (ref 43.0–77.0)
Platelets: 231 10*3/uL (ref 150.0–400.0)
RBC: 5.23 Mil/uL (ref 4.22–5.81)
RDW: 13.2 % (ref 11.5–15.5)
WBC: 7.1 10*3/uL (ref 4.0–10.5)

## 2021-06-10 LAB — LIPID PANEL
Cholesterol: 171 mg/dL (ref 0–200)
HDL: 39.7 mg/dL (ref >39.00)
NonHDL: 130.96
Total CHOL/HDL Ratio: 4
Triglycerides: 271 mg/dL — ABNORMAL HIGH (ref 0.0–149.0)
VLDL: 54.2 mg/dL — ABNORMAL HIGH (ref 0.0–40.0)

## 2021-06-10 LAB — BASIC METABOLIC PANEL
BUN: 21 mg/dL (ref 6–23)
CO2: 27 mEq/L (ref 19–32)
Calcium: 10.1 mg/dL (ref 8.4–10.5)
Chloride: 100 mEq/L (ref 96–112)
Creatinine, Ser: 1.71 mg/dL — ABNORMAL HIGH (ref 0.40–1.50)
GFR: 46.73 mL/min — ABNORMAL LOW (ref >60.00)
Glucose, Bld: 99 mg/dL (ref 70–99)
Potassium: 4.2 mEq/L (ref 3.5–5.1)
Sodium: 137 mEq/L (ref 135–145)

## 2021-06-10 LAB — TSH: TSH: 2.01 u[IU]/mL (ref 0.35–5.50)

## 2021-06-10 LAB — LDL CHOLESTEROL, DIRECT: Direct LDL: 99 mg/dL

## 2021-06-10 NOTE — Patient Instructions (Signed)
Schedule your complete physical in 6 months ?We'll notify you of your lab results and make any changes if needed ?We'll call you with your pulmonary and dermatology appts ?Continue to work on healthy diet and regular exercise- you can do it! ?Call with any questions or concerns ?Happy Spring!!! ?

## 2021-06-10 NOTE — Assessment & Plan Note (Signed)
Chronic problem.  Tolerating Lipitor 20mg daily w/o difficulty.  Check labs.  Adjust meds prn  

## 2021-06-10 NOTE — Progress Notes (Signed)
? ?Subjective:  ? ? Patient ID: Dakota Reid, male    DOB: 1970/12/09, 51 y.o.   MRN: [id number] ? ?HPI ?Hyperlipidemia- chronic problem, on Lipitor '20mg'$  daily.  No CP, abd pain, N/V. ? ?SOB- pt had similar sxs and was referred to pulmonary but sxs resolved and he didn't need the appt.  Pt reports sxs are intermittent and will last for 'a few days' and then resolve spontaneously.  When sxs are occurring it feels as if he can't get a full breath.  Has to be more aware of his breathing and take deeper breaths.  At times has to tripod to feel that he's adequately breathing.  Sxs are worse at night and are even more difficult when wearing CPAP.  Denies GERD or indigestion.  He reports home pulse ox is always normal. ? ?Atypical Nevus- L flank, TTP, initially would bleed.  First noticed ~1 yr ago.  Does not have derm. ? ? ?Review of Systems ?For ROS see HPI  ? ?This visit occurred during the SARS-CoV-2 public health emergency.  Safety protocols were in place, including screening questions prior to the visit, additional usage of staff PPE, and extensive cleaning of exam room while observing appropriate contact time as indicated for disinfecting solutions.   ?   ?Objective:  ? Physical Exam ?Vitals reviewed.  ?Constitutional:   ?   General: He is not in acute distress. ?   Appearance: He is well-developed. He is obese. He is not ill-appearing.  ?HENT:  ?   Head: Normocephalic and atraumatic.  ?Eyes:  ?   Extraocular Movements: Extraocular movements intact.  ?   Conjunctiva/sclera: Conjunctivae normal.  ?   Pupils: Pupils are equal, round, and reactive to light.  ?Neck:  ?   Thyroid: No thyromegaly.  ?Cardiovascular:  ?   Rate and Rhythm: Normal rate and regular rhythm.  ?   Pulses: Normal pulses.  ?   Heart sounds: Normal heart sounds. No murmur heard. ?Pulmonary:  ?   Effort: Pulmonary effort is normal. No respiratory distress.  ?   Breath sounds: Normal breath sounds. No decreased breath sounds, wheezing, rhonchi or  rales.  ?Abdominal:  ?   General: Bowel sounds are normal. There is no distension.  ?   Palpations: Abdomen is soft.  ?Musculoskeletal:  ?   Cervical back: Normal range of motion and neck supple.  ?   Right lower leg: No edema.  ?   Left lower leg: No edema.  ?Lymphadenopathy:  ?   Cervical: No cervical adenopathy.  ?Skin: ?   General: Skin is warm and dry.  ?   Comments: Irritated nevus on L flank  ?Neurological:  ?   General: No focal deficit present.  ?   Mental Status: He is alert and oriented to person, place, and time.  ?   Cranial Nerves: No cranial nerve deficit.  ?Psychiatric:     ?   Mood and Affect: Mood normal.     ?   Behavior: Behavior normal.  ? ? ? ? ? ?   ?Assessment & Plan:  ? ?SOB- new to provider, recurrent problem for pt.  States this spontaneously resolved in the past.  Lungs CTAB today.  What he's describing almost seems like a cadence issues- shallow breaths until he takes a big breath to 'catch up'.  But given his recent kidney cancer will refer to pulmonary for a complete evaluation.  Pt expressed understanding and is in agreement w/ plan.  ? ?Atypical  Nevus- new.  Appears to be irritated rather than malignant but since it is painful for pt will refer to derm.  Pt expressed understanding and is in agreement w/ plan.  ?

## 2021-06-24 ENCOUNTER — Other Ambulatory Visit (HOSPITAL_COMMUNITY): Payer: Self-pay | Admitting: Urology

## 2021-06-24 ENCOUNTER — Ambulatory Visit (HOSPITAL_COMMUNITY)
Admission: RE | Admit: 2021-06-24 | Discharge: 2021-06-24 | Disposition: A | Discharge status: Home / Self Care | Payer: 59 | Source: Ambulatory Visit | Attending: Urology | Admitting: Urology

## 2021-06-24 DIAGNOSIS — C642 Malignant neoplasm of left kidney, except renal pelvis: Secondary | ICD-10-CM | POA: Insufficient documentation

## 2021-07-23 ENCOUNTER — Encounter: Payer: Self-pay | Admitting: Pulmonary Disease

## 2021-07-23 ENCOUNTER — Ambulatory Visit (INDEPENDENT_AMBULATORY_CARE_PROVIDER_SITE_OTHER): Payer: 59 | Admitting: Pulmonary Disease

## 2021-07-23 VITALS — BP 124/74 | HR 74 | Temp 98.8°F | Ht 67.0 in | Wt 214.2 lb

## 2021-07-23 DIAGNOSIS — R0609 Other forms of dyspnea: Secondary | ICD-10-CM | POA: Diagnosis not present

## 2021-07-23 NOTE — Patient Instructions (Signed)
Nice to meet you ? ?To further investigate your symptoms I have ordered pulmonary function test to be scheduled at your convenience, either next available or at time of your follow-up visit with me in a few weeks ? ?In addition, I ordered an echocardiogram or heart ultrasound.  Someone will call and schedule this for you. ? ?Return to clinic in 6 weeks or sooner as needed with Dr. Silas Flood ?

## 2021-08-23 ENCOUNTER — Encounter: Payer: Self-pay | Admitting: Pulmonary Disease

## 2021-08-23 NOTE — Progress Notes (Signed)
$'@Patient'S$  ID: Dakota Reid, male    DOB: 10-20-1970, 51 y.o.   MRN: [id number]  Chief Complaint  Patient presents with   Consult    Pt states that he randomly has SOB at times. He states he can breath but he is unable to fully catch his breath at times. He states that this has been occurring for a few months now. Pt states he is unsure of the cause.     Referring provider: Midge Minium, MD  HPI:   51 y.o. man whom we are seeing in consultation for evaluation of shortness of breath.  Note from referring provider reviewed.  Patient reports intermittent history of shortness of breath.  Difficulty getting a deep breath in.  Almost like a tightness but not quite.  Present at rest.  Sometimes with exertion but not reliably so.  No time of day where things are better or worse.  No positional changes make things better or worse.  No seasonal environmental factors he can identify to make things better or worse.  No alleviating or exacerbating factors.  Most recent chest x-ray 06/24/2021 reviewed and interpreted as clear lungs bilaterally.  Previous chest x-ray 03/01/2021 reviewed interpreted as clear lungs bilaterally.  PMH: Hypertension, hyperlipidemia Surgical history: Laparoscopic nephrectomy Family history: Father with hypertension, mother with diabetes, cholangiocarcinoma Social history: Former cancer, quit around 2010, lives in [place] / Pulmonary Flowsheets:   ACT:      No data to display          MMRC:     No data to display          Epworth:      No data to display          Tests:   FENO:  No results found for: "NITRICOXIDE"  PFT:     No data to display          WALK:      No data to display          Imaging: Personally reviewed No results found.  Lab Results: Personally reviewed, no anemia CBC    Component Value Date/Time   WBC 7.1 06/10/2021 1011   RBC 5.23 06/10/2021 1011   HGB 15.0 06/10/2021 1011   HCT  42.8 06/10/2021 1011   PLT 231.0 06/10/2021 1011   MCV 81.8 06/10/2021 1011   MCH 28.4 03/01/2021 0015   MCHC 35.0 06/10/2021 1011   RDW 13.2 06/10/2021 1011   LYMPHSABS 1.9 06/10/2021 1011   MONOABS 0.5 06/10/2021 1011   EOSABS 0.1 06/10/2021 1011   BASOSABS 0.0 06/10/2021 1011    BMET    Component Value Date/Time   NA 137 06/10/2021 1011   K 4.2 06/10/2021 1011   CL 100 06/10/2021 1011   CO2 27 06/10/2021 1011   GLUCOSE 99 06/10/2021 1011   BUN 21 06/10/2021 1011   CREATININE 1.71 (H) 06/10/2021 1011   CREATININE 1.01 11/02/2014 1550   CALCIUM 10.1 06/10/2021 1011   GFRNONAA 50 (L) 03/01/2021 0015    BNP No results found for: "BNP"  ProBNP No results found for: "PROBNP"  Specialty Problems       Pulmonary Problems   OSA (obstructive sleep apnea)   Obstructive sleep apnea treated with continuous positive airway pressure (CPAP)    No Known Allergies  Immunization History  Administered Date(s) Administered   Influenza Inj Mdck Quad Pf 12/12/2018   Influenza,inj,Quad PF,6+ Mos 11/26/2016, 12/21/2017, 12/11/2020   Influenza-Unspecified 12/04/2014  Moderna Sars-Covid-2 Vaccination 06/08/2019, 07/18/2019   Tdap 09/15/2010, 12/11/2020    Past Medical History:  Diagnosis Date   History of kidney stones    Hyperlipidemia    Hypertension    Sleep apnea    CPAP    Tobacco History: Social History   Tobacco Use  Smoking Status Former   Types: Cigarettes   Quit date: 03/17/2007   Years since quitting: 14.4  Smokeless Tobacco Never   Counseling given: Not Answered   Continue to not smoke  Outpatient Encounter Medications as of 07/23/2021  Medication Sig   atorvastatin (LIPITOR) 20 MG tablet TAKE 1 TABLET BY MOUTH  DAILY AT 6 PM   cloNIDine (CATAPRES) 0.1 MG tablet TAKE 1 TABLET BY MOUTH  TWICE DAILY   fenofibrate 160 MG tablet TAKE 1 TABLET BY MOUTH  DAILY   fluticasone (FLONASE) 50 MCG/ACT nasal spray Place 2 sprays into both nostrils daily.    multivitamin (ONE-A-DAY MEN'S) TABS Take 1 tablet by mouth daily.   No facility-administered encounter medications on file as of 07/23/2021.     Review of Systems  Review of Systems  No chest pain with exertion.  No orthopnea or PND.  No lower extremity swelling.  Comprehensive review of systems otherwise negative. Physical Exam  BP 124/74 (BP Location: Left Arm, Patient Position: Sitting, Cuff Size: Normal)   Pulse 74   Temp 98.8 F (37.1 C) (Oral)   Ht '5\' 7"'$  (1.702 m)   Wt 214 lb 3.2 oz (97.2 kg)   SpO2 99%   BMI 33.55 kg/m   Wt Readings from Last 5 Encounters:  07/23/21 214 lb 3.2 oz (97.2 kg)  06/10/21 219 lb 12.8 oz (99.7 kg)  02/28/21 220 lb (99.8 kg)  02/25/21 220 lb (99.8 kg)  02/18/21 220 lb (99.8 kg)    BMI Readings from Last 5 Encounters:  07/23/21 33.55 kg/m  06/10/21 34.43 kg/m  02/28/21 34.46 kg/m  02/25/21 34.46 kg/m  02/18/21 34.46 kg/m     Physical Exam General: Well-appearing, no acute distress Eyes: EOMI, no icterus Neck: Supple, no JVD Cardiovascular: Regular rate and rhythm, no murmur Pulmonary: Clear, normal work of breathing Abdomen: Nondistended, bowel sounds present MSK: No synovitis, no joint effusion Neuro: Normal gait, no weakness Psychiatry: Normal mood, full affect  Assessment & Plan:   Shortness of breath: Intermittent, stuttering.  Sometimes present with exertion.  Sometimes at rest.  Almost like a tightness, difficulty getting a deep breath.  Unclear etiology.  Chest x-ray clear.  PFTs and echocardiogram ordered for further evaluation.  Can continue empiric ICS/LABA therapy, bronchodilators if evaluation is normal see if there is some symptomatic improvement.   Return in about 6 weeks (around 09/03/2021).   Lanier Clam, MD 08/23/2021

## 2021-08-25 ENCOUNTER — Ambulatory Visit (HOSPITAL_COMMUNITY)
Admission: RE | Admit: 2021-08-25 | Discharge: 2021-08-25 | Disposition: A | Discharge status: Home / Self Care | Payer: 59 | Source: Ambulatory Visit | Attending: Pulmonary Disease | Admitting: Pulmonary Disease

## 2021-08-25 DIAGNOSIS — I10 Essential (primary) hypertension: Secondary | ICD-10-CM | POA: Insufficient documentation

## 2021-08-25 DIAGNOSIS — R0600 Dyspnea, unspecified: Secondary | ICD-10-CM | POA: Diagnosis present

## 2021-08-25 DIAGNOSIS — R0609 Other forms of dyspnea: Secondary | ICD-10-CM | POA: Diagnosis not present

## 2021-08-25 DIAGNOSIS — E785 Hyperlipidemia, unspecified: Secondary | ICD-10-CM | POA: Diagnosis not present

## 2021-08-25 LAB — ECHOCARDIOGRAM COMPLETE
Area-P 1/2: 2.99 cm2
S' Lateral: 2.7 cm

## 2021-09-10 ENCOUNTER — Ambulatory Visit (INDEPENDENT_AMBULATORY_CARE_PROVIDER_SITE_OTHER): Payer: 59 | Admitting: Pulmonary Disease

## 2021-09-10 ENCOUNTER — Encounter: Payer: Self-pay | Admitting: Pulmonary Disease

## 2021-09-10 VITALS — BP 114/68 | HR 79 | Temp 97.9°F | Ht 68.0 in | Wt 211.4 lb

## 2021-09-10 DIAGNOSIS — R0602 Shortness of breath: Secondary | ICD-10-CM

## 2021-09-10 DIAGNOSIS — R0609 Other forms of dyspnea: Secondary | ICD-10-CM | POA: Diagnosis not present

## 2021-09-10 MED ORDER — STIOLTO RESPIMAT 2.5-2.5 MCG/ACT IN AERS: 2.0000 | INHALATION_SPRAY | Freq: Every day | RESPIRATORY_TRACT | 0 refills | Status: DC

## 2021-09-10 NOTE — Progress Notes (Signed)
PFT done today. 

## 2021-09-10 NOTE — Patient Instructions (Signed)
Neck to see you again  Your pulmonary function test are largely normal with the exception of some air trapping.  This means that when you blow out there is extra air in your chest.  This can possibly contribute to the symptoms you are experiencing.  To combat this, you Stiolto 2 puffs once a day every day.  See me a message in the next couple weeks and let me know if it is helpful.  If it is I will prescribe it and arrange follow-up to keep prescriptions active etc.  If is not helpful, let me know.  I will not schedule further follow-up visits if the inhalers have not been helpful.  For now, return to clinic as needed.

## 2021-09-11 NOTE — Progress Notes (Signed)
$'@Patient'R$  ID: Dakota Reid, male    DOB: [birth year], 51 y.o.   MRN: [id number]  Chief Complaint  Patient presents with   Follow-up    Pt states he is still having the same issues as before. Pt states that his SOB is more frequent. Pt states he done his Echo.    Referring provider: Midge Minium, MD  HPI:   51 y.o. man whom we are seeing in follow-up for evaluation of shortness of breath.    Accompanied by wife.  Again describes sensation of difficulty getting deep breath occasionally.  Usually at rest.  Denies any dyspnea.  Sensation largely unchanged.  States when forces exhale out slowly he can then get a little bit deeper breath.  Echocardiogram obtained in the interim largely normal without significant abnormality.  Would not explain his symptoms.  PFTs obtained today.  These were personally reviewed and interpreted as below.  Main take away is evidence of air trapping, no other abnormality.  Discussed at length the role and rationale for bronchodilator therapy to see if helps with symptoms given findings on PFTs.  HPI at initial visit: Patient reports intermittent history of shortness of breath.  Difficulty getting a deep breath in.  Almost like a tightness but not quite.  Present at rest.  Sometimes with exertion but not reliably so.  No time of day where things are better or worse.  No positional changes make things better or worse.  No seasonal environmental factors he can identify to make things better or worse.  No alleviating or exacerbating factors.  Most recent chest x-ray 06/24/2021 reviewed and interpreted as clear lungs bilaterally.  Previous chest x-ray 03/01/2021 reviewed interpreted as clear lungs bilaterally.  PMH: Hypertension, hyperlipidemia Surgical history: Laparoscopic nephrectomy Family history: Father with hypertension, mother with diabetes, cholangiocarcinoma Social history: Former cancer, quit around 2010, lives in [place] / Pulmonary  Flowsheets:   ACT:      No data to display           MMRC:     No data to display           Epworth:      No data to display           Tests:   FENO:  No results found for: "NITRICOXIDE"  PFT:     No data to display         08/2021: Personally reviewed and interpreted as normal spirometry, no bronchodilator response, TLC within normal limits, evidence of air trapping, DLCO within normal limits.  WALK:      No data to display           Imaging: Personally reviewed ECHOCARDIOGRAM COMPLETE  Result Date: 08/25/2021    ECHOCARDIOGRAM REPORT   Patient Name:   Dakota Reid Date of Exam: 08/25/2021 Medical Rec #:  [id number]     Height:       67.0 in Accession #:    [id number]    Weight:       214.2 lb Date of Birth:  1971-01-29     BSA:          2.082 m Patient Age:    51 years      BP:           124/74 mmHg Patient Gender: M             HR:           63 bpm. Exam Location:  [address] Procedure: 2D Echo, Cardiac Doppler, Color Doppler, 3D Echo and Strain Analysis Indications:    Dyspnea R06.00  History:        Patient has no prior history of Echocardiogram examinations.                 Risk Factors:Dyslipidemia and Hypertension.  Sonographer:    Mikki Santee RDCS Referring Phys: [place]  1. Left ventricular ejection fraction, by estimation, is 60 to 65%. The left ventricle has normal function. The left ventricle has no regional wall motion abnormalities. Left ventricular diastolic parameters were normal. The average left ventricular global longitudinal strain is -20.5 %. The global longitudinal strain is normal.  2. Right ventricular systolic function is normal. The right ventricular size is normal. There is normal pulmonary artery systolic pressure.  3. The mitral valve is normal in structure. Trivial mitral valve regurgitation. No evidence of mitral stenosis.  4. The aortic valve is tricuspid. Aortic valve regurgitation is not  visualized. No aortic stenosis is present.  5. The inferior vena cava is normal in size with greater than 50% respiratory variability, suggesting right atrial pressure of 3 mmHg. FINDINGS  Left Ventricle: Left ventricular ejection fraction, by estimation, is 60 to 65%. The left ventricle has normal function. The left ventricle has no regional wall motion abnormalities. The average left ventricular global longitudinal strain is -20.5 %. The global longitudinal strain is normal. The left ventricular internal cavity size was normal in size. There is no left ventricular hypertrophy. Left ventricular diastolic parameters were normal. Normal left ventricular filling pressure. Right Ventricle: The right ventricular size is normal. No increase in right ventricular wall thickness. Right ventricular systolic function is normal. There is normal pulmonary artery systolic pressure. The tricuspid regurgitant velocity is 1.37 m/s, and  with an assumed right atrial pressure of 3 mmHg, the estimated right ventricular systolic pressure is 16.1 mmHg. Left Atrium: Left atrial size was normal in size. Right Atrium: Right atrial size was normal in size. Pericardium: There is no evidence of pericardial effusion. Mitral Valve: The mitral valve is normal in structure. Trivial mitral valve regurgitation. No evidence of mitral valve stenosis. Tricuspid Valve: The tricuspid valve is normal in structure. Tricuspid valve regurgitation is not demonstrated. No evidence of tricuspid stenosis. Aortic Valve: The aortic valve is tricuspid. Aortic valve regurgitation is not visualized. No aortic stenosis is present. Pulmonic Valve: The pulmonic valve was normal in structure. Pulmonic valve regurgitation is trivial. No evidence of pulmonic stenosis. Aorta: The aortic root is normal in size and structure. Venous: The inferior vena cava is normal in size with greater than 50% respiratory variability, suggesting right atrial pressure of 3 mmHg. IAS/Shunts:  No atrial level shunt detected by color flow Doppler.  LEFT VENTRICLE PLAX 2D LVIDd:         4.20 cm   Diastology LVIDs:         2.70 cm   LV e' medial:    7.89 cm/s LV PW:         1.00 cm   LV E/e' medial:  7.1 LV IVS:        1.00 cm   LV e' lateral:   10.20 cm/s LVOT diam:     2.30 cm   LV E/e' lateral: 5.5 LV SV:         63 LV SV Index:   30        2D Longitudinal Strain LVOT Area:  4.15 cm  2D Strain GLS (A2C):   -23.7 %                          2D Strain GLS (A3C):   18.3 %                          2D Strain GLS (A4C):   -19.5 %                          2D Strain GLS Avg:     -20.5 %                           3D Volume EF:                          3D EF:        63 %                          LV EDV:       145 ml                          LV ESV:       54 ml                          LV SV:        91 ml RIGHT VENTRICLE RV Basal diam:  3.50 cm RV Mid diam:    3.20 cm RV S prime:     11.10 cm/s TAPSE (M-mode): 2.0 cm LEFT ATRIUM             Index        RIGHT ATRIUM           Index LA diam:        3.50 cm 1.68 cm/m   RA Area:     14.10 cm LA Vol (A2C):   39.7 ml 19.07 ml/m  RA Volume:   35.80 ml  17.19 ml/m LA Vol (A4C):   27.0 ml 12.97 ml/m LA Biplane Vol: 34.6 ml 16.62 ml/m  AORTIC VALVE LVOT Vmax:   70.20 cm/s LVOT Vmean:  45.500 cm/s LVOT VTI:    0.152 m  AORTA Ao Root diam: 3.40 cm Ao Asc diam:  3.30 cm MITRAL VALVE               TRICUSPID VALVE MV Area (PHT): 2.99 cm    TR Peak grad:   7.5 mmHg MV Decel Time: 254 msec    TR Vmax:        137.00 cm/s MV E velocity: 56.20 cm/s MV A velocity: 50.40 cm/s  SHUNTS MV E/A ratio:  1.12        Systemic VTI:  0.15 m                            Systemic Diam: 2.30 cm Skeet Latch MD Electronically signed by Skeet Latch MD Signature Date/Time: 08/25/2021/12:01:58 PM    Final     Lab Results: Personally reviewed, no anemia CBC    Component Value Date/Time   WBC 7.1 06/10/2021 1011   RBC 5.23 06/10/2021 1011   HGB  15.0 06/10/2021 1011   HCT 42.8  06/10/2021 1011   PLT 231.0 06/10/2021 1011   MCV 81.8 06/10/2021 1011   MCH 28.4 03/01/2021 0015   MCHC 35.0 06/10/2021 1011   RDW 13.2 06/10/2021 1011   LYMPHSABS 1.9 06/10/2021 1011   MONOABS 0.5 06/10/2021 1011   EOSABS 0.1 06/10/2021 1011   BASOSABS 0.0 06/10/2021 1011    BMET    Component Value Date/Time   NA 137 06/10/2021 1011   K 4.2 06/10/2021 1011   CL 100 06/10/2021 1011   CO2 27 06/10/2021 1011   GLUCOSE 99 06/10/2021 1011   BUN 21 06/10/2021 1011   CREATININE 1.71 (H) 06/10/2021 1011   CREATININE 1.01 11/02/2014 1550   CALCIUM 10.1 06/10/2021 1011   GFRNONAA 50 (L) 03/01/2021 0015    BNP No results found for: "BNP"  ProBNP No results found for: "PROBNP"  Specialty Problems       Pulmonary Problems   OSA (obstructive sleep apnea)   Obstructive sleep apnea treated with continuous positive airway pressure (CPAP)    No Known Allergies  Immunization History  Administered Date(s) Administered   Influenza Inj Mdck Quad Pf 12/12/2018   Influenza,inj,Quad PF,6+ Mos 11/26/2016, 12/21/2017, 12/11/2020   Influenza-Unspecified 12/04/2014   Moderna Sars-Covid-2 Vaccination 06/08/2019, 07/18/2019   Tdap 09/15/2010, 12/11/2020    Past Medical History:  Diagnosis Date   History of kidney stones    Hyperlipidemia    Hypertension    Sleep apnea    CPAP    Tobacco History: Social History   Tobacco Use  Smoking Status Former   Types: Cigarettes   Quit date: 03/17/2007   Years since quitting: 14.4   Passive exposure: Never  Smokeless Tobacco Never   Counseling given: Not Answered   Continue to not smoke  Outpatient Encounter Medications as of 09/10/2021  Medication Sig   amLODipine (NORVASC) 5 MG tablet Take 5 mg by mouth daily.   atorvastatin (LIPITOR) 20 MG tablet TAKE 1 TABLET BY MOUTH  DAILY AT 6 PM   cloNIDine (CATAPRES) 0.1 MG tablet TAKE 1 TABLET BY MOUTH  TWICE DAILY   fenofibrate 160 MG tablet TAKE 1 TABLET BY MOUTH  DAILY   fluticasone  (FLONASE) 50 MCG/ACT nasal spray Place 2 sprays into both nostrils daily.   multivitamin (ONE-A-DAY MEN'S) TABS Take 1 tablet by mouth daily.   Tiotropium Bromide-Olodaterol (STIOLTO RESPIMAT) 2.5-2.5 MCG/ACT AERS Inhale 2 puffs into the lungs daily.   No facility-administered encounter medications on file as of 09/10/2021.     Review of Systems  Review of Systems  N/a. Physical Exam  BP 114/68 (BP Location: Right Arm, Patient Position: Sitting, Cuff Size: Normal)   Pulse 79   Temp 97.9 F (36.6 C) (Oral)   Ht '5\' 8"'$  (1.727 m)   Wt 211 lb 6.4 oz (95.9 kg)   SpO2 98%   BMI 32.14 kg/m   Wt Readings from Last 5 Encounters:  09/10/21 211 lb 6.4 oz (95.9 kg)  07/23/21 214 lb 3.2 oz (97.2 kg)  06/10/21 219 lb 12.8 oz (99.7 kg)  02/28/21 220 lb (99.8 kg)  02/25/21 220 lb (99.8 kg)    BMI Readings from Last 5 Encounters:  09/10/21 32.14 kg/m  07/23/21 33.55 kg/m  06/10/21 34.43 kg/m  02/28/21 34.46 kg/m  02/25/21 34.46 kg/m     Physical Exam General: Well-appearing, no acute distress Eyes: EOMI, no icterus Neck: Supple, no JVD Cardiovascular: Regular rate and rhythm, no murmur Pulmonary: Clear, normal work  of breathing Abdomen: Nondistended, bowel sounds present MSK: No synovitis, no joint effusion Neuro: Normal gait, no weakness Psychiatry: Normal mood, full affect  Assessment & Plan:   Shortness of breath: Intermittent, stuttering.  Mostly difficulty occasional getting deep breath at rest.  Usually not with exertion..  Almost like a tightness.   Chest x-ray clear.  Echocardiogram normal.  PFTs with air trapping, otherwise normal.  Do suspect this is a normal response to intermittent atelectasis.  No significant abnormality on testing.  However given air trapping, trial dual bronchodilators with Stiolto.  If not beneficial, okay to discontinue and no further follow-up needed.   Return if symptoms worsen or fail to improve.   Lanier Clam,  MD 09/11/2021

## 2021-09-12 LAB — PULMONARY FUNCTION TEST
DL/VA % pred: 100 %
DL/VA: 4.56 ml/min/mmHg/L
DLCO cor % pred: 101 %
DLCO cor: 28.29 ml/min/mmHg
DLCO unc % pred: 101 %
DLCO unc: 28.29 ml/min/mmHg
FEF 25-75 Post: 4.24 L/sec
FEF 25-75 Pre: 3.77 L/sec
FEF2575-%Change-Post: 12 %
FEF2575-%Pred-Post: 125 %
FEF2575-%Pred-Pre: 111 %
FEV1-%Change-Post: 3 %
FEV1-%Pred-Post: 100 %
FEV1-%Pred-Pre: 97 %
FEV1-Post: 3.77 L
FEV1-Pre: 3.65 L
FEV1FVC-%Change-Post: 2 %
FEV1FVC-%Pred-Pre: 102 %
FEV6-%Change-Post: 2 %
FEV6-%Pred-Post: 99 %
FEV6-%Pred-Pre: 96 %
FEV6-Post: 4.57 L
FEV6-Pre: 4.48 L
FEV6FVC-%Change-Post: 0 %
FEV6FVC-%Pred-Post: 103 %
FEV6FVC-%Pred-Pre: 102 %
FVC-%Change-Post: 0 %
FVC-%Pred-Post: 95 %
FVC-%Pred-Pre: 95 %
FVC-Post: 4.58 L
FVC-Pre: 4.54 L
Post FEV1/FVC ratio: 82 %
Post FEV6/FVC ratio: 100 %
Pre FEV1/FVC ratio: 80 %
Pre FEV6/FVC Ratio: 99 %
RV % pred: 137 %
RV: 2.59 L
TLC % pred: 105 %
TLC: 6.94 L

## 2021-12-12 ENCOUNTER — Encounter: Payer: Self-pay | Admitting: Family Medicine

## 2021-12-12 ENCOUNTER — Ambulatory Visit (INDEPENDENT_AMBULATORY_CARE_PROVIDER_SITE_OTHER): Payer: 59 | Admitting: Family Medicine

## 2021-12-12 VITALS — BP 118/74 | HR 70 | Temp 98.2°F | Ht 68.0 in | Wt 209.0 lb

## 2021-12-12 DIAGNOSIS — Z1211 Encounter for screening for malignant neoplasm of colon: Secondary | ICD-10-CM | POA: Diagnosis not present

## 2021-12-12 DIAGNOSIS — E785 Hyperlipidemia, unspecified: Secondary | ICD-10-CM | POA: Diagnosis not present

## 2021-12-12 DIAGNOSIS — Z23 Encounter for immunization: Secondary | ICD-10-CM | POA: Diagnosis not present

## 2021-12-12 DIAGNOSIS — Z125 Encounter for screening for malignant neoplasm of prostate: Secondary | ICD-10-CM

## 2021-12-12 DIAGNOSIS — H9190 Unspecified hearing loss, unspecified ear: Secondary | ICD-10-CM

## 2021-12-12 DIAGNOSIS — Z0000 Encounter for general adult medical examination without abnormal findings: Secondary | ICD-10-CM | POA: Diagnosis not present

## 2021-12-12 LAB — CBC WITH DIFFERENTIAL/PLATELET
Basophils Absolute: 0 10*3/uL (ref 0.0–0.1)
Basophils Relative: 0.5 % (ref 0.0–3.0)
Eosinophils Absolute: 0.2 10*3/uL (ref 0.0–0.7)
Eosinophils Relative: 2.1 % (ref 0.0–5.0)
HCT: 41.8 % (ref 39.0–52.0)
Hemoglobin: 14.4 g/dL (ref 13.0–17.0)
Lymphocytes Relative: 24.5 % (ref 12.0–46.0)
Lymphs Abs: 1.8 10*3/uL (ref 0.7–4.0)
MCHC: 34.5 g/dL (ref 30.0–36.0)
MCV: 81.8 fl (ref 78.0–100.0)
Monocytes Absolute: 0.6 10*3/uL (ref 0.1–1.0)
Monocytes Relative: 8.5 % (ref 3.0–12.0)
Neutro Abs: 4.8 10*3/uL (ref 1.4–7.7)
Neutrophils Relative %: 64.4 % (ref 43.0–77.0)
Platelets: 240 10*3/uL (ref 150.0–400.0)
RBC: 5.11 Mil/uL (ref 4.22–5.81)
RDW: 13.2 % (ref 11.5–15.5)
WBC: 7.5 10*3/uL (ref 4.0–10.5)

## 2021-12-12 LAB — LIPID PANEL
Cholesterol: 145 mg/dL (ref 0–200)
HDL: 40.7 mg/dL (ref >39.00)
LDL Cholesterol: 73 mg/dL (ref 0–99)
NonHDL: 104.06
Total CHOL/HDL Ratio: 4
Triglycerides: 156 mg/dL — ABNORMAL HIGH (ref 0.0–149.0)
VLDL: 31.2 mg/dL (ref 0.0–40.0)

## 2021-12-12 LAB — COMPREHENSIVE METABOLIC PANEL
ALT: 34 U/L (ref 0–53)
AST: 26 U/L (ref 0–37)
Albumin: 4.8 g/dL (ref 3.5–5.2)
Alkaline Phosphatase: 45 U/L (ref 39–117)
BUN: 15 mg/dL (ref 6–23)
CO2: 27 mEq/L (ref 19–32)
Calcium: 9.7 mg/dL (ref 8.4–10.5)
Chloride: 103 mEq/L (ref 96–112)
Creatinine, Ser: 1.52 mg/dL — ABNORMAL HIGH (ref 0.40–1.50)
GFR: 53.63 mL/min — ABNORMAL LOW (ref >60.00)
Glucose, Bld: 106 mg/dL — ABNORMAL HIGH (ref 70–99)
Potassium: 4.4 mEq/L (ref 3.5–5.1)
Sodium: 139 mEq/L (ref 135–145)
Total Bilirubin: 0.9 mg/dL (ref 0.2–1.2)
Total Protein: 7.4 g/dL (ref 6.0–8.3)

## 2021-12-12 LAB — PSA: PSA: 3.16 ng/mL (ref 0.10–4.00)

## 2021-12-12 LAB — TSH: TSH: 2.85 u[IU]/mL (ref 0.35–5.50)

## 2021-12-12 MED ORDER — ATORVASTATIN CALCIUM 20 MG PO TABS: ORAL_TABLET | ORAL | 3 refills | Status: DC

## 2021-12-12 MED ORDER — FENOFIBRATE 160 MG PO TABS: 160.0000 mg | ORAL_TABLET | Freq: Every day | ORAL | 3 refills | Status: DC

## 2021-12-12 NOTE — Progress Notes (Signed)
   Subjective:    Patient ID: Dakota Reid, male    DOB: [birth year], 51 y.o.   MRN: [id number]  HPI CPE- UTD on Tdap.  Due for colon cancer screen.  Pt prefers cologuard.  Will get flu shot today.  Patient Care Team    Relationship Specialty Notifications Start End  Midge Minium, MD PCP - General   40/98/11   Jodi Marble, MD Consulting Physician Otolaryngology  11/01/14   Penni Bombard, MD Consulting Physician Neurology  11/01/14     Health Maintenance  Topic Date Due   COLONOSCOPY (Pts 45-59yr Insurance coverage will need to be confirmed)  Never done   INFLUENZA VACCINE  10/14/2021   COVID-19 Vaccine (3 - Moderna risk series) 12/28/2021 (Originally 08/15/2019)   TETANUS/TDAP  12/12/2030   Hepatitis C Screening  Completed   HIV Screening  Completed   HPV VACCINES  Aged Out      Review of Systems Patient reports no vision changes, anorexia, fever ,adenopathy, persistant/recurrent hoarseness, swallowing issues, chest pain, palpitations, edema, persistant/recurrent cough, hemoptysis, dyspnea (rest,exertional, paroxysmal nocturnal), gastrointestinal  bleeding (melena, rectal bleeding), abdominal pain, excessive heart burn, GU symptoms (dysuria, hematuria, voiding/incontinence issues) syncope, focal weakness, memory loss, numbness & tingling, skin/hair/nail changes, depression, anxiety, abnormal bruising/bleeding, musculoskeletal symptoms/signs.   + hearing loss    Objective:   Physical Exam General Appearance:    Alert, cooperative, no distress, appears stated age  Head:    Normocephalic, without obvious abnormality, atraumatic  Eyes:    PERRL, conjunctiva/corneas clear, EOM's intact both eyes       Ears:    Normal TM's and external ear canals, both ears  Nose:   Nares normal, septum midline, mucosa normal, no drainage   or sinus tenderness  Throat:   Lips, mucosa, and tongue normal; teeth and gums normal  Neck:   Supple, symmetrical, trachea midline, no adenopathy;        thyroid:  No enlargement/tenderness/nodules  Back:     Symmetric, no curvature, ROM normal, no CVA tenderness  Lungs:     Clear to auscultation bilaterally, respirations unlabored  Chest wall:    No tenderness or deformity  Heart:    Regular rate and rhythm, S1 and S2 normal, no murmur, rub   or gallop  Abdomen:     Soft, non-tender, bowel sounds active all four quadrants,    no masses, no organomegaly  Genitalia:    deferred  Rectal:    Extremities:   Extremities normal, atraumatic, no cyanosis or edema  Pulses:   2+ and symmetric all extremities  Skin:   Skin color, texture, turgor normal, no rashes or lesions  Lymph nodes:   Cervical, supraclavicular, and axillary nodes normal  Neurologic:   CNII-XII intact. Normal strength, sensation and reflexes      throughout          Assessment & Plan:

## 2021-12-12 NOTE — Patient Instructions (Addendum)
Follow up in 6 months to recheck cholesterol and blood pressure We'll notify you of your lab results and make any changes if needed Continue to work on healthy diet and regular exercise- you can do it! Complete the cologuard and return as directed We'll call you with your hearing appt Call with any questions or concerns Stay Safe!  Stay Healthy! Happy Fall!!!

## 2021-12-12 NOTE — Progress Notes (Signed)
Informed pt of lab results  

## 2021-12-12 NOTE — Assessment & Plan Note (Signed)
Pt's PE WNL w/ exception of BMI.  UTD on Tdap.  GI never returned pt's call for colonoscopy referral.  Will do cologuard instead.  Flu shot given.  Check labs.  Anticipatory guidance provided.

## 2021-12-12 NOTE — Assessment & Plan Note (Signed)
Chronic problem.  Tolerating statin w/o difficulty.  Check labs.  Adjust meds prn  

## 2021-12-24 ENCOUNTER — Other Ambulatory Visit: Payer: Self-pay | Admitting: Family Medicine

## 2021-12-24 LAB — COLOGUARD

## 2022-01-15 ENCOUNTER — Other Ambulatory Visit: Payer: Self-pay | Admitting: Family Medicine

## 2022-01-16 LAB — COLOGUARD: COLOGUARD: NEGATIVE

## 2022-01-16 NOTE — Progress Notes (Signed)
Called pt and informed him of his lab results and his cologuard results.

## 2022-01-31 IMAGING — CT CT ABD-PELV W/O CM
2 of 4 series · 14 of 46 positions shown, 16 images · non-contrast
Comparison: MRI abdomen without and with contrast 01/21/2021, CT
abdomen and pelvis no contrast 01/20/2021.

CLINICAL DATA: Right nephrectomy 4 days ago with postoperative
fever and abdominal swelling.

EXAM:
CT ABDOMEN AND PELVIS WITHOUT CONTRAST
TECHNIQUE: Multidetector CT imaging of the abdomen and pelvis was performed
following the standard protocol without IV contrast.

[Series 2: axial st · axial · 0.89mm/px · z∈[-679,-229]mm · 11 of 102 slices shown, 13 images]
[im 6/102  soft-tissue]
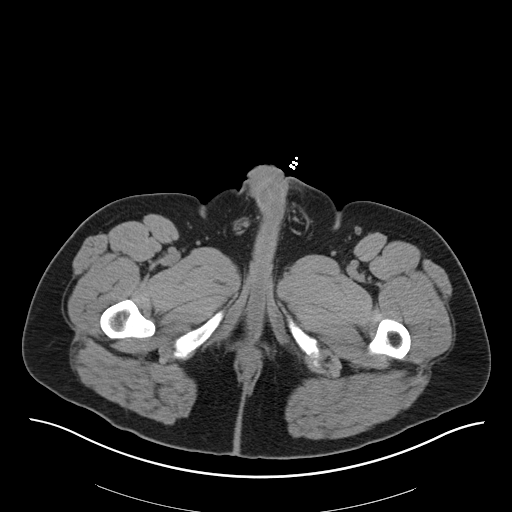
[im 6/102  bone]
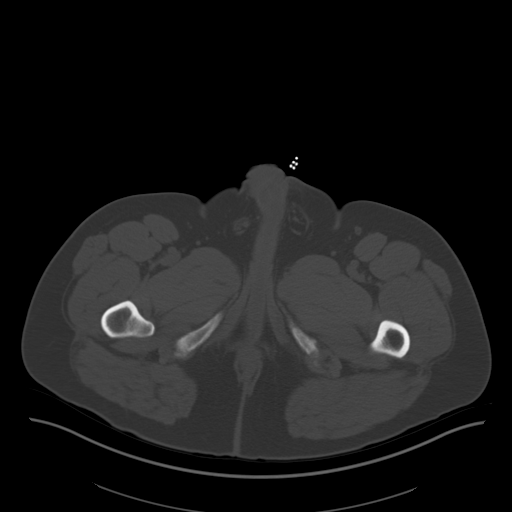
[im 18/102  soft-tissue]
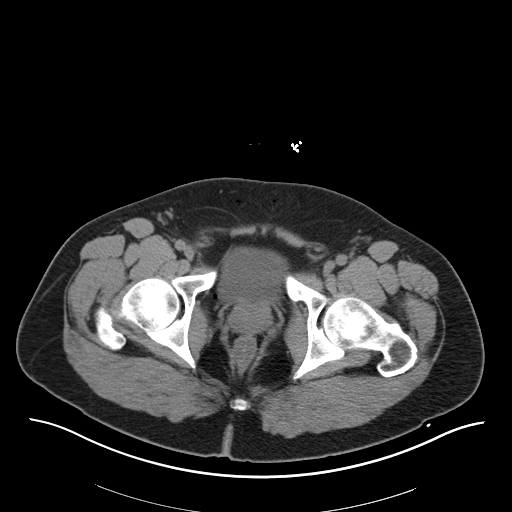
[im 24/102  soft-tissue]
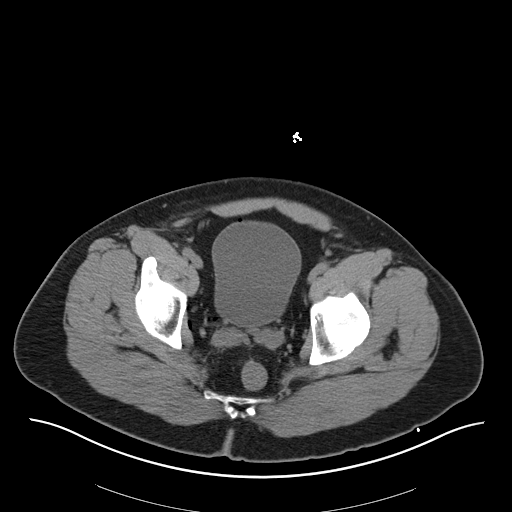
[im 36/102  soft-tissue]
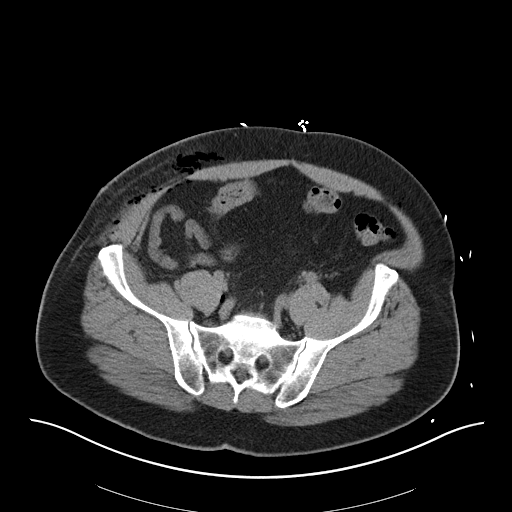
[im 42/102  soft-tissue]
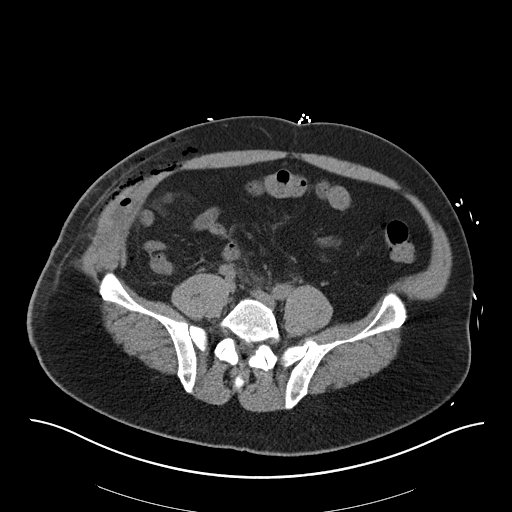
[im 54/102  soft-tissue]
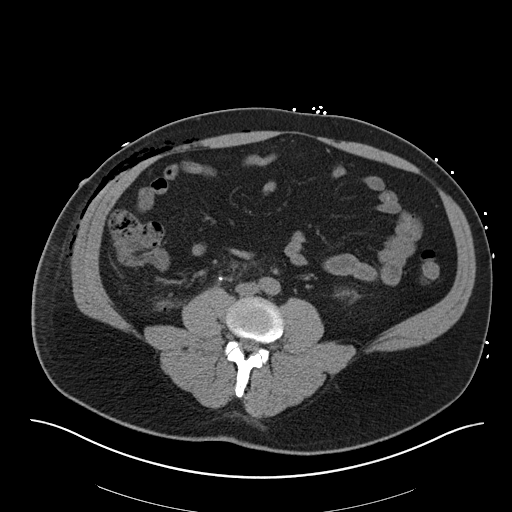
[im 60/102  soft-tissue]
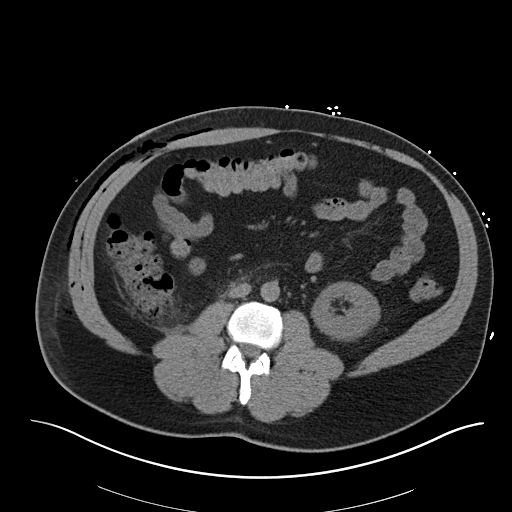
[im 66/102  soft-tissue]
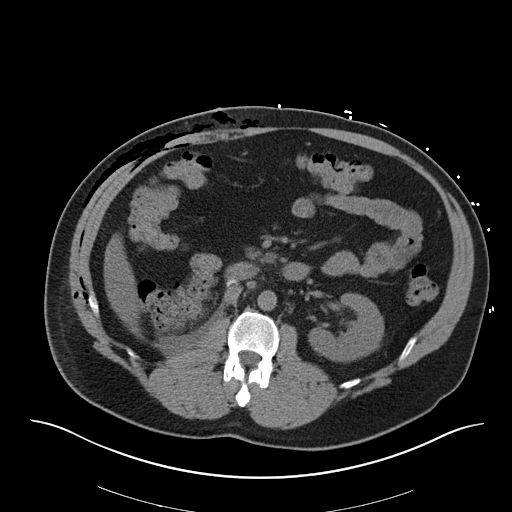
[im 78/102  soft-tissue]
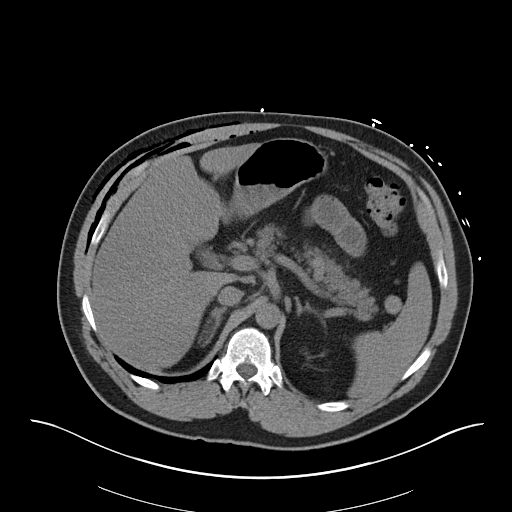
[im 78/102  bone]
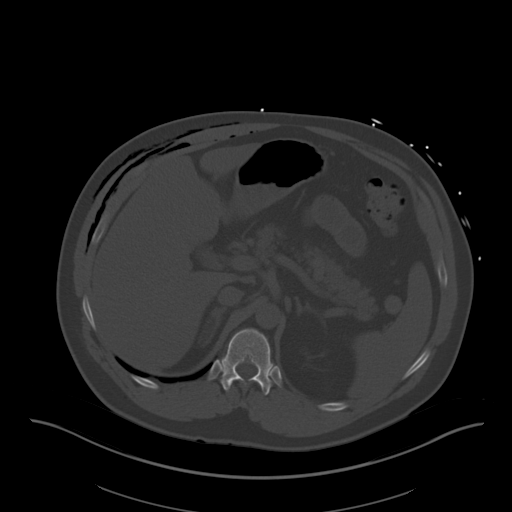
[im 84/102  soft-tissue]
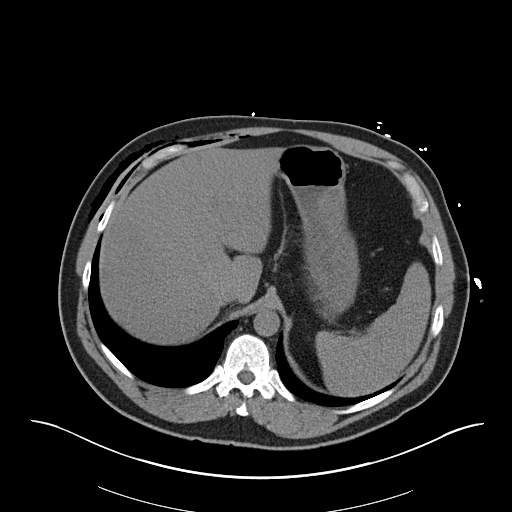
[im 96/102  soft-tissue]
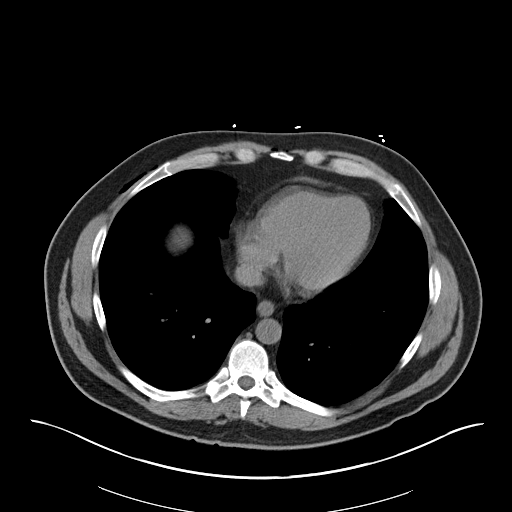

[Series 5: coronal st · coronal · 0.89mm/px · 3 of 179 slices shown]
[im 60/179  soft-tissue]
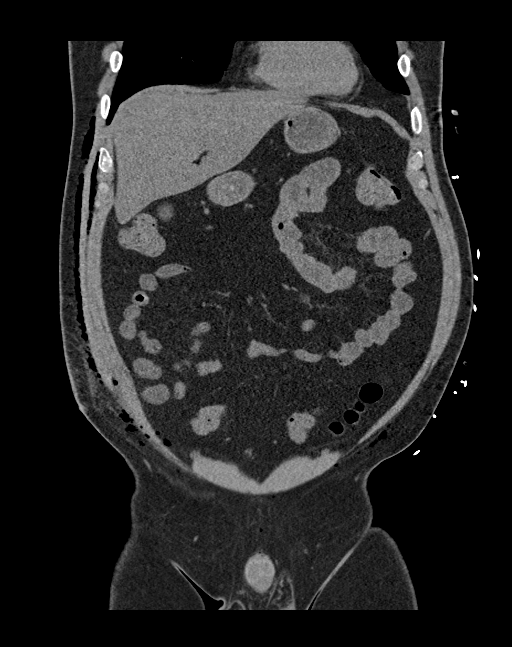
[im 80/179  soft-tissue]
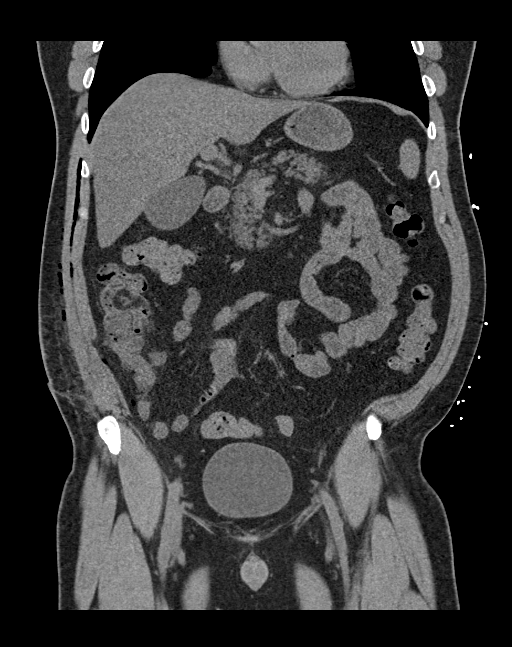
[im 99/179  soft-tissue]
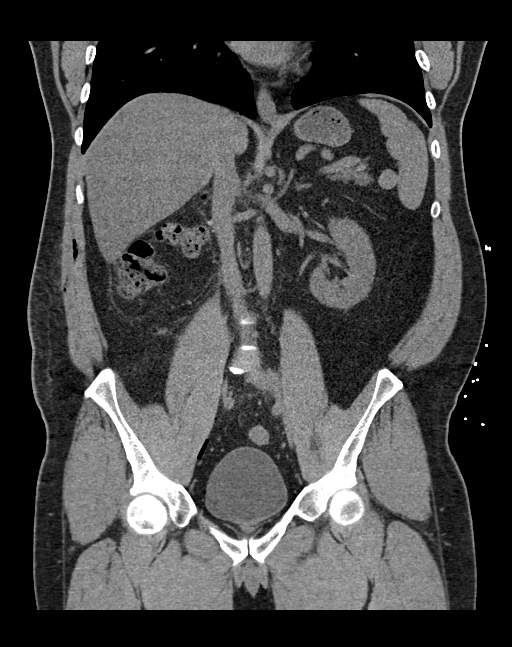

[14 of 46 positions shown; findings below may reference images not displayed]

FINDINGS: Lower chest: No acute process. The cardiac size is normal. A 3 mm
left lower lobe nodule is noted on axial image 9 of series 4. Given
the patient's high risk status with known renal carcinoma, a 1 year
follow-up chest CT is recommended.

Hepatobiliary: 17 cm in length mildly steatotic liver. No focal
abnormality without contrast.

Pancreas: No focal abnormality without contrast or ductal
dilatation.

Spleen: Mildly prominent measuring 14.5 cm AP, with splenules in the
splenic hilum. No focal abnormality without contrast.

Adrenals/Urinary Tract: No adrenal mass bilaterally. No focal
noncontrasted left renal abnormality. Interval right nephrectomy.

There is a thin walled low-density fluid collection posteriorly in
the right renal fossa measuring 6.3 x 2.7 x 5.5 cm and
Hounsfield units below the expected density of hematoma and most
likely representing a seroma.

An abscess is possible but the wall would usually be thicker and the
fluid would usually be more dense. There is mild stranding in the
right renal fossa most likely postoperative in etiology and
scattered air droplets above the fluid but not within the fluid
collection.

There is no evidence of urinary stones or obstruction. No bladder
thickening is seen. There is trace air in the anterior bladder.

Stomach/Bowel: No dilatation or wall thickening including the
appendix. Mild constipation. There are colonic diverticula without
evidence of colitis or diverticulitis.

Vascular/Lymphatic: Scattered trace aortic calcific plaques. No
lymphadenopathy is seen.

Reproductive: Normal prostate.

Other: There is patchy soft tissue gas in the subcutaneous plane in
the right abdominal wall, minimal scattered free intraperitoneal air
on the right including a small amount in the low anterior right
pelvis, and scattered air pockets in between the right abdominal
wall muscular layers.

There is no fluid in the pelvis. Right retroperitoneal induration is
seen which probably due to the recent surgery. There are small
umbilical and inguinal fat hernias.

Musculoskeletal: No concerning regional bone lesion.
IMPRESSION: 1. 6.3 x 2.7 by 5.5 cm thin walled low-density fluid collection in
the right renal fossa, below the typical density of hematoma and
probably representing a seroma or less likely abscess.
2. Trace free air in the right renal fossa, additional free
intraperitoneal air scattered along the liver and right abdominal
wall undersurface into the pelvis, likely postsurgical in etiology.
Correlate clinically for perforated bowel. Follow-up as indicated.
3. Additional patchy soft tissue gas subcutaneously in the right
abdominal wall and between the right abdominal wall muscular layers,
could be normal postsurgical change or infectious process but no
abscess is seen.
4. Minimal air in the bladder, could be due to recent
instrumentation or infectious process. No bladder thickening.
5. Mildly prominent liver and spleen are mild liver steatosis.
6. Constipation and diverticulosis.
7. 3 mm left lower lobe nodule. One year follow-up chest CT
recommended.

## 2022-02-22 ENCOUNTER — Other Ambulatory Visit: Payer: Self-pay | Admitting: Family Medicine

## 2022-05-26 IMAGING — DX DG CHEST 2V
2 series · 2 of 2 positions shown · non-contrast
Comparison: 03/01/2021

CLINICAL DATA: 50-year-old male with a history of kidney cancer

EXAM:
CHEST - 2 VIEW

[chest pa]
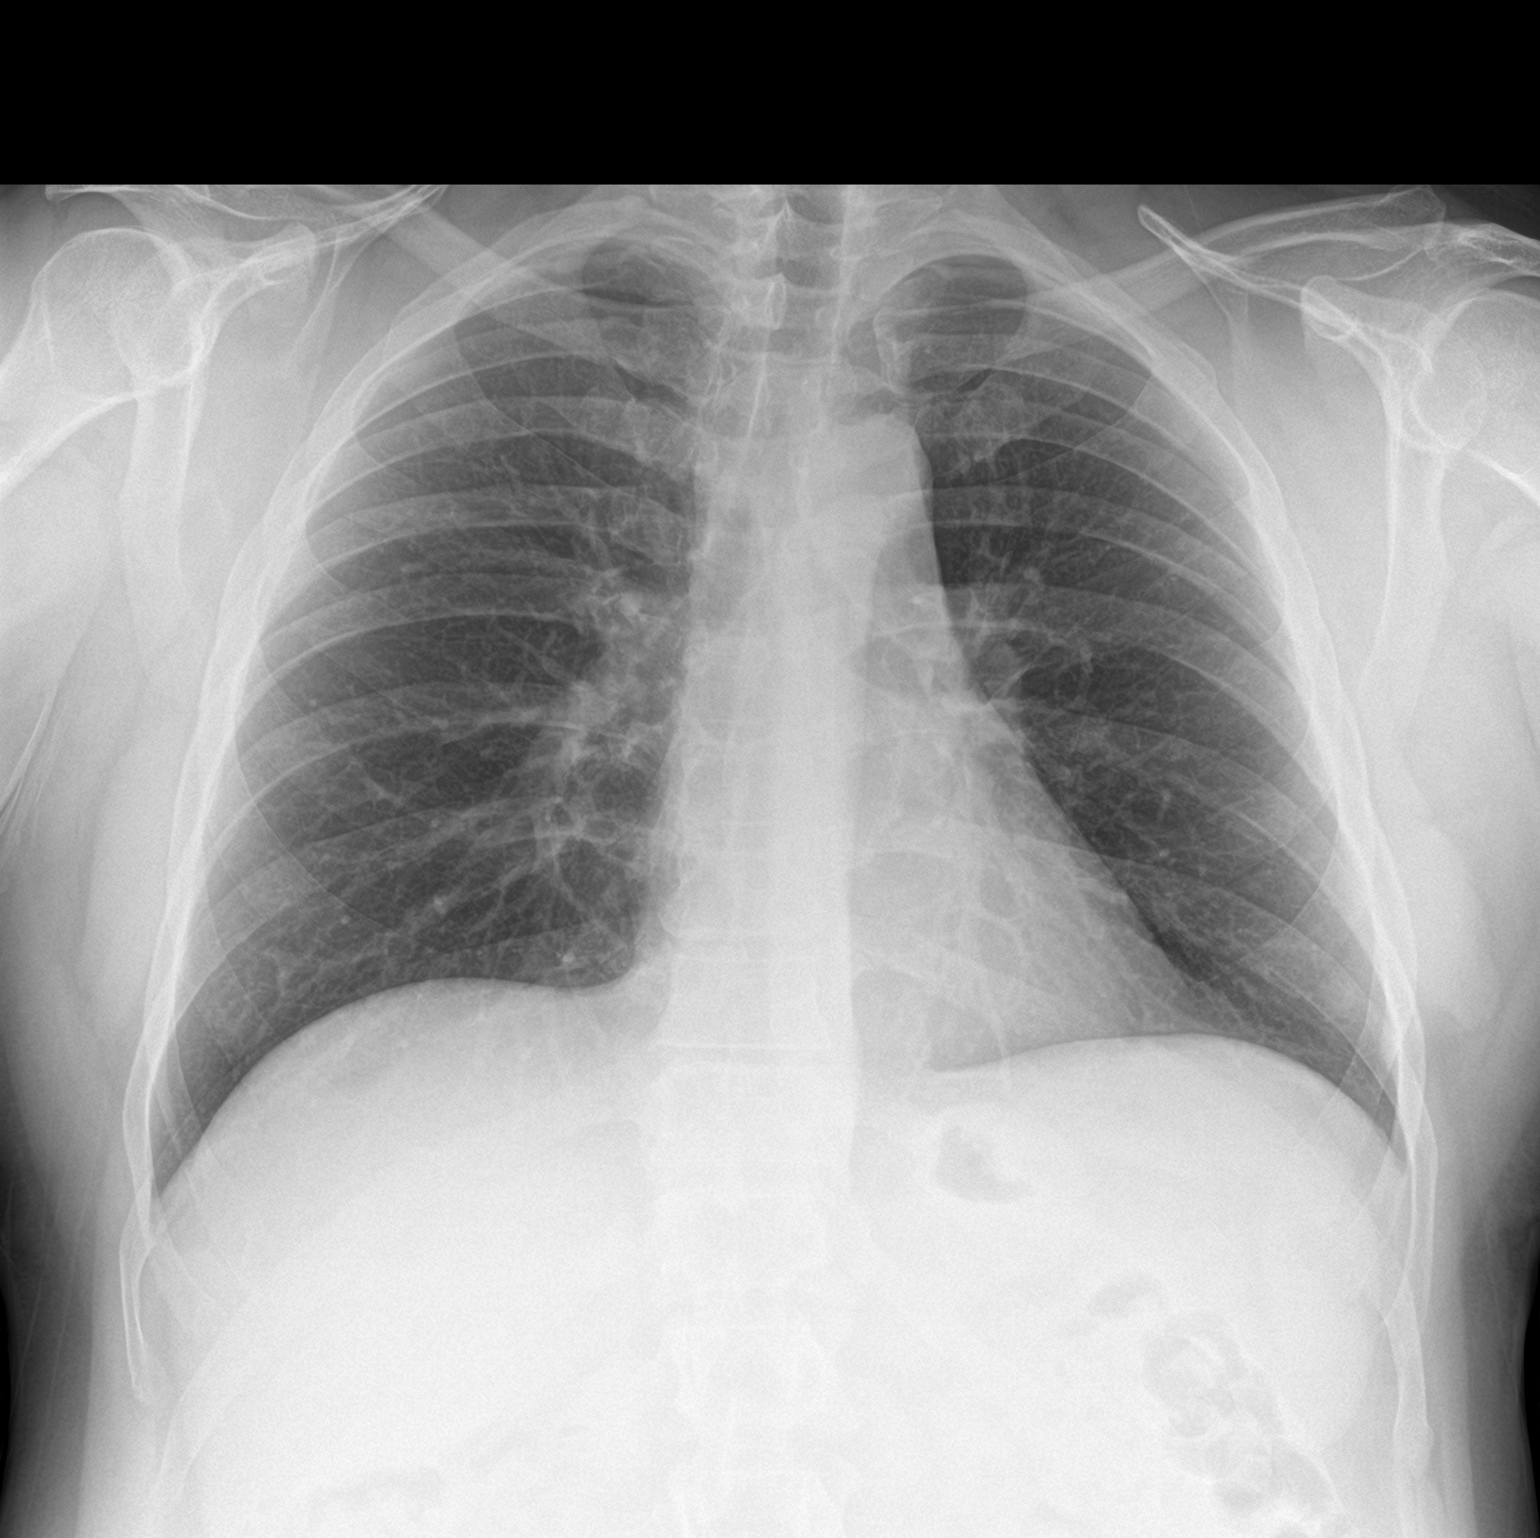

[chest lat]
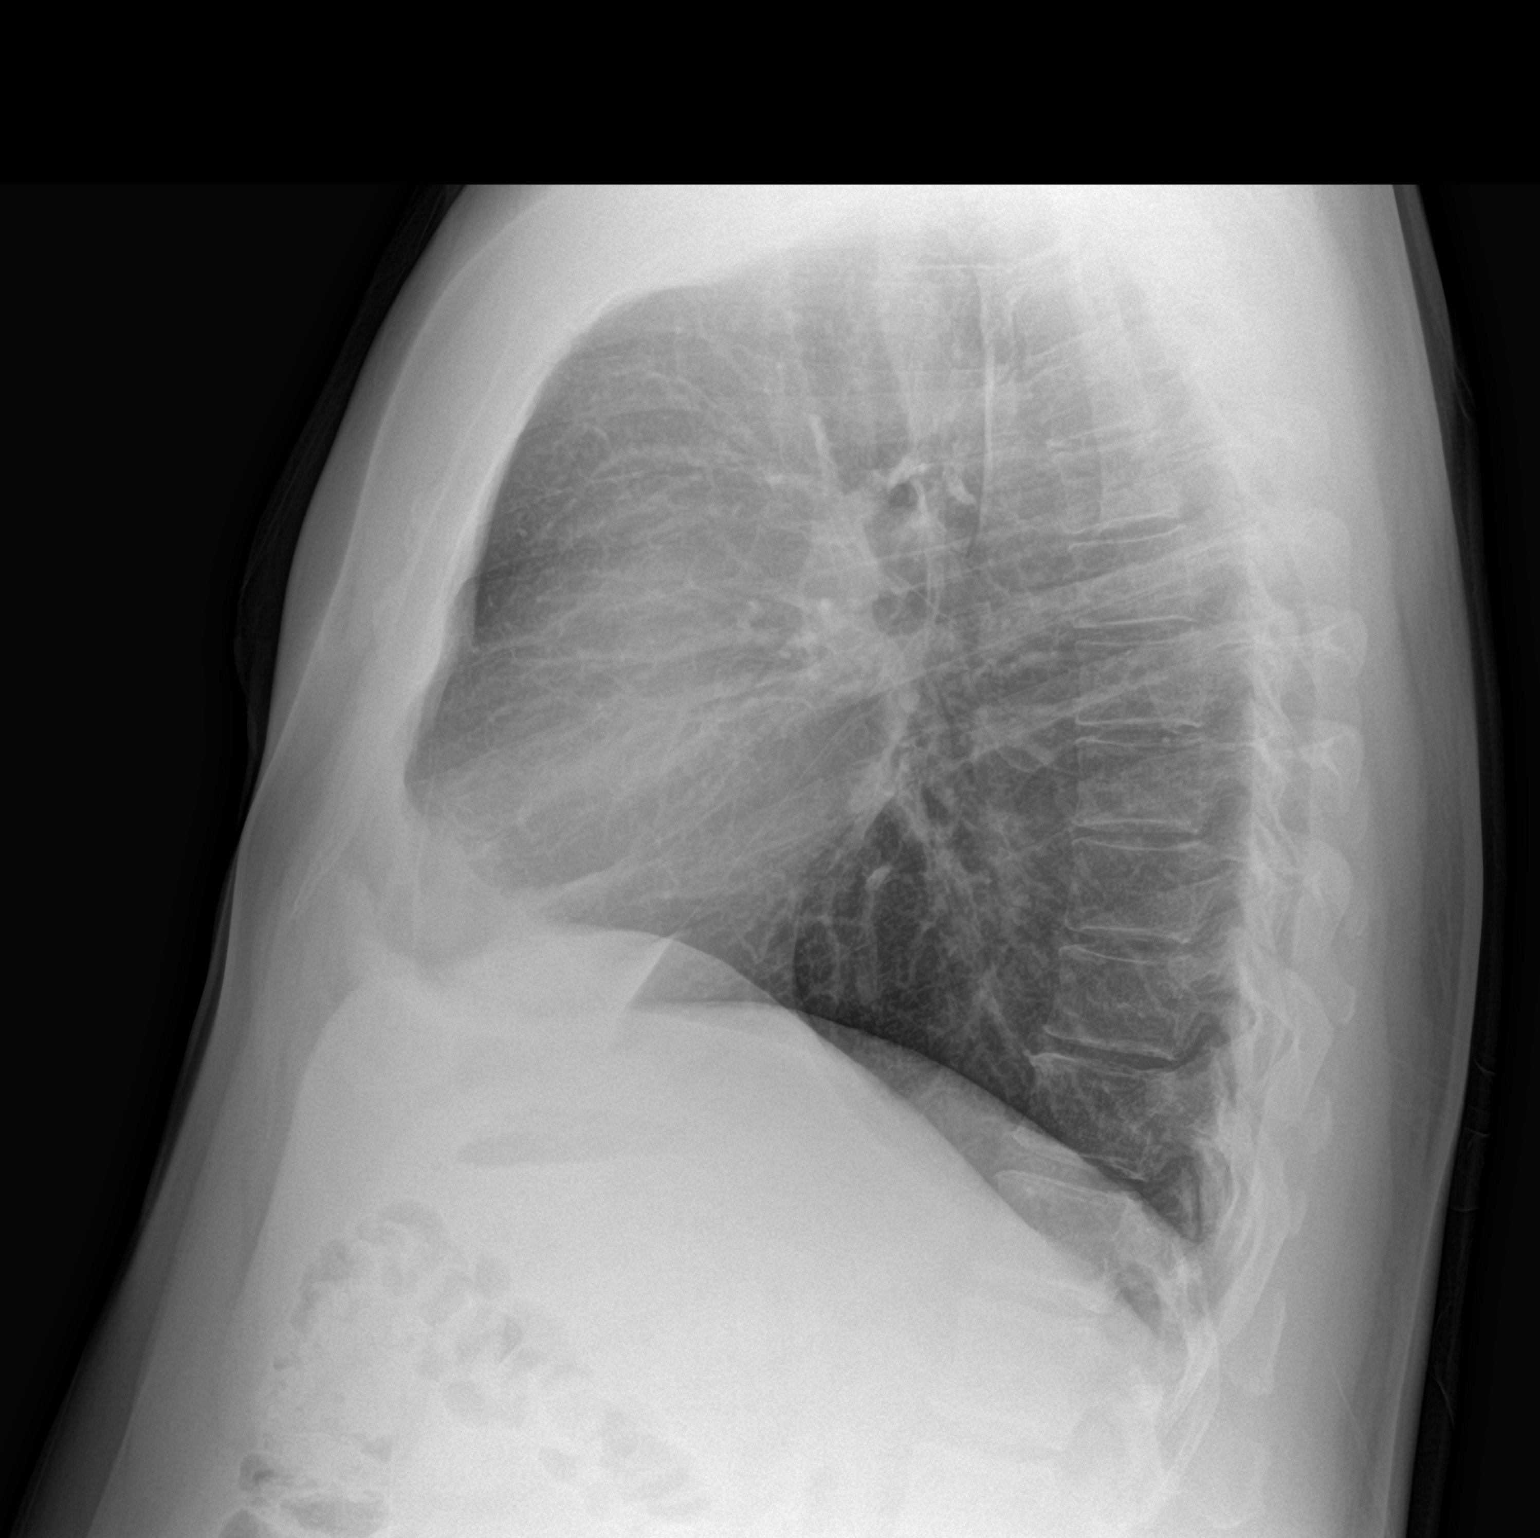

[2 of 2 positions shown; findings below may reference images not displayed]

FINDINGS: Cardiomediastinal silhouette unchanged in size and contour. No
evidence of central vascular congestion. No interlobular septal
thickening. No pneumothorax or pleural effusion. No confluent
airspace disease.

No acute displaced fracture
IMPRESSION: No active cardiopulmonary disease.

## 2022-06-15 ENCOUNTER — Ambulatory Visit: Payer: 59 | Admitting: Family Medicine

## 2022-07-09 ENCOUNTER — Encounter: Payer: Self-pay | Admitting: Family Medicine

## 2022-07-09 ENCOUNTER — Ambulatory Visit (INDEPENDENT_AMBULATORY_CARE_PROVIDER_SITE_OTHER): Payer: 59 | Admitting: Family Medicine

## 2022-07-09 VITALS — BP 124/72 | HR 79 | Temp 98.0°F | Resp 18 | Ht 68.0 in | Wt 215.5 lb

## 2022-07-09 DIAGNOSIS — E669 Obesity, unspecified: Secondary | ICD-10-CM | POA: Diagnosis not present

## 2022-07-09 DIAGNOSIS — E785 Hyperlipidemia, unspecified: Secondary | ICD-10-CM | POA: Diagnosis not present

## 2022-07-09 DIAGNOSIS — I10 Essential (primary) hypertension: Secondary | ICD-10-CM | POA: Diagnosis not present

## 2022-07-09 LAB — HEPATIC FUNCTION PANEL
ALT: 36 U/L (ref 0–53)
AST: 29 U/L (ref 0–37)
Albumin: 4.8 g/dL (ref 3.5–5.2)
Alkaline Phosphatase: 46 U/L (ref 39–117)
Bilirubin, Direct: 0.1 mg/dL (ref 0.0–0.3)
Total Bilirubin: 0.6 mg/dL (ref 0.2–1.2)
Total Protein: 7.7 g/dL (ref 6.0–8.3)

## 2022-07-09 LAB — CBC WITH DIFFERENTIAL/PLATELET
Basophils Absolute: 0 10*3/uL (ref 0.0–0.1)
Basophils Relative: 0.3 % (ref 0.0–3.0)
Eosinophils Absolute: 0.2 10*3/uL (ref 0.0–0.7)
Eosinophils Relative: 2.3 % (ref 0.0–5.0)
HCT: 42.2 % (ref 39.0–52.0)
Hemoglobin: 14.8 g/dL (ref 13.0–17.0)
Lymphocytes Relative: 32.5 % (ref 12.0–46.0)
Lymphs Abs: 2.1 10*3/uL (ref 0.7–4.0)
MCHC: 35.2 g/dL (ref 30.0–36.0)
MCV: 82 fl (ref 78.0–100.0)
Monocytes Absolute: 0.4 10*3/uL (ref 0.1–1.0)
Monocytes Relative: 6.6 % (ref 3.0–12.0)
Neutro Abs: 3.8 10*3/uL (ref 1.4–7.7)
Neutrophils Relative %: 58.3 % (ref 43.0–77.0)
Platelets: 250 10*3/uL (ref 150.0–400.0)
RBC: 5.14 Mil/uL (ref 4.22–5.81)
RDW: 13.3 % (ref 11.5–15.5)
WBC: 6.5 10*3/uL (ref 4.0–10.5)

## 2022-07-09 LAB — TSH: TSH: 2.24 u[IU]/mL (ref 0.35–5.50)

## 2022-07-09 LAB — LIPID PANEL
Cholesterol: 160 mg/dL (ref 0–200)
HDL: 44.5 mg/dL (ref >39.00)
NonHDL: 115.43
Total CHOL/HDL Ratio: 4
Triglycerides: 245 mg/dL — ABNORMAL HIGH (ref 0.0–149.0)
VLDL: 49 mg/dL — ABNORMAL HIGH (ref 0.0–40.0)

## 2022-07-09 LAB — LDL CHOLESTEROL, DIRECT: Direct LDL: 97 mg/dL

## 2022-07-09 NOTE — Patient Instructions (Signed)
Schedule your complete physical in 6 months We'll notify you of your lab results and make any changes if needed Continue to work on healthy diet and regular exercise- you can do it! Call with any questions or concerns Stay Safe!  Stay Healthy! Happy Spring!!! 

## 2022-07-09 NOTE — Progress Notes (Signed)
   Subjective:    Patient ID: Dakota Reid, male    DOB: 1970/10/27, 52 y.o.   MRN: [id number]  HPI HTN- chronic problem, on Amlodipine  daily and Clonidine 0.1mg  BID w/ good control.  No CP, SOB above baseline, HA's, visual changes, edema.  Hyperlipidemia- chronic problem, on Lipitor  daily and Fenofbirate  daily.  No abd pain, N/V.  Obesity- pt  has gained 6 lbs since last visit.  BMI now 32.77  no regular exercise, not following a particular diet   Review of Systems For ROS see HPI     Objective:   Physical Exam Vitals reviewed.  Constitutional:      General: He is not in acute distress.    Appearance: Normal appearance. He is well-developed. He is obese. He is not ill-appearing.  HENT:     Head: Normocephalic and atraumatic.  Eyes:     Extraocular Movements: Extraocular movements intact.     Conjunctiva/sclera: Conjunctivae normal.     Pupils: Pupils are equal, round, and reactive to light.  Neck:     Thyroid: No thyromegaly.  Cardiovascular:     Rate and Rhythm: Normal rate and regular rhythm.     Pulses: Normal pulses.     Heart sounds: Normal heart sounds. No murmur heard. Pulmonary:     Effort: Pulmonary effort is normal. No respiratory distress.     Breath sounds: Normal breath sounds.  Abdominal:     General: Bowel sounds are normal. There is no distension.     Palpations: Abdomen is soft.  Musculoskeletal:     Cervical back: Normal range of motion and neck supple.     Right lower leg: No edema.     Left lower leg: No edema.  Lymphadenopathy:     Cervical: No cervical adenopathy.  Skin:    General: Skin is warm and dry.  Neurological:     General: No focal deficit present.     Mental Status: He is alert and oriented to person, place, and time.     Cranial Nerves: No cranial nerve deficit.  Psychiatric:        Mood and Affect: Mood normal.        Behavior: Behavior normal.           Assessment & Plan:

## 2022-07-10 ENCOUNTER — Telehealth: Payer: Self-pay

## 2022-07-10 NOTE — Telephone Encounter (Signed)
-----   Message from Sheliah Hatch, MD sent at 07/10/2022  9:02 AM EDT ----- Labs look great!  No changes at this time

## 2022-07-10 NOTE — Telephone Encounter (Signed)
Pt seen results VIA my chart  

## 2022-07-14 NOTE — Assessment & Plan Note (Signed)
Deteriorated.  Pt has gained 6 lbs since last visit.  BMI now 32.77.  Admits to no regular exercise and not following a particular diet.  Encouraged low carb diet and regular physical activity.  Will continue to follow.

## 2022-07-14 NOTE — Assessment & Plan Note (Signed)
Chronic problem.  Currently on Lipitor 20mg  daily and Fenofibrate 160mg  daily.  Asymptomatic.  Check labs.  Adjust meds prn

## 2022-07-14 NOTE — Assessment & Plan Note (Signed)
Chronic problem.  Currently well controlled on Amlodipine 5mg  daily and Clonidine 0.1mg  BID.  Asymptomatic.  No med changes at this time.

## 2022-09-29 ENCOUNTER — Other Ambulatory Visit: Payer: Self-pay | Admitting: Pulmonary Disease

## 2022-11-15 ENCOUNTER — Other Ambulatory Visit: Payer: Self-pay | Admitting: Family Medicine

## 2022-12-21 ENCOUNTER — Ambulatory Visit (INDEPENDENT_AMBULATORY_CARE_PROVIDER_SITE_OTHER): Payer: 59 | Admitting: Family Medicine

## 2022-12-21 ENCOUNTER — Encounter: Payer: Self-pay | Admitting: Family Medicine

## 2022-12-21 VITALS — BP 128/88 | HR 82 | Temp 98.5°F | Ht 68.0 in | Wt 218.8 lb

## 2022-12-21 DIAGNOSIS — Z23 Encounter for immunization: Secondary | ICD-10-CM

## 2022-12-21 DIAGNOSIS — I10 Essential (primary) hypertension: Secondary | ICD-10-CM | POA: Diagnosis not present

## 2022-12-21 DIAGNOSIS — Z125 Encounter for screening for malignant neoplasm of prostate: Secondary | ICD-10-CM | POA: Diagnosis not present

## 2022-12-21 DIAGNOSIS — Z0000 Encounter for general adult medical examination without abnormal findings: Secondary | ICD-10-CM

## 2022-12-21 LAB — BASIC METABOLIC PANEL
BUN: 15 mg/dL (ref 6–23)
CO2: 28 meq/L (ref 19–32)
Calcium: 10.2 mg/dL (ref 8.4–10.5)
Chloride: 100 meq/L (ref 96–112)
Creatinine, Ser: 1.44 mg/dL (ref 0.40–1.50)
GFR: 56.82 mL/min — ABNORMAL LOW (ref >60.00)
Glucose, Bld: 91 mg/dL (ref 70–99)
Potassium: 4.5 meq/L (ref 3.5–5.1)
Sodium: 139 meq/L (ref 135–145)

## 2022-12-21 LAB — HEPATIC FUNCTION PANEL
ALT: 31 U/L (ref 0–53)
AST: 27 U/L (ref 0–37)
Albumin: 5.1 g/dL (ref 3.5–5.2)
Alkaline Phosphatase: 50 U/L (ref 39–117)
Bilirubin, Direct: 0.2 mg/dL (ref 0.0–0.3)
Total Bilirubin: 0.8 mg/dL (ref 0.2–1.2)
Total Protein: 7.8 g/dL (ref 6.0–8.3)

## 2022-12-21 LAB — LIPID PANEL
Cholesterol: 175 mg/dL (ref 0–200)
HDL: 45.9 mg/dL (ref >39.00)
LDL Cholesterol: 82 mg/dL (ref 0–99)
NonHDL: 128.97
Total CHOL/HDL Ratio: 4
Triglycerides: 233 mg/dL — ABNORMAL HIGH (ref 0.0–149.0)
VLDL: 46.6 mg/dL — ABNORMAL HIGH (ref 0.0–40.0)

## 2022-12-21 LAB — CBC WITH DIFFERENTIAL/PLATELET
Basophils Absolute: 0 10*3/uL (ref 0.0–0.1)
Basophils Relative: 0.6 % (ref 0.0–3.0)
Eosinophils Absolute: 0.2 10*3/uL (ref 0.0–0.7)
Eosinophils Relative: 2.2 % (ref 0.0–5.0)
HCT: 43.8 % (ref 39.0–52.0)
Hemoglobin: 15 g/dL (ref 13.0–17.0)
Lymphocytes Relative: 29.9 % (ref 12.0–46.0)
Lymphs Abs: 2.1 10*3/uL (ref 0.7–4.0)
MCHC: 34.2 g/dL (ref 30.0–36.0)
MCV: 82.2 fL (ref 78.0–100.0)
Monocytes Absolute: 0.6 10*3/uL (ref 0.1–1.0)
Monocytes Relative: 8 % (ref 3.0–12.0)
Neutro Abs: 4.2 10*3/uL (ref 1.4–7.7)
Neutrophils Relative %: 59.3 % (ref 43.0–77.0)
Platelets: 283 10*3/uL (ref 150.0–400.0)
RBC: 5.32 Mil/uL (ref 4.22–5.81)
RDW: 13.1 % (ref 11.5–15.5)
WBC: 7.1 10*3/uL (ref 4.0–10.5)

## 2022-12-21 LAB — PSA: PSA: 2.13 ng/mL (ref 0.10–4.00)

## 2022-12-21 LAB — TSH: TSH: 1.92 u[IU]/mL (ref 0.35–5.50)

## 2022-12-21 NOTE — Patient Instructions (Signed)
Follow up in 6 months to recheck blood pressure and cholesterol We'll notify you of your lab results and make any changes if needed Continue to work on healthy diet and regular exercise- you can do it! Monitor your blood pressure if you're having those hot moments or feel flushed.  If consistently higher than 135/90, let me know Call with any questions or concerns Stay Safe!  Stay Healthy! Happy Fall!!!

## 2022-12-21 NOTE — Assessment & Plan Note (Signed)
Pt's PE WNL w/ exception of BMI.  UTD on Tdap, cologuard.  Flu shot given today.  Check labs.  Anticipatory guidance provided.

## 2022-12-21 NOTE — Progress Notes (Signed)
Subjective:    Patient ID: Dakota Reid, male    DOB: Feb 21, 1971, 52 y.o.   MRN: [id number]  HPI CPE- UTD on cologuard, Tdap.  Flu shot today.  Patient Care Team    Relationship Specialty Notifications Start End  Sheliah Hatch, MD PCP - General   02/26/10   Flo Shanks, MD Consulting Physician Otolaryngology  11/01/14   Suanne Marker, MD Consulting Physician Neurology  11/01/14      Health Maintenance  Topic Date Due   Zoster Vaccines- Shingrix (1 of 2) Never done   Fecal DNA (Cologuard)  01/08/2025   DTaP/Tdap/Td (3 - Td or Tdap) 12/12/2030   INFLUENZA VACCINE  Completed   Hepatitis C Screening  Completed   HIV Screening  Completed   HPV VACCINES  Aged Out   COVID-19 Vaccine  Discontinued      Review of Systems Patient reports no vision/hearing changes, anorexia, fever ,adenopathy, persistant/recurrent hoarseness, swallowing issues, chest pain, palpitations, edema, persistant/recurrent cough, hemoptysis, dyspnea (rest,exertional, paroxysmal nocturnal), gastrointestinal  bleeding (melena, rectal bleeding), abdominal pain, excessive heart burn, GU symptoms (dysuria, hematuria, voiding/incontinence issues) syncope, focal weakness, memory loss, numbness & tingling, skin/hair/nail changes, depression, anxiety, abnormal bruising/bleeding, musculoskeletal symptoms/signs.     Objective:   Physical Exam General Appearance:    Alert, cooperative, no distress, appears stated age, obese  Head:    Normocephalic, without obvious abnormality, atraumatic  Eyes:    PERRL, conjunctiva/corneas clear, EOM's intact both eyes       Ears:    Normal TM's and external ear canals, both ears  Nose:   Nares normal, septum midline, mucosa normal, no drainage   or sinus tenderness  Throat:   Lips, mucosa, and tongue normal; teeth and gums normal  Neck:   Supple, symmetrical, trachea midline, no adenopathy;       thyroid:  No enlargement/tenderness/nodules  Back:     Symmetric, no  curvature, ROM normal, no CVA tenderness  Lungs:     Clear to auscultation bilaterally, respirations unlabored  Chest wall:    No tenderness or deformity  Heart:    Regular rate and rhythm, S1 and S2 normal, no murmur, rub   or gallop  Abdomen:     Soft, non-tender, bowel sounds active all four quadrants,    no masses, no organomegaly  Genitalia:    deferred  Rectal:    Extremities:   Extremities normal, atraumatic, no cyanosis or edema  Pulses:   2+ and symmetric all extremities  Skin:   Skin color, texture, turgor normal, no rashes or lesions  Lymph nodes:   Cervical, supraclavicular, and axillary nodes normal  Neurologic:   CNII-XII intact. Normal strength, sensation and reflexes      throughout          Assessment & Plan:

## 2022-12-22 NOTE — Progress Notes (Signed)
Patient viewed results via my chart. I sent a my chart message asking if he had any questions.

## 2023-01-10 ENCOUNTER — Other Ambulatory Visit: Payer: Self-pay | Admitting: Family Medicine

## 2023-01-18 ENCOUNTER — Encounter: Payer: Self-pay | Admitting: Family Medicine

## 2023-01-18 ENCOUNTER — Ambulatory Visit (INDEPENDENT_AMBULATORY_CARE_PROVIDER_SITE_OTHER): Payer: 59 | Admitting: Family Medicine

## 2023-01-18 VITALS — BP 130/82 | HR 71 | Temp 98.1°F | Ht 67.0 in | Wt 220.2 lb

## 2023-01-18 DIAGNOSIS — H66002 Acute suppurative otitis media without spontaneous rupture of ear drum, left ear: Secondary | ICD-10-CM

## 2023-01-18 MED ORDER — AMOXICILLIN 875 MG PO TABS: 875.0000 mg | ORAL_TABLET | Freq: Two times a day (BID) | ORAL | 0 refills | Status: AC

## 2023-01-18 MED ORDER — GUAIFENESIN-CODEINE 100-10 MG/5ML PO SYRP
10.0000 mL | ORAL_SOLUTION | Freq: Three times a day (TID) | ORAL | 0 refills | Status: AC | PRN
Start: 2023-01-18 — End: ?

## 2023-01-18 NOTE — Patient Instructions (Signed)
Follow up as needed or as scheduled START the Amoxicillin twice daily (w/ food) to treat that ear Drink LOTS of fluids Cough syrup as needed- may cause drowsiness Make sure you are taking a daily allergy medication and using your flonase Call with any questions or concerns Hang in there!!!

## 2023-01-18 NOTE — Progress Notes (Signed)
   Subjective:    Patient ID: Dakota Reid, male    DOB: 07/01/1970, 52 y.o.   MRN: [id number]  HPI Cough- sxs started 'with a tickle' at the base of his throat.  + PND.  Cough is dry- taking Nyquil and Mucinex at night.  Sxs started ~2 weeks ago.  No fevers, body aches, chills.  No HA.  Denies facial pain/pressure.  + L ear pain.     Review of Systems For ROS see HPI     Objective:   Physical Exam Vitals reviewed.  Constitutional:      General: He is not in acute distress.    Appearance: Normal appearance. He is not ill-appearing.  HENT:     Head: Normocephalic and atraumatic.     Right Ear: Tympanic membrane and ear canal normal.     Left Ear: A middle ear effusion is present. Tympanic membrane is erythematous.     Nose: Congestion present. No rhinorrhea.     Right Sinus: No maxillary sinus tenderness or frontal sinus tenderness.     Left Sinus: No maxillary sinus tenderness or frontal sinus tenderness.     Mouth/Throat:     Mouth: Mucous membranes are moist.     Pharynx: No oropharyngeal exudate or posterior oropharyngeal erythema.  Eyes:     Extraocular Movements: Extraocular movements intact.     Conjunctiva/sclera: Conjunctivae normal.  Pulmonary:     Effort: Pulmonary effort is normal. No respiratory distress.     Breath sounds: Normal breath sounds. No wheezing or rhonchi.  Musculoskeletal:     Cervical back: Neck supple.  Lymphadenopathy:     Cervical: No cervical adenopathy.  Skin:    General: Skin is warm and dry.  Neurological:     General: No focal deficit present.     Mental Status: He is alert and oriented to person, place, and time.  Psychiatric:        Mood and Affect: Mood normal.        Behavior: Behavior normal.        Thought Content: Thought content normal.           Assessment & Plan:  L OM- new.  Pt's L TM bulging and red.  + pain.  Will start Amoxicillin 875mg  BID x10 days.  No cough heard during visit.  Will provide cough syrup to help  him sleep at night.  Reviewed supportive care and red flags that should prompt return.  Pt expressed understanding and is in agreement w/ plan.

## 2023-01-29 ENCOUNTER — Encounter: Payer: Self-pay | Admitting: Family Medicine

## 2023-01-29 MED ORDER — PREDNISONE 10 MG PO TABS: ORAL_TABLET | ORAL | 0 refills | Status: DC

## 2023-01-29 NOTE — Addendum Note (Signed)
Addended by: Sheliah Hatch on: 01/29/2023 05:01 PM   Modules accepted: Orders

## 2023-01-29 NOTE — Telephone Encounter (Signed)
Pt notes continued cough but finished Abx already please advise

## 2023-02-09 ENCOUNTER — Other Ambulatory Visit: Payer: Self-pay | Admitting: Family Medicine

## 2023-03-25 ENCOUNTER — Encounter: Payer: Self-pay | Admitting: Anesthesiology

## 2023-03-29 ENCOUNTER — Telehealth: Payer: Self-pay | Admitting: Family Medicine

## 2023-03-29 NOTE — Telephone Encounter (Signed)
Form placed in folder at nurse station

## 2023-03-29 NOTE — Telephone Encounter (Signed)
 Who is calling: [employer] [facility]   Where are they calling from: [facility]   Callback number:[phone]  Fax: [phone]  What type of DME (Durable Medical Equipment) would you like your provider to order:    Heated Tubing Nasal Mask  Nasal Mask Cushion  Headgear  Chin Strap Disposable Filter Non-Disposable Filter Humidifier Chamber  Who and where does the order need to be sent:   [facility]   Comments:    Last visit with PCP (>3 months requires an appointment for insurance to cover the cost.  Please schedule patient for visit to discuss medical necessity for DME)    Placed in [employer] attached

## 2023-03-30 NOTE — Telephone Encounter (Signed)
 Faxed and placed in scan bin

## 2023-03-30 NOTE — Telephone Encounter (Signed)
Form signed and returned to [place]

## 2023-05-25 ENCOUNTER — Encounter: Payer: Self-pay | Admitting: Family Medicine

## 2023-05-26 ENCOUNTER — Other Ambulatory Visit: Payer: Self-pay | Admitting: Nurse Practitioner

## 2023-05-26 DIAGNOSIS — D2922 Benign neoplasm of left testis: Secondary | ICD-10-CM

## 2023-06-03 ENCOUNTER — Encounter: Payer: Self-pay | Admitting: Nurse Practitioner

## 2023-06-04 ENCOUNTER — Ambulatory Visit
Admission: RE | Admit: 2023-06-04 | Discharge: 2023-06-04 | Disposition: A | Discharge status: Home / Self Care | Source: Ambulatory Visit | Attending: Nurse Practitioner | Admitting: Nurse Practitioner

## 2023-06-04 DIAGNOSIS — D2922 Benign neoplasm of left testis: Secondary | ICD-10-CM

## 2023-06-21 ENCOUNTER — Ambulatory Visit: Payer: 59 | Admitting: Family Medicine

## 2023-06-24 ENCOUNTER — Ambulatory Visit: Admitting: Family Medicine

## 2023-06-24 VITALS — BP 118/72 | HR 88 | Temp 97.8°F | Ht 67.0 in | Wt 225.4 lb

## 2023-06-24 DIAGNOSIS — R0602 Shortness of breath: Secondary | ICD-10-CM

## 2023-06-24 DIAGNOSIS — I10 Essential (primary) hypertension: Secondary | ICD-10-CM | POA: Diagnosis not present

## 2023-06-24 DIAGNOSIS — Z23 Encounter for immunization: Secondary | ICD-10-CM

## 2023-06-24 DIAGNOSIS — E669 Obesity, unspecified: Secondary | ICD-10-CM | POA: Diagnosis not present

## 2023-06-24 DIAGNOSIS — E785 Hyperlipidemia, unspecified: Secondary | ICD-10-CM | POA: Diagnosis not present

## 2023-06-24 LAB — BASIC METABOLIC PANEL WITH GFR
BUN: 17 mg/dL (ref 6–23)
CO2: 29 meq/L (ref 19–32)
Calcium: 10.2 mg/dL (ref 8.4–10.5)
Chloride: 101 meq/L (ref 96–112)
Creatinine, Ser: 1.64 mg/dL — ABNORMAL HIGH (ref 0.40–1.50)
GFR: 48.44 mL/min — ABNORMAL LOW (ref >60.00)
Glucose, Bld: 109 mg/dL — ABNORMAL HIGH (ref 70–99)
Potassium: 4.4 meq/L (ref 3.5–5.1)
Sodium: 139 meq/L (ref 135–145)

## 2023-06-24 LAB — CBC WITH DIFFERENTIAL/PLATELET
Basophils Absolute: 0 10*3/uL (ref 0.0–0.1)
Basophils Relative: 0.5 % (ref 0.0–3.0)
Eosinophils Absolute: 0.2 10*3/uL (ref 0.0–0.7)
Eosinophils Relative: 2.4 % (ref 0.0–5.0)
HCT: 43.5 % (ref 39.0–52.0)
Hemoglobin: 15.1 g/dL (ref 13.0–17.0)
Lymphocytes Relative: 26.3 % (ref 12.0–46.0)
Lymphs Abs: 2.1 10*3/uL (ref 0.7–4.0)
MCHC: 34.8 g/dL (ref 30.0–36.0)
MCV: 82.8 fl (ref 78.0–100.0)
Monocytes Absolute: 0.7 10*3/uL (ref 0.1–1.0)
Monocytes Relative: 8.8 % (ref 3.0–12.0)
Neutro Abs: 5 10*3/uL (ref 1.4–7.7)
Neutrophils Relative %: 62 % (ref 43.0–77.0)
Platelets: 265 10*3/uL (ref 150.0–400.0)
RBC: 5.26 Mil/uL (ref 4.22–5.81)
RDW: 13.1 % (ref 11.5–15.5)
WBC: 8.1 10*3/uL (ref 4.0–10.5)

## 2023-06-24 LAB — HEPATIC FUNCTION PANEL
ALT: 35 U/L (ref 0–53)
AST: 28 U/L (ref 0–37)
Albumin: 5.2 g/dL (ref 3.5–5.2)
Alkaline Phosphatase: 52 U/L (ref 39–117)
Bilirubin, Direct: 0.2 mg/dL (ref 0.0–0.3)
Total Bilirubin: 0.7 mg/dL (ref 0.2–1.2)
Total Protein: 7.9 g/dL (ref 6.0–8.3)

## 2023-06-24 LAB — LIPID PANEL
Cholesterol: 157 mg/dL (ref 0–200)
HDL: 44.4 mg/dL (ref >39.00)
LDL Cholesterol: 84 mg/dL (ref 0–99)
NonHDL: 112.8
Total CHOL/HDL Ratio: 4
Triglycerides: 146 mg/dL (ref 0.0–149.0)
VLDL: 29.2 mg/dL (ref 0.0–40.0)

## 2023-06-24 LAB — TSH: TSH: 1.91 u[IU]/mL (ref 0.35–5.50)

## 2023-06-24 MED ORDER — STIOLTO RESPIMAT 2.5-2.5 MCG/ACT IN AERS: 2.0000 | INHALATION_SPRAY | Freq: Every day | RESPIRATORY_TRACT | 3 refills | Status: DC

## 2023-06-24 NOTE — Patient Instructions (Signed)
 Schedule your complete physical in 6 months We'll notify you of your lab results and make any changes if needed Continue to work on healthy diet and regular exercise- you can do it! RESTART the inhaler daily (if too expensive, let me know and we'll make changes) Call with any questions or concerns Stay Safe!  Stay Healthy! Happy Spring!!!

## 2023-06-24 NOTE — Assessment & Plan Note (Signed)
 Chronic problem.  Well controlled today on Amlodipine 5mg  daily.  No changes at this time.

## 2023-06-24 NOTE — Assessment & Plan Note (Signed)
 Deteriorated.  Pt has gained 7 lbs since last visit.  Admits to no regular exercise.  Not following particular diet.  Encouraged regular physical activity and healthy food choices.  Check labs.  Will follow.

## 2023-06-24 NOTE — Assessment & Plan Note (Signed)
 Pt saw Dr Judeth Horn in 2023 and was given a sample of Stiolto.  Pt reports his sxs have continued over the years and would like to restart the inhaler if possible to see if sxs improve.  Refill sent to pharmacy.

## 2023-06-24 NOTE — Assessment & Plan Note (Signed)
Chronic problem.  On Lipitor 20mg daily w/o difficulty.  Check labs.  Adjust meds prn  

## 2023-06-24 NOTE — Progress Notes (Signed)
   Subjective:    Patient ID: Dakota Reid, male    DOB: [birth year], 53 y.o.   MRN: [id number]  HPI Hyperlipidemia- chronic problem, on Lipitor 20mg  daily.  No abd pain, N/V.  HTN- chronic problem, on Amlodipine 5mg  daily w/ good control.  No CP, HA's, visual changes, edema.  Obesity- pt has gained 7 lbs since last visit.  No regular exercise.  Not following a particular diet  SOB- pt previously saw Pulmonary (2023) and was given inhaler samples.  At that time, was not having an issue but continues to have intermittent sxs.  Not having SOB per se, feels like he can't breathe fully or completely   Review of Systems For ROS see HPI     Objective:   Physical Exam Vitals reviewed.  Constitutional:      General: He is not in acute distress.    Appearance: Normal appearance. He is well-developed. He is not ill-appearing.  HENT:     Head: Normocephalic and atraumatic.  Eyes:     Extraocular Movements: Extraocular movements intact.     Conjunctiva/sclera: Conjunctivae normal.     Pupils: Pupils are equal, round, and reactive to light.  Neck:     Thyroid: No thyromegaly.  Cardiovascular:     Rate and Rhythm: Normal rate and regular rhythm.     Pulses: Normal pulses.     Heart sounds: Normal heart sounds. No murmur heard. Pulmonary:     Effort: Pulmonary effort is normal. No respiratory distress.     Breath sounds: Normal breath sounds.  Abdominal:     General: Bowel sounds are normal. There is no distension.     Palpations: Abdomen is soft.  Musculoskeletal:     Cervical back: Normal range of motion and neck supple.     Right lower leg: No edema.     Left lower leg: No edema.  Lymphadenopathy:     Cervical: No cervical adenopathy.  Skin:    General: Skin is warm and dry.  Neurological:     General: No focal deficit present.     Mental Status: He is alert and oriented to person, place, and time.     Cranial Nerves: No cranial nerve deficit.  Psychiatric:        Mood and  Affect: Mood normal.        Behavior: Behavior normal.           Assessment & Plan:

## 2023-06-25 ENCOUNTER — Encounter: Payer: Self-pay | Admitting: Family Medicine

## 2023-07-19 ENCOUNTER — Telehealth: Payer: Self-pay

## 2023-07-19 ENCOUNTER — Other Ambulatory Visit: Payer: Self-pay

## 2023-07-19 DIAGNOSIS — C649 Malignant neoplasm of unspecified kidney, except renal pelvis: Secondary | ICD-10-CM

## 2023-07-19 NOTE — Telephone Encounter (Signed)
 Copied from CRM [id number]. Topic: Clinical - Medical Advice >> Jul 19, 2023  1:42 PM Allyne Areola wrote: Reason for CRM: Patient got some information from [facility] and was informed that he has cancer. He is not sure how fast they're sending the notes to Dr.Tabori but wanted to inform her himself and wanted to see if Dr.Tabori thinks it's time to see an oncologist.

## 2023-07-19 NOTE — Telephone Encounter (Signed)
 I read the office note from late April in which they were concerned for a possible cancer recurrence and were going to run more tests.  I haven't seen anything since then.  I am so sorry to hear this!  I think it is always a good idea to have as much information as possible when deciding on next steps.  I am happy to refer you to oncology (dx kidney cancer) to learn what your options are if you would like to proceed.  Just let me know.  Sending prayers!

## 2023-07-20 ENCOUNTER — Other Ambulatory Visit (HOSPITAL_COMMUNITY): Payer: Self-pay | Admitting: Urology

## 2023-07-20 DIAGNOSIS — D497 Neoplasm of unspecified behavior of endocrine glands and other parts of nervous system: Secondary | ICD-10-CM

## 2023-07-20 DIAGNOSIS — R911 Solitary pulmonary nodule: Secondary | ICD-10-CM

## 2023-07-20 NOTE — Telephone Encounter (Signed)
 Spoke with pt and he is going to call [facility] and have them fax this information

## 2023-07-23 ENCOUNTER — Encounter (HOSPITAL_COMMUNITY)
Admission: RE | Admit: 2023-07-23 | Discharge: 2023-07-23 | Disposition: A | Discharge status: Home / Self Care | Source: Ambulatory Visit | Attending: Urology

## 2023-07-23 DIAGNOSIS — D497 Neoplasm of unspecified behavior of endocrine glands and other parts of nervous system: Secondary | ICD-10-CM | POA: Diagnosis present

## 2023-07-23 DIAGNOSIS — R911 Solitary pulmonary nodule: Secondary | ICD-10-CM | POA: Insufficient documentation

## 2023-07-23 LAB — GLUCOSE, CAPILLARY: Glucose-Capillary: 126 mg/dL — ABNORMAL HIGH (ref 70–99)

## 2023-07-23 MED ORDER — FLUDEOXYGLUCOSE F - 18 (FDG) INJECTION
11.2400 | Freq: Once | INTRAVENOUS | Status: AC
  Administered 2023-07-23: 11.24 via INTRAVENOUS

## 2023-07-28 ENCOUNTER — Other Ambulatory Visit (HOSPITAL_COMMUNITY): Payer: Self-pay | Admitting: Urology

## 2023-07-28 DIAGNOSIS — C641 Malignant neoplasm of right kidney, except renal pelvis: Secondary | ICD-10-CM

## 2023-07-28 DIAGNOSIS — D497 Neoplasm of unspecified behavior of endocrine glands and other parts of nervous system: Secondary | ICD-10-CM

## 2023-07-28 NOTE — Progress Notes (Signed)
 [name], MD  [name] Approved for attempted CT bx of LEFT adrenal nodule (CT 06/28/23 image 40 series 2).  Hx right RCC and NEW LEFT adrenal nodule.  It was not FDG+ on PET but remains suspicious given its new.  Should be approachable.  HKM       Previous Messages    ----- Message ----- From: Zaiah Credeur Sent: 07/28/2023   8:47 AM EDT To: Amandeep Hogston; Ir Procedure Requests Subject: CT Biopsy                                      Procedure : CT Biopsy  Reason: left adrenal nodule biopsy Dx: Adrenal neoplasm [D49.7 (ICD-10-CM)]; Renal cell carcinoma, right (HCC) [C64.1 (ICD-10-CM)]    History : NM PET Skull base to thigh , US  scrotum w/ doppler  Provider : Adelbert Homans, MD  Provider contact : [phone]

## 2023-08-24 ENCOUNTER — Ambulatory Visit

## 2023-08-24 ENCOUNTER — Encounter: Payer: Self-pay | Admitting: Family Medicine

## 2023-08-25 ENCOUNTER — Other Ambulatory Visit: Payer: Self-pay | Admitting: Student

## 2023-08-25 ENCOUNTER — Ambulatory Visit: Payer: Self-pay

## 2023-08-25 DIAGNOSIS — E279 Disorder of adrenal gland, unspecified: Secondary | ICD-10-CM

## 2023-08-25 DIAGNOSIS — Z01818 Encounter for other preprocedural examination: Secondary | ICD-10-CM

## 2023-08-25 MED ORDER — STIOLTO RESPIMAT 2.5-2.5 MCG/ACT IN AERS: 2.0000 | INHALATION_SPRAY | Freq: Every day | RESPIRATORY_TRACT | 3 refills | Status: AC

## 2023-08-25 NOTE — Telephone Encounter (Signed)
Okay to send to mail order 

## 2023-08-25 NOTE — H&P (Signed)
 Chief Complaint: Patient was seen in consultation today for left adrenal nodule  Referring Physician(s): Winter,Christopher Thurston Flow  Supervising Physician: Susan Ensign, MD  Patient Status: [facility] - Out-pt  History of Present Illness: Dakota Reid is a 53 y.o. male with past medical history of HLD, HTN, sleep apnea, and right renal cell carcinoma s/p right nephrectomy followed by Dr. Yevonne Heman recently found to have a left adrenal nodule on surveillance imaging.  He is referred to [facility] for left adrenal nodule biopsy.   Patient presents today for left adrenal mass biopsy. He has no concerns or complaints today.  Reports his usual state of health.  He is aware of procedure today and is agreeable to proceed.  He is NPO.  Wife is available for post procedure care and transportation.   Patient is FULL CODE.   Past Medical History:  Diagnosis Date   History of kidney stones    Hyperlipidemia    Hypertension    Sleep apnea    CPAP    Past Surgical History:  Procedure Laterality Date   NO PAST SURGERIES     ROBOT ASSISTED LAPAROSCOPIC NEPHRECTOMY Right 02/25/2021   Procedure: XI ROBOTIC ASSISTED LAPAROSCOPIC NEPHRECTOMY;  Surgeon: Adelbert Homans, MD;  Location: [facility] ORS;  Service: Urology;  Laterality: Right;   WISDOM TOOTH EXTRACTION      Allergies: Patient has no known allergies.  Medications: Prior to Admission medications   Medication Sig Start Date End Date Taking? Authorizing Provider  amLODipine (NORVASC) 5 MG tablet Take 5 mg by mouth daily.    [provider]  atorvastatin  (LIPITOR) 20 MG tablet TAKE 1 TABLET BY MOUTH DAILY AT  6 PM 01/11/23   Tabori, Katherine E, MD  cloNIDine  (CATAPRES ) 0.1 MG tablet TAKE 1 TABLET BY MOUTH TWICE  DAILY 02/10/23   Tabori, Katherine E, MD  fenofibrate  160 MG tablet TAKE 1 TABLET BY MOUTH DAILY 11/16/22   Tabori, Katherine E, MD  fluticasone  (FLONASE ) 50 MCG/ACT nasal spray Place 2 sprays into both  nostrils daily. 03/30/14   [provider]  guaiFENesin -codeine  (ROBITUSSIN AC) 100-10 MG/5ML syrup Take 10 mLs by mouth 3 (three) times daily as needed for cough. 01/18/23   Tabori, Katherine E, MD  multivitamin (ONE-A-DAY MEN'S) TABS Take 1 tablet by mouth daily.    [provider]  Tiotropium Bromide-Olodaterol (STIOLTO RESPIMAT ) 2.5-2.5 MCG/ACT AERS Inhale 2 puffs into the lungs daily. 06/24/23   Jess Morita, MD     Family History  Problem Relation Age of Onset   Hypertension Father    Kidney Stones Father    Benign prostatic hyperplasia Father    Diabetes Mother    Cancer Mother        cholangiocarcinoma   Prostate cancer Paternal Grandfather    Cancer Maternal Grandmother        colon   Colon cancer Maternal Grandmother    Lung cancer Maternal Grandmother     Social History   Socioeconomic History   Marital status: Married    Spouse name: Not on file   Number of children: 2   Years of education: BA   Highest education level: Bachelor's degree (e.g., BA, AB, BS)  Occupational History    Employer: [employer]    Comment: Insurance Company  Tobacco Use   Smoking status: Former    Current packs/day: 0.00    Types: Cigarettes    Quit date: 03/17/2007    Years since quitting: 16.4  Passive exposure: Never   Smokeless tobacco: Never  Vaping Use   Vaping status: Never Used  Substance and Sexual Activity   Alcohol use: Not Currently    Comment: 2-3 beers per week   Drug use: No   Sexual activity: Not Currently    Birth control/protection: None  Other Topics Concern   Not on file  Social History Narrative   Married, twin girls.   Patient lives at home with his family.   Caffeine Use:    Social Drivers of Corporate investment banker Strain: Low Risk  (06/24/2023)   Overall Financial Resource Strain (CARDIA)    Difficulty of Paying Living Expenses: Not very hard  Food Insecurity: No Food Insecurity (06/24/2023)   Hunger Vital Sign     Worried About Running Out of Food in the Last Year: Never true    Ran Out of Food in the Last Year: Never true  Transportation Needs: No Transportation Needs (06/24/2023)   PRAPARE - Administrator, Civil Service (Medical): No    Lack of Transportation (Non-Medical): No  Physical Activity: Insufficiently Active (06/24/2023)   Exercise Vital Sign    Days of Exercise per Week: 1 day    Minutes of Exercise per Session: 30 min  Stress: Stress Concern Present (06/24/2023)   [employer] of Occupational Health - Occupational Stress Questionnaire    Feeling of Stress : To some extent  Social Connections: Socially Integrated (06/24/2023)   Social Connection and Isolation Panel    Frequency of Communication with Friends and Family: More than three times a week    Frequency of Social Gatherings with Friends and Family: Once a week    Attends Religious Services: More than 4 times per year    Active Member of [employer] or Organizations: Yes    Attends Engineer, structural: More than 4 times per year    Marital Status: Married     Review of Systems: A 12 point ROS discussed and pertinent positives are indicated in the HPI above.  All other systems are negative.  Review of Systems  Constitutional:  Negative for fatigue and fever.  Respiratory:  Negative for cough and shortness of breath.   Cardiovascular:  Negative for chest pain.  Gastrointestinal:  Negative for abdominal pain, nausea and vomiting.  Musculoskeletal:  Negative for back pain.  Psychiatric/Behavioral:  Negative for behavioral problems and confusion.     Vital Signs: BP 124/83   Pulse 74   Temp 98.3 F (36.8 C) (Oral)   Resp 19   Ht 5' 7 (1.702 m)   Wt 220 lb (99.8 kg)   SpO2 95%   BMI 34.46 kg/m   Physical Exam Vitals and nursing note reviewed.  Constitutional:      General: He is not in acute distress.    Appearance: Normal appearance. He is not ill-appearing.  HENT:     Mouth/Throat:      Mouth: Mucous membranes are moist.     Pharynx: Oropharynx is clear.   Cardiovascular:     Rate and Rhythm: Normal rate and regular rhythm.  Pulmonary:     Effort: Pulmonary effort is normal. No respiratory distress.     Breath sounds: Normal breath sounds.  Abdominal:     General: Abdomen is flat. There is no distension.   Skin:    General: Skin is warm and dry.   Neurological:     General: No focal deficit present.     Mental Status:  He is alert and oriented to person, place, and time. Mental status is at baseline.   Psychiatric:        Mood and Affect: Mood normal.        Behavior: Behavior normal.        Thought Content: Thought content normal.        Judgment: Judgment normal.      MD Evaluation Airway: WNL Heart: WNL Abdomen: WNL Chest/ Lungs: WNL ASA  Classification: 3 Mallampati/Airway Score: Two   Imaging: No results found.  Labs:  CBC: Recent Labs    12/21/22 1318 06/24/23 0947 08/26/23 0700  WBC 7.1 8.1 6.4  HGB 15.0 15.1 13.9  HCT 43.8 43.5 39.9  PLT 283.0 265.0 222    COAGS: Recent Labs    08/26/23 0700  INR 1.1    BMP: Recent Labs    12/21/22 1318 06/24/23 0947  NA 139 139  K 4.5 4.4  CL 100 101  CO2 28 29  GLUCOSE 91 109*  BUN 15 17  CALCIUM  10.2 10.2  CREATININE 1.44 1.64*    LIVER FUNCTION TESTS: Recent Labs    12/21/22 1318 06/24/23 0947  BILITOT 0.8 0.7  AST 27 28  ALT 31 35  ALKPHOS 50 52  PROT 7.8 7.9  ALBUMIN 5.1 5.2    TUMOR MARKERS: No results for input(s): AFPTM, CEA, CA199, CHROMGRNA in the last 8760 hours.  Assessment and Plan: Patient with past medical history of right renal cell carcinoma s/p right nephrectomy presents with complaint of left adrenal nodule.  IR consulted for left adrenal mass nodule at the request of Dr. Sherrine Dolly. Case reviewed by Dr. Marne Sings who approves patient for procedure.  Patient presents today in their usual state of health.  He has been NPO and is not  currently on blood thinners.   Risks and benefits of biopsy was discussed with the patient and/or patient's family including, but not limited to bleeding, infection, damage to adjacent structures or low yield requiring additional tests.  All of the questions were answered and there is agreement to proceed.  Consent signed and in chart.   Thank you for this interesting consult.  I greatly enjoyed meeting Dakota Reid and look forward to participating in their care.  A copy of this report was sent to the requesting provider on this date.  Electronically Signed: Nyzier Boivin Sue-Ellen Belanna Manring, PA 08/26/2023, 7:34 AM   I spent a total of  30 Minutes   in face to face in clinical consultation, greater than 50% of which was counseling/coordinating care for left adrenal nodule.

## 2023-08-25 NOTE — Telephone Encounter (Signed)
 Appt made to see you on the 13th

## 2023-08-25 NOTE — Telephone Encounter (Signed)
 FYI Only or Action Required?: FYI only for provider  Patient was last seen in primary care on 06/24/2023 by Jess Morita, MD. Called Nurse Triage reporting Fatigue, Shortness of Breath, Hypertension, and Muscle Weakness. Symptoms began several months ago. Interventions attempted: Prescription medications: inhaler. Symptoms are: unchanged.  Triage Disposition: See PCP Within 2 Weeks  Patient/caregiver understands and will follow disposition?: Yes Copied from CRM [id number]. Topic: Clinical - Red Word Triage >> Aug 25, 2023  2:20 PM Melissa C wrote: Red Word that prompted transfer to Nurse Triage: patient has been experiencing shortness of breath, exhaustion, changes in blood pressure. Wanted to make an appointment with doctor to talk about possibility of Cushing's syndrome, but I asked that he speak with nurse first. Reason for Disposition  [1] MILD longstanding difficulty breathing AND [2]  SAME as normal  Answer Assessment - Initial Assessment Questions 1. RESPIRATORY STATUS: Describe your breathing? (e.g., wheezing, shortness of breath, unable to speak, severe coughing)      Shortness of breath 2. ONSET: When did this breathing problem begin?      Patient unable to say how long the shortness of breath 3. PATTERN Does the difficult breathing come and go, or has it been constant since it started?      constant 4. SEVERITY: How bad is your breathing? (e.g., mild, moderate, severe)    - MILD: No SOB at rest, mild SOB with walking, speaks normally in sentences, can lie down, no retractions, pulse < 100.    - MODERATE: SOB at rest, SOB with minimal exertion and prefers to sit, cannot lie down flat, speaks in phrases, mild retractions, audible wheezing, pulse 100-120.    - SEVERE: Very SOB at rest, speaks in single words, struggling to breathe, sitting hunched forward, retractions, pulse > 120      Mild 5. RECURRENT SYMPTOM: Have you had difficulty breathing before? If Yes, ask:  When was the last time? and What happened that time?      yes 6. CARDIAC HISTORY: Do you have any history of heart disease? (e.g., heart attack, angina, bypass surgery, angioplasty)      no 7. LUNG HISTORY: Do you have any history of lung disease?  (e.g., pulmonary embolus, asthma, emphysema)     no 8. CAUSE: What do you think is causing the breathing problem?      Patient states shortness of breath started after having his kidney removed 9. OTHER SYMPTOMS: Do you have any other symptoms? (e.g., dizziness, runny nose, cough, chest pain, fever)     Fatigue, high blood pressure 10. O2 SATURATION MONITOR:  Do you use an oxygen saturation monitor (pulse oximeter) at home? If Yes, ask: What is your reading (oxygen level) today? What is your usual oxygen saturation reading? (e.g., 95%)       95% 12. TRAVEL: Have you traveled out of the country in the last month? (e.g., travel history, exposures)       No  Patient is wanting to speak with PCP about possible Cushing Syndrome being related to the symptoms that patient has been having.  Protocols used: Breathing Difficulty-A-AH

## 2023-08-26 ENCOUNTER — Encounter (HOSPITAL_COMMUNITY): Payer: Self-pay

## 2023-08-26 ENCOUNTER — Other Ambulatory Visit: Payer: Self-pay

## 2023-08-26 ENCOUNTER — Ambulatory Visit (HOSPITAL_COMMUNITY)
Admission: RE | Admit: 2023-08-26 | Discharge: 2023-08-26 | Disposition: A | Discharge status: Home / Self Care | Source: Ambulatory Visit | Attending: Urology | Admitting: Urology

## 2023-08-26 DIAGNOSIS — E278 Other specified disorders of adrenal gland: Secondary | ICD-10-CM | POA: Insufficient documentation

## 2023-08-26 DIAGNOSIS — E279 Disorder of adrenal gland, unspecified: Secondary | ICD-10-CM

## 2023-08-26 DIAGNOSIS — C641 Malignant neoplasm of right kidney, except renal pelvis: Secondary | ICD-10-CM | POA: Diagnosis present

## 2023-08-26 DIAGNOSIS — Z905 Acquired absence of kidney: Secondary | ICD-10-CM | POA: Insufficient documentation

## 2023-08-26 DIAGNOSIS — Z01818 Encounter for other preprocedural examination: Secondary | ICD-10-CM

## 2023-08-26 DIAGNOSIS — E785 Hyperlipidemia, unspecified: Secondary | ICD-10-CM | POA: Insufficient documentation

## 2023-08-26 DIAGNOSIS — Z85528 Personal history of other malignant neoplasm of kidney: Secondary | ICD-10-CM | POA: Diagnosis not present

## 2023-08-26 DIAGNOSIS — G4730 Sleep apnea, unspecified: Secondary | ICD-10-CM | POA: Insufficient documentation

## 2023-08-26 DIAGNOSIS — I10 Essential (primary) hypertension: Secondary | ICD-10-CM | POA: Diagnosis not present

## 2023-08-26 DIAGNOSIS — D497 Neoplasm of unspecified behavior of endocrine glands and other parts of nervous system: Secondary | ICD-10-CM | POA: Diagnosis present

## 2023-08-26 LAB — PROTIME-INR
INR: 1.1 (ref 0.8–1.2)
Prothrombin Time: 14.3 s (ref 11.4–15.2)

## 2023-08-26 LAB — CBC
HCT: 39.9 % (ref 39.0–52.0)
Hemoglobin: 13.9 g/dL (ref 13.0–17.0)
MCH: 28 pg (ref 26.0–34.0)
MCHC: 34.8 g/dL (ref 30.0–36.0)
MCV: 80.4 fL (ref 80.0–100.0)
Platelets: 222 10*3/uL (ref 150–400)
RBC: 4.96 MIL/uL (ref 4.22–5.81)
RDW: 12.3 % (ref 11.5–15.5)
WBC: 6.4 10*3/uL (ref 4.0–10.5)
nRBC: 0 % (ref 0.0–0.2)

## 2023-08-26 MED ORDER — FENTANYL CITRATE (PF) 100 MCG/2ML IJ SOLN
INTRAMUSCULAR | Status: AC
Start: 2023-08-26 — End: 2023-08-26
  Filled 2023-08-26: qty 2

## 2023-08-26 MED ORDER — LIDOCAINE HCL 1 % IJ SOLN
10.0000 mL | Freq: Once | INTRAMUSCULAR | Status: AC
Start: 2023-08-26 — End: 2023-08-26
  Administered 2023-08-26: 10 mL via INTRADERMAL

## 2023-08-26 MED ORDER — FENTANYL CITRATE (PF) 100 MCG/2ML IJ SOLN
INTRAMUSCULAR | Status: AC | PRN
  Administered 2023-08-26 (×2): 25 ug via INTRAVENOUS
  Administered 2023-08-26: 50 ug via INTRAVENOUS

## 2023-08-26 MED ORDER — MIDAZOLAM HCL 2 MG/2ML IJ SOLN
INTRAMUSCULAR | Status: AC
  Filled 2023-08-26: qty 2

## 2023-08-26 MED ORDER — MIDAZOLAM HCL 2 MG/2ML IJ SOLN
INTRAMUSCULAR | Status: AC | PRN
  Administered 2023-08-26: 1 mg via INTRAVENOUS
  Administered 2023-08-26 (×2): .5 mg via INTRAVENOUS

## 2023-08-26 NOTE — Procedures (Signed)
 Pre procedural Dx: Left adrenal mass  Post procedural Dx: Same  Technically successful CT guided biopsy of left adrenal mass   EBL: None.   Complications: None immediate.   Zettie Hillock, MD Pager #: [phone]

## 2023-08-27 ENCOUNTER — Ambulatory Visit (INDEPENDENT_AMBULATORY_CARE_PROVIDER_SITE_OTHER): Admitting: Family Medicine

## 2023-08-27 VITALS — BP 122/84 | HR 86 | Temp 98.7°F | Ht 67.0 in | Wt 231.0 lb

## 2023-08-27 DIAGNOSIS — M6281 Muscle weakness (generalized): Secondary | ICD-10-CM

## 2023-08-27 DIAGNOSIS — R233 Spontaneous ecchymoses: Secondary | ICD-10-CM

## 2023-08-27 DIAGNOSIS — R0602 Shortness of breath: Secondary | ICD-10-CM | POA: Diagnosis not present

## 2023-08-27 DIAGNOSIS — Z23 Encounter for immunization: Secondary | ICD-10-CM

## 2023-08-27 DIAGNOSIS — L906 Striae atrophicae: Secondary | ICD-10-CM | POA: Diagnosis not present

## 2023-08-27 LAB — SURGICAL PATHOLOGY

## 2023-08-27 NOTE — Patient Instructions (Signed)
 Follow up as needed or as scheduled Collect and return the 24 hr urine as directed I'll figure out the next steps based on your symptoms and reach out early next week Call with any questions or concerns CONGRATS ON THE GOOD NEWS!!!

## 2023-08-27 NOTE — Progress Notes (Unsigned)
   Subjective:    Patient ID: Dakota Reid, male    DOB: June 22, 1970, 53 y.o.   MRN: [id number]  HPI SOB- pt was referred to pulmonary previously.  Was started on Stiolto Respimat  but this was not helpful.  Pt feels that breathing issue is mechanical and that he is having tightness/weakness of diaphragm intercostal muscles.  Pt is wondering if he has Cushing's based on his constellation of sxs- muscle weakness, weight gain, high trigs, elevated BP, stretch marks, prolonged bruising.     Review of Systems For ROS see HPI     Objective:   Physical Exam Vitals reviewed.  Constitutional:      General: He is not in acute distress.    Appearance: He is well-developed. He is obese. He is not ill-appearing.  HENT:     Head: Normocephalic and atraumatic.   Eyes:     Extraocular Movements: Extraocular movements intact.     Pupils: Pupils are equal, round, and reactive to light.    Cardiovascular:     Rate and Rhythm: Normal rate and regular rhythm.  Pulmonary:     Effort: Pulmonary effort is normal.     Breath sounds: Normal breath sounds.  Abdominal:     Palpations: Abdomen is soft.     Tenderness: There is no abdominal tenderness. There is no guarding or rebound.   Musculoskeletal:     Cervical back: Neck supple.  Lymphadenopathy:     Cervical: No cervical adenopathy.   Skin:    General: Skin is warm and dry.     Comments: Abdominal striae   Neurological:     General: No focal deficit present.     Mental Status: He is alert.   Psychiatric:        Mood and Affect: Mood normal.        Behavior: Behavior normal.           Assessment & Plan:  SOB- deteriorated.  Pt saw Pulmonary and was started on Stiolto but this has not improved sxs.  He felt that the visit was not helpful.  Given his associated sxs- weight gain, striae, muscle weakness, and abnormal bruising- he is concerned for possible Cushing's.  Will get 24 urine cortisol.  Given his description that it feels like  a mechanical/muscular cause of SOB, it sounds as if he needs a neuromuscular workup.  Uncertain if this would be done via Neuro or PM&R but will discuss w/ some colleagues and make appropriate referral.  Pt expressed understanding and is in agreement w/ plan.

## 2023-08-28 ENCOUNTER — Encounter: Payer: Self-pay | Admitting: Family Medicine

## 2023-08-31 NOTE — Progress Notes (Unsigned)
 [facility] CONSULT NOTE  Patient Care Team: Jess Morita, MD as PCP - General Lenton Rail, MD as Consulting Physician (Otolaryngology) Omega Bible, MD as Consulting Physician (Neurology)  ASSESSMENT & PLAN:  Dakota Reid is a 53 y.o.male with history of pT1b 4.2 cm G2 Right clear cell RCC status post right nephrectomy in 02/2021 being [facility] for history of renal cell carcinoma and new left adrenal nodule.  Clinical he has no persistent headache, sweats, and tachycardia. He has no hypertension with hypokalemia from history. Clinically concerning for Cushing syndrome.  Assessment & Plan   No orders of the defined types were placed in this encounter.   The total time spent in the appointment was {CHL ONC TIME VISIT - ZOXWR:[id number]} encounter with patients including review of chart and various tests results, discussions about plan of care and coordination of care plan   All questions were answered. The patient knows to call the clinic with any problems, questions or concerns. No barriers to learning was detected.  Lowanda Ruddy, MD 6/17/20259:42 AM  CHIEF COMPLAINTS/PURPOSE OF CONSULTATION:  ***  HISTORY OF PRESENTING ILLNESS:  Dakota Reid 53 y.o. male is here because of history of renal cell carcinoma and new left adrenal nodule. I have reviewed his chart and materials related to his cancer extensively and collaborated history with the patient. Summary of oncologic history is as follows:  History reported patient has history of right-sided renal cell carcinoma status post right nephrectomy on 02/25/2022.  Final pathology showed pT1b 4.2 cm G2 clear-cell carcinoma and all margins were negative.  Patient continued surveillance and CT from 06/30/23 found new small left adrenal nodule 13 mm, new from 07/02/2022.    Patient had a follow-up PET/CT on 07/23/2023.  The new left adrenal nodule was not PET avid.  A small right lower lobe  lung nodules reported not FDG avid.  Signs and symptoms associated with hypersecretion of cortisol or Cushing syndrome:  weight gain, weakness (primarily in proximal muscles), hypertension, psychiatric disturbances, hirsutism, centripetal obesity, purple striae, dorsocervical fat pad and supraclavicular fat pad enlargement, hyperglycemia, and hypokalemia.   Aldosterone-secreting tumors may present with hypertension, weakness, and hypokalemia   Oncology History  Clear cell carcinoma of right kidney (HCC)  01/21/2021 Initial Diagnosis   Clear cell carcinoma of right kidney (HCC)  MRI ABD Adrenals/Urinary Tract:  Bilateral adrenal glands are unremarkable.   Heterogeneously enhancing 4.1 x 3.4 cm RIGHT lower pole renal lesion on image 72/17, the lesion is partially exophytic but also extends within the lower pole renal sinus.   03/01/2021 Imaging   Postop CT  1. 6.3 x 2.7 by 5.5 cm thin walled low-density fluid collection in the right renal fossa, below the typical density of hematoma and probably representing a seroma or less likely abscess. 2. Trace free air in the right renal fossa, additional free intraperitoneal air scattered along the liver and right abdominal wall undersurface into the pelvis, likely postsurgical in etiology. Correlate clinically for perforated bowel. Follow-up as indicated. 3. Additional patchy soft tissue gas subcutaneously in the right abdominal wall and between the right abdominal wall muscular layers, could be normal postsurgical change or infectious process but no abscess is seen. 4. Minimal air in the bladder, could be due to recent instrumentation or infectious process. No bladder thickening. 5. Mildly prominent liver and spleen are mild liver steatosis. 6. Constipation and diverticulosis. 7. 3 mm left lower lobe nodule. One year follow-up chest CT recommended.  02/25/2022 Pathology Results   KIDNEY, RIGHT, NEPHRECTOMY:  Clear cell renal cell  carcinoma, nuclear grade 2, size 4.2 cm  Tumor is limited to the kidney (pT1b)  Ureteral, vascular and all margins of resection are negative for tumor    06/30/2023 Imaging   CT from [facility]: New small LEFT adrenal nodule 13 mm compared to 07/02/2022 Right nephrectomy without evidence of local recurrence   07/23/2023 PET scan   PET No areas of abnormal radiotracer uptake identified.   The small right lower lobe lung nodule is seen today but does not have abnormal uptake. Lesion is small. Would recommend follow up surveillance in 3-6 months.   Left adrenal nodule also does not have abnormal uptake. This is new. An aggressive process is still possible. Next step in the workup to consider would be MRI with and without contrast and in/out of phase.   Previous right nephrectomy.  History of renal cell carcinoma     MEDICAL HISTORY:  Past Medical History:  Diagnosis Date   History of kidney stones    Hyperlipidemia    Hypertension    Sleep apnea    CPAP    SURGICAL HISTORY: Past Surgical History:  Procedure Laterality Date   NO PAST SURGERIES     ROBOT ASSISTED LAPAROSCOPIC NEPHRECTOMY Right 02/25/2021   Procedure: XI ROBOTIC ASSISTED LAPAROSCOPIC NEPHRECTOMY;  Surgeon: Adelbert Homans, MD;  Location: [facility];  Service: Urology;  Laterality: Right;   WISDOM TOOTH EXTRACTION      SOCIAL HISTORY: Social History   Socioeconomic History   Marital status: Married    Spouse name: Not on file   Number of children: 2   Years of education: BA   Highest education level: Bachelor's degree (e.g., BA, AB, BS)  Occupational History    Employer: [employer]    Comment: Insurance Company  Tobacco Use   Smoking status: Former    Current packs/day: 0.00    Types: Cigarettes    Quit date: 03/17/2007    Years since quitting: 16.4    Passive exposure: Never   Smokeless tobacco: Never  Vaping Use   Vaping status: Never Used  Substance and Sexual Activity   Alcohol use:  Not Currently    Comment: 2-3 beers per week   Drug use: No   Sexual activity: Not Currently    Birth control/protection: None  Other Topics Concern   Not on file  Social History Narrative   Married, twin girls.   Patient lives at home with his family.   Caffeine Use:    Social Drivers of Corporate investment banker Strain: Low Risk  (08/27/2023)   Overall Financial Resource Strain (CARDIA)    Difficulty of Paying Living Expenses: Not very hard  Food Insecurity: No Food Insecurity (08/27/2023)   Hunger Vital Sign    Worried About Running Out of Food in the Last Year: Never true    Ran Out of Food in the Last Year: Never true  Transportation Needs: No Transportation Needs (08/27/2023)   PRAPARE - Administrator, Civil Service (Medical): No    Lack of Transportation (Non-Medical): No  Physical Activity: Insufficiently Active (08/27/2023)   Exercise Vital Sign    Days of Exercise per Week: 1 day    Minutes of Exercise per Session: 30 min  Stress: No Stress Concern Present (08/27/2023)   [employer] of Occupational Health - Occupational Stress Questionnaire    Feeling of Stress: Only a little  Recent  Concern: Stress - Stress Concern Present (06/24/2023)   [employer] of Occupational Health - Occupational Stress Questionnaire    Feeling of Stress : To some extent  Social Connections: Moderately Integrated (08/27/2023)   Social Connection and Isolation Panel    Frequency of Communication with Friends and Family: More than three times a week    Frequency of Social Gatherings with Friends and Family: Twice a week    Attends Religious Services: More than 4 times per year    Active Member of [employer] or Organizations: No    Attends Engineer, structural: Not on file    Marital Status: Married  Catering manager Violence: Not on file    FAMILY HISTORY: Family History  Problem Relation Age of Onset   Hypertension Father    Kidney Stones Father    Benign  prostatic hyperplasia Father    Diabetes Mother    Cancer Mother        cholangiocarcinoma   Prostate cancer Paternal Grandfather    Cancer Maternal Grandmother        colon   Colon cancer Maternal Grandmother    Lung cancer Maternal Grandmother     ALLERGIES:  has no known allergies.  MEDICATIONS:  Current Outpatient Medications  Medication Sig Dispense Refill   amLODipine (NORVASC) 5 MG tablet Take 5 mg by mouth daily.     atorvastatin  (LIPITOR) 20 MG tablet TAKE 1 TABLET BY MOUTH DAILY AT  6 PM 90 tablet 3   cloNIDine  (CATAPRES ) 0.1 MG tablet TAKE 1 TABLET BY MOUTH TWICE  DAILY 180 tablet 3   fenofibrate  160 MG tablet TAKE 1 TABLET BY MOUTH DAILY 90 tablet 3   fluticasone  (FLONASE ) 50 MCG/ACT nasal spray Place 2 sprays into both nostrils daily.  4   multivitamin (ONE-A-DAY MEN'S) TABS Take 1 tablet by mouth daily.     Tiotropium Bromide-Olodaterol (STIOLTO RESPIMAT ) 2.5-2.5 MCG/ACT AERS Inhale 2 puffs into the lungs daily. 12 g 3   No current facility-administered medications for this visit.    REVIEW OF SYSTEMS:   All relevant systems were reviewed with the patient and are negative.  PHYSICAL EXAMINATION: ECOG PERFORMANCE STATUS: {CHL ONC ECOG [phone]}  There were no vitals filed for this visit. There were no vitals filed for this visit.  GENERAL: alert, no distress and comfortable SKIN: skin color is normal, no jaundice, rashes or significant lesions EYES: sclera clear OROPHARYNX: no exudate, no erythema NECK: supple LYMPH:  no palpable lymphadenopathy in the cervical, axillary regions LUNGS: Effort normal, no respiratory distress.  Clear to auscultation bilaterally HEART: regular rate & rhythm and no lower extremity edema ABDOMEN: soft, non-tender and nondistended Musculoskeletal: no point tenderness NEURO: no focal motor/sensory deficits  LABORATORY DATA:  I have reviewed the data as listed Lab Results  Component Value Date   WBC 6.4 08/26/2023   HGB  13.9 08/26/2023   HCT 39.9 08/26/2023   MCV 80.4 08/26/2023   PLT 222 08/26/2023   Recent Labs    12/21/22 1318 06/24/23 0947  NA 139 139  K 4.5 4.4  CL 100 101  CO2 28 29  GLUCOSE 91 109*  BUN 15 17  CREATININE 1.44 1.64*  CALCIUM  10.2 10.2  PROT 7.8 7.9  ALBUMIN 5.1 5.2  AST 27 28  ALT 31 35  ALKPHOS 50 52  BILITOT 0.8 0.7  BILIDIR 0.2 0.2    RADIOGRAPHIC STUDIES: I have personally reviewed the radiological images as listed and agreed with the findings in  the report. CT BIOPSY Result Date: 08/26/2023 Rosetta Cons, MD     08/26/2023  8:48 AM Pre procedural Dx: Left adrenal mass Post procedural Dx: Same Technically successful CT guided biopsy of left adrenal mass  EBL: None. Complications: None immediate. Dakota Hillock, MD Pager #: [phone]

## 2023-09-02 ENCOUNTER — Inpatient Hospital Stay

## 2023-09-02 VITALS — BP 128/80 | HR 77 | Temp 97.5°F | Resp 18 | Ht 67.0 in | Wt 229.0 lb

## 2023-09-02 DIAGNOSIS — E278 Other specified disorders of adrenal gland: Secondary | ICD-10-CM | POA: Insufficient documentation

## 2023-09-02 DIAGNOSIS — E279 Disorder of adrenal gland, unspecified: Secondary | ICD-10-CM

## 2023-09-02 DIAGNOSIS — Z87891 Personal history of nicotine dependence: Secondary | ICD-10-CM | POA: Diagnosis not present

## 2023-09-02 DIAGNOSIS — R918 Other nonspecific abnormal finding of lung field: Secondary | ICD-10-CM | POA: Diagnosis present

## 2023-09-02 DIAGNOSIS — Z809 Family history of malignant neoplasm, unspecified: Secondary | ICD-10-CM

## 2023-09-02 DIAGNOSIS — C641 Malignant neoplasm of right kidney, except renal pelvis: Secondary | ICD-10-CM

## 2023-09-02 DIAGNOSIS — Z85528 Personal history of other malignant neoplasm of kidney: Secondary | ICD-10-CM | POA: Diagnosis not present

## 2023-09-02 DIAGNOSIS — Z905 Acquired absence of kidney: Secondary | ICD-10-CM | POA: Diagnosis not present

## 2023-09-02 LAB — CBC WITH DIFFERENTIAL (CANCER CENTER ONLY)
Abs Immature Granulocytes: 0.03 10*3/uL (ref 0.00–0.07)
Basophils Absolute: 0.1 10*3/uL (ref 0.0–0.1)
Basophils Relative: 1 %
Eosinophils Absolute: 0.2 10*3/uL (ref 0.0–0.5)
Eosinophils Relative: 3 %
HCT: 43.1 % (ref 39.0–52.0)
Hemoglobin: 15.3 g/dL (ref 13.0–17.0)
Immature Granulocytes: 0 %
Lymphocytes Relative: 28 %
Lymphs Abs: 2 10*3/uL (ref 0.7–4.0)
MCH: 28.2 pg (ref 26.0–34.0)
MCHC: 35.5 g/dL (ref 30.0–36.0)
MCV: 79.4 fL — ABNORMAL LOW (ref 80.0–100.0)
Monocytes Absolute: 0.8 10*3/uL (ref 0.1–1.0)
Monocytes Relative: 11 %
Neutro Abs: 4.2 10*3/uL (ref 1.7–7.7)
Neutrophils Relative %: 57 %
Platelet Count: 276 10*3/uL (ref 150–400)
RBC: 5.43 MIL/uL (ref 4.22–5.81)
RDW: 12.4 % (ref 11.5–15.5)
WBC Count: 7.3 10*3/uL (ref 4.0–10.5)
nRBC: 0 % (ref 0.0–0.2)

## 2023-09-02 LAB — CMP (CANCER CENTER ONLY)
ALT: 35 U/L (ref 0–44)
AST: 31 U/L (ref 15–41)
Albumin: 4.9 g/dL (ref 3.5–5.0)
Alkaline Phosphatase: 48 U/L (ref 38–126)
Anion gap: 8 (ref 5–15)
BUN: 15 mg/dL (ref 6–20)
CO2: 27 mmol/L (ref 22–32)
Calcium: 10.1 mg/dL (ref 8.9–10.3)
Chloride: 105 mmol/L (ref 98–111)
Creatinine: 1.52 mg/dL — ABNORMAL HIGH (ref 0.61–1.24)
GFR, Estimated: 55 mL/min — ABNORMAL LOW (ref >60)
Glucose, Bld: 120 mg/dL — ABNORMAL HIGH (ref 70–99)
Potassium: 4.5 mmol/L (ref 3.5–5.1)
Sodium: 140 mmol/L (ref 135–145)
Total Bilirubin: 0.7 mg/dL (ref 0.0–1.2)
Total Protein: 7.9 g/dL (ref 6.5–8.1)

## 2023-09-02 LAB — LACTATE DEHYDROGENASE: LDH: 170 U/L (ref 98–192)

## 2023-09-02 NOTE — Assessment & Plan Note (Addendum)
 Given his early age at diagnosis, 4.2 cm at diagnosis; likely he had this before 50. Several family with malignancies. Will refer to genetics non-urgently.

## 2023-09-02 NOTE — Assessment & Plan Note (Addendum)
 6 mm nodule in the medial right lung base, on image 112. No specific abnormal uptake.   The 3 mm left lower lobe nodules also identified on image 83 and is without uptake but again the lesions are small and the below threshold.   The left-sided lesion has been present since 2022 demonstrating long-term stability.   The right-sided lesion is not seen previously. Continue surveillance CT in the future.

## 2023-09-03 LAB — CORTISOL, URINE, FREE
Cortisol (Ur), Free: 10 ug/(24.h) (ref 5–64)
Cortisol,F,ug/L,U: 3 ug/L

## 2023-09-03 LAB — DHEA-SULFATE: DHEA-SO4: 874 ug/dL — ABNORMAL HIGH (ref 71.6–375.4)

## 2023-09-06 ENCOUNTER — Other Ambulatory Visit: Payer: Self-pay | Admitting: Urology

## 2023-09-06 ENCOUNTER — Ambulatory Visit: Payer: Self-pay | Admitting: Family Medicine

## 2023-09-06 DIAGNOSIS — D497 Neoplasm of unspecified behavior of endocrine glands and other parts of nervous system: Secondary | ICD-10-CM

## 2023-09-07 ENCOUNTER — Other Ambulatory Visit: Payer: Self-pay | Admitting: *Deleted

## 2023-09-07 ENCOUNTER — Other Ambulatory Visit: Payer: Self-pay

## 2023-09-07 DIAGNOSIS — E278 Other specified disorders of adrenal gland: Secondary | ICD-10-CM | POA: Diagnosis not present

## 2023-09-07 DIAGNOSIS — C641 Malignant neoplasm of right kidney, except renal pelvis: Secondary | ICD-10-CM

## 2023-09-09 ENCOUNTER — Encounter: Payer: Self-pay | Admitting: Family Medicine

## 2023-09-10 ENCOUNTER — Ambulatory Visit: Payer: Self-pay

## 2023-09-10 LAB — CATECHOLAMINES,UR.,FREE,24 HR
Dopamine, Rand Ur: 44 ug/L
Dopamine, Ur, 24Hr: 103 ug/(24.h) (ref 0–510)
Epinephrine, Rand Ur: <3 ug/L
Epinephrine, U, 24Hr: <7 ug/(24.h) (ref 0–20)
Norepinephrine, Rand Ur: <15 ug/L
Norepinephrine,U,24H: <35 ug/(24.h) (ref 0–135)
Total Volume: 2350

## 2023-09-20 ENCOUNTER — Other Ambulatory Visit: Payer: Self-pay | Admitting: Urology

## 2023-09-24 ENCOUNTER — Other Ambulatory Visit: Payer: Self-pay | Admitting: Family Medicine

## 2023-09-29 NOTE — Progress Notes (Signed)
 COVID Vaccine received:  []  No [x]  Yes Date of any COVID positive Test in last 90 days:  PCP - Comer Greet, MD  Cardiologist -  Neurology- Eduard Hanlon, MD  Oncology- Pauletta Chihuahua, MD   Chest x-ray - 06-24-2021  2v  Epic EKG -  [phone])  will  repeat Stress Test -  ECHO - 08-25-2021  Epic Cardiac Cath -  CT Coronary Calcium  score:   Bowel Prep - [x]  No  []   Yes ______  Pacemaker / ICD device [x]  No []  Yes   Spinal Cord Stimulator:[x]  No []  Yes       History of Sleep Apnea? []  No [x]  Yes   CPAP used?- []  No [x]  Yes    Does the patient monitor blood sugar?   [x]  N/A   []  No []  Yes  Patient has: [x]  NO Hx DM   []  Pre-DM   []  DM1  []   DM2  Blood Thinner / Instructions:  none Aspirin Instructions: none  ERAS Protocol Ordered: [x]  No  []  Yes Patient is to be NPO after: MN Prior  Dental hx: []  Dentures:  []  N/A      []  Bridge or Partial:                   []  Loose or Damaged teeth:   Comments:   Activity level: Able to walk up 2 flights of stairs without becoming significantly short of breath or having chest pain?  []  No   []    Yes   Anesthesia review: OSA-CPAP, HTN, CKD3, s/p right nephrectomy (02-25-2021)  Patient denies any S&S of respiratory illness or Covid - no shortness of breath, fever, cough or chest pain at PAT appointment.  Patient verbalized understanding and agreement to the Pre-Surgical Instructions that were given to them at this PAT appointment. Patient was also educated of the need to review these PAT instructions again prior to his surgery.I reviewed the appropriate phone numbers to call if they have any and questions or concerns.

## 2023-09-29 NOTE — Patient Instructions (Signed)
 SURGICAL WAITING ROOM VISITATION Patients having surgery or a procedure may have no more than 2 support people in the waiting area - these visitors may rotate in the visitor waiting room.   If the patient needs to stay at the hospital during part of their recovery, the visitor guidelines for inpatient rooms apply.  PRE-OP VISITATION  Pre-op nurse will coordinate an appropriate time for 1 support person to accompany the patient in pre-op.  This support person may not rotate.  This visitor will be contacted when the time is appropriate for the visitor to come back in the pre-op area.  Please refer to the [facility] website for the visitor guidelines for Inpatients (after your surgery is over and you are in a regular room).  You are not required to quarantine at this time prior to your surgery. However, you must do this: Hand Hygiene often Do NOT share personal items Notify your provider if you are in close contact with someone who has COVID or you develop fever 100.4 or greater, new onset of sneezing, cough, sore throat, shortness of breath or body aches.  If you test positive for Covid or have been in contact with anyone that has tested positive in the last 10 days please notify you surgeon.    Your procedure is scheduled on:  FRIDAY  October 01, 2023  Report to [facility] Main Entrance: [address] entrance where the [employer] is available.   Report to admitting at:  10:00   AM  Call this number if you have any questions or problems the morning of surgery [phone]  DO NOT EAT OR DRINK ANYTHING AFTER MIDNIGHT THE NIGHT PRIOR TO YOUR SURGERY / PROCEDURE.   FOLLOW  ANY ADDITIONAL PRE OP INSTRUCTIONS YOU RECEIVED FROM YOUR SURGEON'S OFFICE!!!   Oral Hygiene is also important to reduce your risk of infection.        Remember - BRUSH YOUR TEETH THE MORNING OF SURGERY WITH YOUR REGULAR TOOTHPASTE  Do NOT smoke after Midnight the night before surgery.  STOP TAKING all Vitamins,  Herbs and supplements 1 week before your surgery.   Take ONLY these medicines the morning of surgery with A SIP OF WATER : clonidine , and you may use your Stiolto inhaler if needed   If You have been diagnosed with Sleep Apnea - Bring CPAP mask and tubing day of surgery. We will provide you with a CPAP machine on the day of your surgery.                   You may not have any metal on your body including  jewelry, and body piercing  Do not wear  lotions, powders, cologne, or deodorant  Men may shave face and neck.  Contacts, Hearing Aids, dentures or bridgework may not be worn into surgery. DENTURES WILL BE REMOVED PRIOR TO SURGERY PLEASE DO NOT APPLY Poly grip OR ADHESIVES!!!  You may bring a small overnight bag with you on the day of surgery, only pack items that are not valuable. [place] IS NOT RESPONSIBLE   FOR VALUABLES THAT ARE LOST OR STOLEN.   Do not bring your home medications to the hospital. The Pharmacy will dispense medications listed on your medication list to you during your admission in the Hospital.  Please read over the following fact sheets you were given: IF YOU HAVE QUESTIONS ABOUT YOUR PRE-OP INSTRUCTIONS, PLEASE CALL [phone].   [facility] - Preparing for Surgery Before surgery, you can play  an important role.  Because skin is not sterile, your skin needs to be as free of germs as possible.  You can reduce the number of germs on your skin by washing with CHG (chlorahexidine gluconate) soap before surgery.  CHG is an antiseptic cleaner which kills germs and bonds with the skin to continue killing germs even after washing. Please DO NOT use if you have an allergy to CHG or antibacterial soaps.  If your skin becomes reddened/irritated stop using the CHG and inform your nurse when you arrive at Short Stay. Do not shave (including legs and underarms) for at least 48 hours prior to the first CHG shower.  You may shave your face/neck.  Please follow these  instructions carefully:  1.  Shower with CHG Soap the night before surgery and the  morning of surgery.  2.  If you choose to wash your hair, wash your hair first as usual with your normal  shampoo.  3.  After you shampoo, rinse your hair and body thoroughly to remove the shampoo.                             4.  Use CHG as you would any other liquid soap.  You can apply chg directly to the skin and wash.  Gently with a scrungie or clean washcloth.  5.  Apply the CHG Soap to your body ONLY FROM THE NECK DOWN.   Do not use on face/ open                           Wound or open sores. Avoid contact with eyes, ears mouth and genitals (private parts).                       Wash face,  Genitals (private parts) with your normal soap.             6.  Wash thoroughly, paying special attention to the area where your  surgery  will be performed.  7.  Thoroughly rinse your body with warm water  from the neck down.  8.  DO NOT shower/wash with your normal soap after using and rinsing off the CHG Soap.            9.  Pat yourself dry with a clean towel.            10.  Wear clean pajamas.            11.  Place clean sheets on your bed the night of your first shower and do not  sleep with pets.  ON THE DAY OF SURGERY : Do not apply any lotions/deodorants the morning of surgery.  Please wear clean clothes to the hospital/surgery center.    FAILURE TO FOLLOW THESE INSTRUCTIONS MAY RESULT IN THE CANCELLATION OF YOUR SURGERY  PATIENT SIGNATURE_________________________________  NURSE SIGNATURE__________________________________  ________________________________________________________________________

## 2023-09-30 ENCOUNTER — Other Ambulatory Visit: Payer: Self-pay

## 2023-09-30 ENCOUNTER — Encounter (HOSPITAL_COMMUNITY): Payer: Self-pay

## 2023-09-30 ENCOUNTER — Encounter (HOSPITAL_COMMUNITY)
Admission: RE | Admit: 2023-09-30 | Discharge: 2023-09-30 | Disposition: A | Discharge status: Home / Self Care | Source: Ambulatory Visit | Attending: Urology | Admitting: Urology

## 2023-09-30 VITALS — BP 132/88 | HR 78 | Temp 98.1°F | Resp 16 | Ht 67.0 in | Wt 250.0 lb

## 2023-09-30 DIAGNOSIS — K219 Gastro-esophageal reflux disease without esophagitis: Secondary | ICD-10-CM | POA: Insufficient documentation

## 2023-09-30 DIAGNOSIS — Z87891 Personal history of nicotine dependence: Secondary | ICD-10-CM | POA: Diagnosis not present

## 2023-09-30 DIAGNOSIS — I10 Essential (primary) hypertension: Secondary | ICD-10-CM

## 2023-09-30 DIAGNOSIS — N189 Chronic kidney disease, unspecified: Secondary | ICD-10-CM | POA: Insufficient documentation

## 2023-09-30 DIAGNOSIS — D4412 Neoplasm of uncertain behavior of left adrenal gland: Secondary | ICD-10-CM | POA: Insufficient documentation

## 2023-09-30 DIAGNOSIS — Z01818 Encounter for other preprocedural examination: Secondary | ICD-10-CM | POA: Diagnosis not present

## 2023-09-30 DIAGNOSIS — Z01812 Encounter for preprocedural laboratory examination: Secondary | ICD-10-CM | POA: Diagnosis present

## 2023-09-30 DIAGNOSIS — I129 Hypertensive chronic kidney disease with stage 1 through stage 4 chronic kidney disease, or unspecified chronic kidney disease: Secondary | ICD-10-CM | POA: Diagnosis not present

## 2023-09-30 DIAGNOSIS — Z01810 Encounter for preprocedural cardiovascular examination: Secondary | ICD-10-CM | POA: Diagnosis present

## 2023-09-30 DIAGNOSIS — N289 Disorder of kidney and ureter, unspecified: Secondary | ICD-10-CM

## 2023-09-30 DIAGNOSIS — G4733 Obstructive sleep apnea (adult) (pediatric): Secondary | ICD-10-CM | POA: Insufficient documentation

## 2023-09-30 HISTORY — DX: Malignant (primary) neoplasm, unspecified: C80.1

## 2023-09-30 HISTORY — DX: Gastro-esophageal reflux disease without esophagitis: K21.9

## 2023-09-30 HISTORY — DX: Anxiety disorder, unspecified: F41.9

## 2023-09-30 HISTORY — DX: Chronic kidney disease, unspecified: N18.9

## 2023-09-30 LAB — CBC
HCT: 41.4 % (ref 39.0–52.0)
Hemoglobin: 14.4 g/dL (ref 13.0–17.0)
MCH: 28.2 pg (ref 26.0–34.0)
MCHC: 34.8 g/dL (ref 30.0–36.0)
MCV: 81.2 fL (ref 80.0–100.0)
Platelets: 232 K/uL (ref 150–400)
RBC: 5.1 MIL/uL (ref 4.22–5.81)
RDW: 12.1 % (ref 11.5–15.5)
WBC: 6 K/uL (ref 4.0–10.5)
nRBC: 0 % (ref 0.0–0.2)

## 2023-09-30 LAB — BASIC METABOLIC PANEL WITH GFR
Anion gap: 12 (ref 5–15)
BUN: 15 mg/dL (ref 6–20)
CO2: 23 mmol/L (ref 22–32)
Calcium: 10 mg/dL (ref 8.9–10.3)
Chloride: 104 mmol/L (ref 98–111)
Creatinine, Ser: 1.42 mg/dL — ABNORMAL HIGH (ref 0.61–1.24)
GFR, Estimated: >60 mL/min (ref >60)
Glucose, Bld: 116 mg/dL — ABNORMAL HIGH (ref 70–99)
Potassium: 4.4 mmol/L (ref 3.5–5.1)
Sodium: 139 mmol/L (ref 135–145)

## 2023-09-30 NOTE — Anesthesia Preprocedure Evaluation (Signed)
 Anesthesia Evaluation  Patient identified by MRN, date of birth, ID band Patient awake    Reviewed: Allergy & Precautions, NPO status , Patient's Chart, lab work & pertinent test results  Airway Mallampati: I  TM Distance: >3 FB Neck ROM: Full    Dental  (+) Dental Advisory Given, Chipped,    Pulmonary sleep apnea and Continuous Positive Airway Pressure Ventilation , former smoker   Pulmonary exam normal breath sounds clear to auscultation       Cardiovascular hypertension, Pt. on medications Normal cardiovascular exam Rhythm:Regular Rate:Normal     Neuro/Psych  Headaches  Anxiety      negative psych ROS   GI/Hepatic Neg liver ROS,GERD  ,,  Endo/Other  negative endocrine ROS    Renal/GU Renal InsufficiencyRenal disease  negative genitourinary   Musculoskeletal negative musculoskeletal ROS (+)    Abdominal  (+) + obese  Peds  Hematology negative hematology ROS (+)   Anesthesia Other Findings Adrenal mass  Reproductive/Obstetrics                              Anesthesia Physical Anesthesia Plan  ASA: 3  Anesthesia Plan: General   Post-op Pain Management:    Induction: Intravenous  PONV Risk Score and Plan: 2 and Midazolam , Ondansetron  and Treatment may vary due to age or medical condition  Airway Management Planned: Oral ETT  Additional Equipment:   Intra-op Plan:   Post-operative Plan: Extubation in OR  Informed Consent: I have reviewed the patients History and Physical, chart, labs and discussed the procedure including the risks, benefits and alternatives for the proposed anesthesia with the patient or authorized representative who has indicated his/her understanding and acceptance.     Dental advisory given  Plan Discussed with: CRNA  Anesthesia Plan Comments: (See note from 7/17)         Anesthesia Quick Evaluation

## 2023-09-30 NOTE — Progress Notes (Signed)
 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 PMH of former smoking, HTN, chronic SOB, OSA (uses CPAP), GERD, R renal mass s/p R nephrectomy (2022), CKD.  Patient had consult with Pulmonology in 2023 due to SOB/DOE. PFTs showed air trapping and he was given an inhaler without improvement. Echo was obtained which showed normal LVEF, normal valves.  Seen by PCP on 08/27/23 for SOB. C/f Cushing's disease. Also concern for possible neuromuscular disease and will be referred to Neurology vs Rheum.  Seen by Oncology on 09/02/23. He had a biopsy of the adrenal nodule which was non diagnostic. He has elected to undergo resection.   VS: BP 132/88 Comment: right arm sitting  Pulse 78   Temp 36.7 C (Oral)   Resp 16   Ht 5' 7 (1.702 m)   Wt 113.4 kg   SpO2 100%   BMI 39.16 kg/m   PROVIDERS: Mahlon Comer BRAVO, MD   LABS: Labs reviewed: Acceptable for surgery. (all labs ordered are listed, but only abnormal results are displayed)  Labs Reviewed  BASIC METABOLIC PANEL WITH GFR - Abnormal; Notable for the following components:      Result Value   Glucose, Bld 116 (*)    Creatinine, Ser 1.42 (*)    All other components within normal limits  CBC  TYPE AND SCREEN     IMAGES: PET scan 07/23/23:  IMPRESSION: No areas of abnormal radiotracer uptake identified.   The small right lower lobe lung nodule is seen today but does not have abnormal uptake. Lesion is small. Would recommend follow up surveillance in 3-6 months.   Left adrenal nodule also does not have abnormal uptake. This is new. An aggressive  process is still possible. Next step in the workup to consider would be MRI with and without contrast and in/out of phase.   Previous right nephrectomy.  History of renal cell carcinoma    EKG 09/30/23:  NSR, rate 78  CV: Echo 08/25/2021:  IMPRESSIONS    1. Left ventricular ejection fraction, by estimation, is 60 to 65%. The left ventricle has normal function. The left ventricle has no regional wall motion abnormalities. Left ventricular diastolic parameters were normal. The average left ventricular global longitudinal strain is -20.5 %. The global longitudinal strain is normal.  2. Right ventricular systolic function is normal. The right ventricular size is normal. There is normal pulmonary artery systolic pressure.  3. The mitral valve is normal in structure. Trivial mitral valve regurgitation. No evidence of mitral stenosis.  4. The aortic valve is tricuspid. Aortic valve regurgitation is not visualized. No aortic stenosis is present.  5. The inferior vena cava is normal in size with greater than 50% respiratory variability, suggesting right atrial pressure of 3 mmHg. Past Medical History:  Diagnosis Date   Anxiety    Cancer (HCC)    clear cell  renal cancer   Chronic kidney disease    CKD2-3   GERD (gastroesophageal reflux disease)    History of kidney stones    Hyperlipidemia    Hypertension    Sleep apnea    CPAP  Past Surgical History:  Procedure Laterality Date   NO PAST SURGERIES     ROBOT ASSISTED LAPAROSCOPIC NEPHRECTOMY Right 02/25/2021   Procedure: XI ROBOTIC ASSISTED LAPAROSCOPIC NEPHRECTOMY;  Surgeon: Devere Lonni Righter, MD;  Location: [facility];  Service: Urology;  Laterality: Right;   WISDOM TOOTH EXTRACTION      MEDICATIONS:  amLODipine (NORVASC) 5 MG tablet   atorvastatin  (LIPITOR) 20 MG tablet   cloNIDine  (CATAPRES ) 0.1 MG tablet   fenofibrate  160 MG tablet   fluticasone  (FLONASE ) 50 MCG/ACT nasal spray   magnesium oxide (MAG-OX) 400  (240 Mg) MG tablet   multivitamin (ONE-A-DAY MEN'S) TABS   Tiotropium Bromide-Olodaterol (STIOLTO RESPIMAT ) 2.5-2.5 MCG/ACT AERS   No current facility-administered medications for this encounter.    Burnard CHRISTELLA Odis DEVONNA [facility] Short Stay/Anesthesiology [facility] Phone [phone] 09/30/2023 2:03 PM

## 2023-09-30 NOTE — H&P (Signed)
 Urology Preoperative H&P   Chief Complaint: Left adrenal mass  History of Present Illness: Dakota Reid is a 53 y.o. male with pT1b clear cell carcinoma involving the right kidney, s/p right RALNx on 02/25/22. He was found to have a 13 mm left adrenal mass with features concerning for RCC met vs adrenal primary.  Attempted biopsy of the adrenal lesion was unsuccessful.  Serum metanephrines from 07/01/23 were WNL, but his aldosterone/renin ratio and DHEA levels were slightly above the normal range.  He is here today for a robotic left adrenalectomy.     Past Medical History:  Diagnosis Date   Anxiety    Cancer (HCC)    clear cell  renal cancer   Chronic kidney disease    CKD2-3   GERD (gastroesophageal reflux disease)    History of kidney stones    Hyperlipidemia    Hypertension    Sleep apnea    CPAP    Past Surgical History:  Procedure Laterality Date   NO PAST SURGERIES     ROBOT ASSISTED LAPAROSCOPIC NEPHRECTOMY Right 02/25/2021   Procedure: XI ROBOTIC ASSISTED LAPAROSCOPIC NEPHRECTOMY;  Surgeon: Devere Lonni Righter, MD;  Location: [facility];  Service: Urology;  Laterality: Right;   WISDOM TOOTH EXTRACTION      Allergies: No Known Allergies  Family History  Problem Relation Age of Onset   Hypertension Father    Kidney Stones Father    Benign prostatic hyperplasia Father    Diabetes Mother    Cancer Mother        cholangiocarcinoma   Prostate cancer Paternal Grandfather    Cancer Maternal Grandmother        colon   Colon cancer Maternal Grandmother    Lung cancer Maternal Grandmother     Social History:  reports that he quit smoking about 16 years ago. His smoking use included cigarettes. He has never been exposed to tobacco smoke. He has never used smokeless tobacco. He reports that he does not currently use alcohol. He reports that he does not use drugs.  ROS: A complete review of systems was performed.  All systems are negative except for pertinent findings  as noted.  Physical Exam:  Vital signs in last 24 hours: Temp:  [98.1 F (36.7 C)] 98.1 F (36.7 C) (07/17 1000) Pulse Rate:  [78] 78 (07/17 1000) Resp:  [16] 16 (07/17 1000) BP: (132)/(88) 132/88 (07/17 1000) SpO2:  [100 %] 100 % (07/17 1000) Weight:  [113.4 kg] 113.4 kg (07/17 1000) Constitutional:  Alert and oriented, No acute distress Cardiovascular: Regular rate and rhythm, No JVD Respiratory: Normal respiratory effort, Lungs clear bilaterally GI: Abdomen is soft, nontender, nondistended, no abdominal masses GU: No CVA tenderness Lymphatic: No lymphadenopathy Neurologic: Grossly intact, no focal deficits Psychiatric: Normal mood and affect  Laboratory Data:  Recent Labs    09/30/23 1032  WBC 6.0  HGB 14.4  HCT 41.4  PLT 232    Recent Labs    09/30/23 1032  NA 139  K 4.4  CL 104  GLUCOSE 116*  BUN 15  CALCIUM  10.0  CREATININE 1.42*     Results for orders placed or performed during the hospital encounter of 09/30/23 (from the past 24 hours)  CBC     Status: None   Collection Time: 09/30/23 10:32 AM  Result Value Ref Range   WBC 6.0 4.0 - 10.5 K/uL   RBC 5.10 4.22 - 5.81 MIL/uL   Hemoglobin 14.4 13.0 - 17.0 g/dL   HCT  41.4 39.0 - 52.0 %   MCV 81.2 80.0 - 100.0 fL   MCH 28.2 26.0 - 34.0 pg   MCHC 34.8 30.0 - 36.0 g/dL   RDW 87.8 88.4 - 84.4 %   Platelets 232 150 - 400 K/uL   nRBC 0.0 0.0 - 0.2 %  Basic metabolic panel     Status: Abnormal   Collection Time: 09/30/23 10:32 AM  Result Value Ref Range   Sodium 139 135 - 145 mmol/L   Potassium 4.4 3.5 - 5.1 mmol/L   Chloride 104 98 - 111 mmol/L   CO2 23 22 - 32 mmol/L   Glucose, Bld 116 (H) 70 - 99 mg/dL   BUN 15 6 - 20 mg/dL   Creatinine, Ser 8.57 (H) 0.61 - 1.24 mg/dL   Calcium  10.0 8.9 - 10.3 mg/dL   GFR, Estimated >39 >39 mL/min   Anion gap 12 5 - 15  Type and screen     Status: None   Collection Time: 09/30/23 10:32 AM  Result Value Ref Range   ABO/RH(D) O POS    Antibody Screen NEG     Sample Expiration 10/14/2023,2359    Extend sample reason      NO TRANSFUSIONS OR PREGNANCY IN THE PAST 3 MONTHS Performed at [facility], [address], [place], [place] [address]    No results found for this or any previous visit (from the past 240 hours).  Renal Function: Recent Labs    09/30/23 1032  CREATININE 1.42*   Estimated Creatinine Clearance: 74.8 mL/min (A) (by C-G formula based on SCr of 1.42 mg/dL (H)).  Radiologic Imaging: No results found.  I independently reviewed the above imaging studies.  Assessment and Plan Dakota Reid is a 53 y.o. male with a 13 mm left adrenal mass   The risks of robot-assisted laparoscopic LEFT adrenalectomy were discussed in detail including but not limited to:  negative pathology, open conversion, infection of the skin/abdominal cavity, VTE, MI/CVA, lymphatic leak, injury to adjacent solid/hollow viscus organs, bleeding requiring a blood transfusion, catastrophic bleeding, hernia formation and other imponderables.  The patient voices understanding and wishes to proceed.   Lonni Han, MD 09/30/2023, 12:53 PM  [facility] Pager: [phone]

## 2023-10-01 ENCOUNTER — Encounter (HOSPITAL_COMMUNITY): Admitting: Medical

## 2023-10-01 ENCOUNTER — Encounter (HOSPITAL_COMMUNITY): Payer: Self-pay | Admitting: Urology

## 2023-10-01 ENCOUNTER — Observation Stay (HOSPITAL_COMMUNITY)
Admission: RE | Admit: 2023-10-01 | Discharge: 2023-10-02 | Disposition: A | Discharge status: Home / Self Care | Attending: Urology | Admitting: Urology

## 2023-10-01 ENCOUNTER — Encounter (HOSPITAL_COMMUNITY)
Admission: RE | Disposition: A | Discharge status: Home / Self Care | Payer: Self-pay | Source: Home / Self Care | Attending: Urology

## 2023-10-01 ENCOUNTER — Other Ambulatory Visit: Payer: Self-pay

## 2023-10-01 ENCOUNTER — Ambulatory Visit (HOSPITAL_COMMUNITY): Admitting: Certified Registered"

## 2023-10-01 DIAGNOSIS — E278 Other specified disorders of adrenal gland: Principal | ICD-10-CM | POA: Diagnosis present

## 2023-10-01 DIAGNOSIS — I129 Hypertensive chronic kidney disease with stage 1 through stage 4 chronic kidney disease, or unspecified chronic kidney disease: Secondary | ICD-10-CM | POA: Insufficient documentation

## 2023-10-01 DIAGNOSIS — E1122 Type 2 diabetes mellitus with diabetic chronic kidney disease: Secondary | ICD-10-CM | POA: Insufficient documentation

## 2023-10-01 DIAGNOSIS — N183 Chronic kidney disease, stage 3 unspecified: Secondary | ICD-10-CM | POA: Insufficient documentation

## 2023-10-01 DIAGNOSIS — Z87891 Personal history of nicotine dependence: Secondary | ICD-10-CM | POA: Diagnosis not present

## 2023-10-01 DIAGNOSIS — C641 Malignant neoplasm of right kidney, except renal pelvis: Secondary | ICD-10-CM | POA: Diagnosis not present

## 2023-10-01 DIAGNOSIS — I10 Essential (primary) hypertension: Secondary | ICD-10-CM

## 2023-10-01 DIAGNOSIS — E785 Hyperlipidemia, unspecified: Secondary | ICD-10-CM

## 2023-10-01 DIAGNOSIS — D3502 Benign neoplasm of left adrenal gland: Secondary | ICD-10-CM | POA: Diagnosis not present

## 2023-10-01 HISTORY — PX: ROBOTIC ADRENALECTOMY: SHX6407

## 2023-10-01 LAB — HEMOGLOBIN AND HEMATOCRIT, BLOOD
HCT: 41 % (ref 39.0–52.0)
Hemoglobin: 13.9 g/dL (ref 13.0–17.0)

## 2023-10-01 LAB — TYPE AND SCREEN
ABO/RH(D): O POS
Antibody Screen: NEGATIVE

## 2023-10-01 SURGERY — ADRENALECTOMY, ROBOT-ASSISTED
Anesthesia: General | Site: Abdomen | Laterality: Left

## 2023-10-01 MED ORDER — SODIUM CHLORIDE 0.45 % IV SOLN: INTRAVENOUS | Status: DC

## 2023-10-01 MED ORDER — SODIUM CHLORIDE (PF) 0.9 % IJ SOLN
INTRAMUSCULAR | Status: AC
  Filled 2023-10-01: qty 40

## 2023-10-01 MED ORDER — HYDROMORPHONE HCL 2 MG/ML IJ SOLN
INTRAMUSCULAR | Status: AC
  Filled 2023-10-01: qty 1

## 2023-10-01 MED ORDER — CEFAZOLIN SODIUM-DEXTROSE 2-4 GM/100ML-% IV SOLN
2.0000 g | INTRAVENOUS | Status: AC
  Administered 2023-10-01: 2 g via INTRAVENOUS
  Filled 2023-10-01: qty 100

## 2023-10-01 MED ORDER — FENOFIBRATE 160 MG PO TABS
160.0000 mg | ORAL_TABLET | Freq: Every day | ORAL | Status: DC
  Administered 2023-10-01 – 2023-10-02 (×2): 160 mg via ORAL
  Filled 2023-10-01 (×2): qty 1

## 2023-10-01 MED ORDER — CHLORHEXIDINE GLUCONATE 0.12 % MT SOLN
15.0000 mL | Freq: Once | OROMUCOSAL | Status: AC
  Administered 2023-10-01: 15 mL via OROMUCOSAL

## 2023-10-01 MED ORDER — TRIPLE ANTIBIOTIC 3.5-400-5000 EX OINT: 1.0000 | TOPICAL_OINTMENT | Freq: Three times a day (TID) | CUTANEOUS | Status: DC | PRN

## 2023-10-01 MED ORDER — AMISULPRIDE (ANTIEMETIC) 5 MG/2ML IV SOLN: 10.0000 mg | Freq: Once | INTRAVENOUS | Status: DC | PRN

## 2023-10-01 MED ORDER — ONDANSETRON HCL 4 MG/2ML IJ SOLN
INTRAMUSCULAR | Status: AC
  Filled 2023-10-01: qty 2

## 2023-10-01 MED ORDER — ACETAMINOPHEN 500 MG PO TABS
1000.0000 mg | ORAL_TABLET | Freq: Four times a day (QID) | ORAL | Status: AC
  Administered 2023-10-01 – 2023-10-02 (×4): 1000 mg via ORAL
  Filled 2023-10-01 (×4): qty 2

## 2023-10-01 MED ORDER — FENTANYL CITRATE (PF) 250 MCG/5ML IJ SOLN
INTRAMUSCULAR | Status: AC
Start: 2023-10-01 — End: 2023-10-01
  Filled 2023-10-01: qty 5

## 2023-10-01 MED ORDER — ATORVASTATIN CALCIUM 20 MG PO TABS
20.0000 mg | ORAL_TABLET | Freq: Every day | ORAL | Status: DC
  Administered 2023-10-01: 20 mg via ORAL
  Filled 2023-10-01: qty 1

## 2023-10-01 MED ORDER — DIPHENHYDRAMINE HCL 50 MG/ML IJ SOLN: 12.5000 mg | Freq: Four times a day (QID) | INTRAMUSCULAR | Status: DC | PRN

## 2023-10-01 MED ORDER — HYDROCODONE-ACETAMINOPHEN 5-325 MG PO TABS: 1.0000 | ORAL_TABLET | Freq: Four times a day (QID) | ORAL | 0 refills | Status: DC | PRN

## 2023-10-01 MED ORDER — KETAMINE HCL 50 MG/5ML IJ SOSY
PREFILLED_SYRINGE | INTRAMUSCULAR | Status: AC
Start: 2023-10-01 — End: 2023-10-01
  Filled 2023-10-01: qty 5

## 2023-10-01 MED ORDER — SODIUM CHLORIDE (PF) 0.9 % IJ SOLN
INTRAMUSCULAR | Status: DC | PRN
  Administered 2023-10-01: 20 mL

## 2023-10-01 MED ORDER — ROCURONIUM BROMIDE 10 MG/ML (PF) SYRINGE
PREFILLED_SYRINGE | INTRAVENOUS | Status: AC
  Filled 2023-10-01: qty 10

## 2023-10-01 MED ORDER — DEXAMETHASONE SODIUM PHOSPHATE 10 MG/ML IJ SOLN
INTRAMUSCULAR | Status: DC | PRN
Start: 2023-10-01 — End: 2023-10-01
  Administered 2023-10-01: 10 mg via INTRAVENOUS

## 2023-10-01 MED ORDER — DOCUSATE SODIUM 100 MG PO CAPS
100.0000 mg | ORAL_CAPSULE | Freq: Two times a day (BID) | ORAL | Status: DC
  Administered 2023-10-01 – 2023-10-02 (×2): 100 mg via ORAL
  Filled 2023-10-01 (×2): qty 1

## 2023-10-01 MED ORDER — STERILE WATER FOR IRRIGATION IR SOLN
Status: DC | PRN
  Administered 2023-10-01: 1000 mL

## 2023-10-01 MED ORDER — ROCURONIUM 10MG/ML (10ML) SYRINGE FOR MEDFUSION PUMP - OPTIME
INTRAVENOUS | Status: DC | PRN
  Administered 2023-10-01: 100 mg via INTRAVENOUS

## 2023-10-01 MED ORDER — DEXMEDETOMIDINE HCL IN NACL 80 MCG/20ML IV SOLN
INTRAVENOUS | Status: DC | PRN
  Administered 2023-10-01 (×2): 10 ug via INTRAVENOUS

## 2023-10-01 MED ORDER — AMLODIPINE BESYLATE 10 MG PO TABS
5.0000 mg | ORAL_TABLET | Freq: Every day | ORAL | Status: DC
  Administered 2023-10-01: 5 mg via ORAL
  Filled 2023-10-01: qty 1

## 2023-10-01 MED ORDER — SUGAMMADEX SODIUM 200 MG/2ML IV SOLN
INTRAVENOUS | Status: DC | PRN
  Administered 2023-10-01: 200 mg via INTRAVENOUS

## 2023-10-01 MED ORDER — ORAL CARE MOUTH RINSE: 15.0000 mL | Freq: Once | OROMUCOSAL | Status: AC

## 2023-10-01 MED ORDER — HYDROMORPHONE HCL 1 MG/ML IJ SOLN
INTRAMUSCULAR | Status: AC
  Filled 2023-10-01: qty 1

## 2023-10-01 MED ORDER — PHENYLEPHRINE HCL (PRESSORS) 10 MG/ML IV SOLN
INTRAVENOUS | Status: DC | PRN
  Administered 2023-10-01: 160 ug via INTRAVENOUS

## 2023-10-01 MED ORDER — PROPOFOL 10 MG/ML IV BOLUS
INTRAVENOUS | Status: AC
  Filled 2023-10-01: qty 20

## 2023-10-01 MED ORDER — DIPHENHYDRAMINE HCL 12.5 MG/5ML PO ELIX: 12.5000 mg | ORAL_SOLUTION | Freq: Four times a day (QID) | ORAL | Status: DC | PRN

## 2023-10-01 MED ORDER — PROPOFOL 10 MG/ML IV BOLUS
INTRAVENOUS | Status: DC | PRN
Start: 2023-10-01 — End: 2023-10-01
  Administered 2023-10-01: 150 mg via INTRAVENOUS
  Administered 2023-10-01: 50 mg via INTRAVENOUS

## 2023-10-01 MED ORDER — HYOSCYAMINE SULFATE 0.125 MG SL SUBL: 0.1250 mg | SUBLINGUAL_TABLET | SUBLINGUAL | Status: DC | PRN

## 2023-10-01 MED ORDER — MIDAZOLAM HCL 2 MG/2ML IJ SOLN
INTRAMUSCULAR | Status: AC
  Filled 2023-10-01: qty 2

## 2023-10-01 MED ORDER — BUPIVACAINE LIPOSOME 1.3 % IJ SUSP
INTRAMUSCULAR | Status: DC | PRN
  Administered 2023-10-01: 20 mL

## 2023-10-01 MED ORDER — UMECLIDINIUM BROMIDE 62.5 MCG/ACT IN AEPB
1.0000 | INHALATION_SPRAY | Freq: Every day | RESPIRATORY_TRACT | Status: DC
  Administered 2023-10-01: 1 via RESPIRATORY_TRACT
  Filled 2023-10-01: qty 7

## 2023-10-01 MED ORDER — OXYCODONE HCL 5 MG/5ML PO SOLN: 5.0000 mg | Freq: Once | ORAL | Status: DC | PRN

## 2023-10-01 MED ORDER — ARFORMOTEROL TARTRATE 15 MCG/2ML IN NEBU
15.0000 ug | INHALATION_SOLUTION | Freq: Two times a day (BID) | RESPIRATORY_TRACT | Status: DC
  Administered 2023-10-01 – 2023-10-02 (×2): 15 ug via RESPIRATORY_TRACT
  Filled 2023-10-01 (×2): qty 2

## 2023-10-01 MED ORDER — ONDANSETRON HCL 4 MG/2ML IJ SOLN
INTRAMUSCULAR | Status: DC | PRN
  Administered 2023-10-01: 4 mg via INTRAVENOUS

## 2023-10-01 MED ORDER — MIDAZOLAM HCL 2 MG/2ML IJ SOLN
INTRAMUSCULAR | Status: DC | PRN
  Administered 2023-10-01: 2 mg via INTRAVENOUS

## 2023-10-01 MED ORDER — OXYCODONE HCL 5 MG PO TABS
5.0000 mg | ORAL_TABLET | ORAL | Status: DC | PRN
  Administered 2023-10-02: 5 mg via ORAL
  Filled 2023-10-01: qty 1

## 2023-10-01 MED ORDER — OXYCODONE HCL 5 MG PO TABS: 5.0000 mg | ORAL_TABLET | Freq: Once | ORAL | Status: DC | PRN

## 2023-10-01 MED ORDER — LIDOCAINE 2% (20 MG/ML) 5 ML SYRINGE
INTRAMUSCULAR | Status: DC | PRN
  Administered 2023-10-01: 100 mg via INTRAVENOUS

## 2023-10-01 MED ORDER — LACTATED RINGERS IV SOLN: INTRAVENOUS | Status: DC

## 2023-10-01 MED ORDER — ONDANSETRON HCL 4 MG/2ML IJ SOLN: 4.0000 mg | INTRAMUSCULAR | Status: DC | PRN

## 2023-10-01 MED ORDER — SODIUM CHLORIDE (PF) 0.9 % IJ SOLN
INTRAMUSCULAR | Status: AC
  Filled 2023-10-01: qty 20

## 2023-10-01 MED ORDER — SODIUM CHLORIDE 0.9 % IV SOLN: 12.5000 mg | INTRAVENOUS | Status: DC | PRN

## 2023-10-01 MED ORDER — HYDROMORPHONE HCL 1 MG/ML IJ SOLN
INTRAMUSCULAR | Status: DC | PRN
  Administered 2023-10-01 (×2): 1 mg via INTRAVENOUS

## 2023-10-01 MED ORDER — HYDROMORPHONE HCL 1 MG/ML IJ SOLN
0.5000 mg | INTRAMUSCULAR | Status: DC | PRN
  Administered 2023-10-01 – 2023-10-02 (×2): 1 mg via INTRAVENOUS
  Filled 2023-10-01 (×2): qty 1

## 2023-10-01 MED ORDER — DOCUSATE SODIUM 100 MG PO CAPS: 100.0000 mg | ORAL_CAPSULE | Freq: Two times a day (BID) | ORAL | Status: AC

## 2023-10-01 MED ORDER — FENTANYL CITRATE (PF) 250 MCG/5ML IJ SOLN
INTRAMUSCULAR | Status: DC | PRN
  Administered 2023-10-01: 50 ug via INTRAVENOUS
  Administered 2023-10-01 (×2): 100 ug via INTRAVENOUS

## 2023-10-01 MED ORDER — KETAMINE HCL 10 MG/ML IJ SOLN
INTRAMUSCULAR | Status: DC | PRN
Start: 2023-10-01 — End: 2023-10-01
  Administered 2023-10-01: 25 mg via INTRAVENOUS

## 2023-10-01 MED ORDER — LIDOCAINE HCL (PF) 2 % IJ SOLN
INTRAMUSCULAR | Status: AC
Start: 2023-10-01 — End: 2023-10-01
  Filled 2023-10-01: qty 5

## 2023-10-01 MED ORDER — DEXAMETHASONE SODIUM PHOSPHATE 10 MG/ML IJ SOLN
INTRAMUSCULAR | Status: AC
  Filled 2023-10-01: qty 1

## 2023-10-01 MED ORDER — BUPIVACAINE LIPOSOME 1.3 % IJ SUSP
INTRAMUSCULAR | Status: AC
  Filled 2023-10-01: qty 20

## 2023-10-01 MED ORDER — HYDROMORPHONE HCL 1 MG/ML IJ SOLN
0.2500 mg | INTRAMUSCULAR | Status: DC | PRN
  Administered 2023-10-01 (×2): 0.5 mg via INTRAVENOUS

## 2023-10-01 SURGICAL SUPPLY — 54 items
APPLICATOR SURGIFLO ENDO (HEMOSTASIS) IMPLANT
CAUTERY HOOK MNPLR 1.6 DVNC XI (INSTRUMENTS) ×1 IMPLANT
CHLORAPREP W/TINT 26 (MISCELLANEOUS) ×1 IMPLANT
CLIP LIGATING HEM O LOK PURPLE (MISCELLANEOUS) ×1 IMPLANT
CLIP LIGATING HEMO LOK XL GOLD (MISCELLANEOUS) ×1 IMPLANT
CLIP LIGATING HEMO O LOK GREEN (MISCELLANEOUS) ×1 IMPLANT
COVER SURGICAL LIGHT HANDLE (MISCELLANEOUS) ×1 IMPLANT
COVER TIP SHEARS 8 DVNC (MISCELLANEOUS) ×1 IMPLANT
DERMABOND ADVANCED .7 DNX12 (GAUZE/BANDAGES/DRESSINGS) ×1 IMPLANT
DRAIN CHANNEL 15F RND FF 3/16 (WOUND CARE) IMPLANT
DRAPE ARM DVNC X/XI (DISPOSABLE) ×4 IMPLANT
DRAPE COLUMN DVNC XI (DISPOSABLE) ×1 IMPLANT
DRAPE INCISE IOBAN 66X45 STRL (DRAPES) ×1 IMPLANT
DRAPE SHEET LG 3/4 BI-LAMINATE (DRAPES) ×1 IMPLANT
DRIVER NDL LRG 8 DVNC XI (INSTRUMENTS) ×2 IMPLANT
DRIVER NDLE LRG 8 DVNC XI (INSTRUMENTS) ×2 IMPLANT
ELECT REM PT RETURN 15FT ADLT (MISCELLANEOUS) ×1 IMPLANT
EVACUATOR SILICONE 100CC (DRAIN) IMPLANT
FORCEPS PROGRASP DVNC XI (FORCEP) ×2 IMPLANT
GLOVE BIO SURGEON STRL SZ 6.5 (GLOVE) ×1 IMPLANT
GLOVE BIOGEL PI IND STRL 8 (GLOVE) ×1 IMPLANT
GLOVE SURG LX STRL 8.0 MICRO (GLOVE) ×2 IMPLANT
GOWN STRL REUS W/ TWL XL LVL3 (GOWN DISPOSABLE) ×2 IMPLANT
GOWN STRL SURGICAL XL XLNG (GOWN DISPOSABLE) ×1 IMPLANT
HOLDER FOLEY CATH W/STRAP (MISCELLANEOUS) ×1 IMPLANT
IRRIGATION SUCT STRKRFLW 2 WTP (MISCELLANEOUS) ×1 IMPLANT
KIT BASIN OR (CUSTOM PROCEDURE TRAY) ×1 IMPLANT
KIT TURNOVER KIT A (KITS) ×1 IMPLANT
NDL INSUFFLATION 14GA 120MM (NEEDLE) ×1 IMPLANT
NEEDLE INSUFFLATION 14GA 120MM (NEEDLE) ×1 IMPLANT
PROTECTOR NERVE ULNAR (MISCELLANEOUS) ×2 IMPLANT
RELOAD STAPLE 60 2.6 WHT THN (STAPLE) IMPLANT
SCISSORS LAP 5X45 EPIX DISP (ENDOMECHANICALS) IMPLANT
SCISSORS MNPLR CVD DVNC XI (INSTRUMENTS) ×1 IMPLANT
SEAL UNIV 5-12 XI (MISCELLANEOUS) ×4 IMPLANT
SEALER VESSEL EXT DVNC XI (MISCELLANEOUS) IMPLANT
SET TUBE SMOKE EVAC HIGH FLOW (TUBING) ×1 IMPLANT
SOLUTION ELECTROSURG ANTI STCK (MISCELLANEOUS) ×1 IMPLANT
SPIKE FLUID TRANSFER (MISCELLANEOUS) ×1 IMPLANT
STAPLE ECHEON FLEX 60 POW ENDO (STAPLE) IMPLANT
SURGIFLO W/THROMBIN 8M KIT (HEMOSTASIS) IMPLANT
SUT ETHILON 3 0 PS 1 (SUTURE) IMPLANT
SUT MNCRL AB 4-0 PS2 18 (SUTURE) ×2 IMPLANT
SUT PDS AB 0 CT1 36 (SUTURE) IMPLANT
SUT VIC AB 0 CT1 27XBRD ANTBC (SUTURE) IMPLANT
SUT VIC AB 2-0 SH 27X BRD (SUTURE) IMPLANT
SUT VICRYL 0 UR6 27IN ABS (SUTURE) IMPLANT
SYSTEM BAG RETRIEVAL 10MM (BASKET) ×1 IMPLANT
TOWEL OR 17X26 10 PK STRL BLUE (TOWEL DISPOSABLE) ×1 IMPLANT
TRAY FOLEY MTR SLVR 16FR STAT (SET/KITS/TRAYS/PACK) ×1 IMPLANT
TRAY LAPAROSCOPIC (CUSTOM PROCEDURE TRAY) ×1 IMPLANT
TROCAR Z THREAD OPTICAL 12X100 (TROCAR) ×1 IMPLANT
TROCAR Z-THREAD FIOS 5X100MM (TROCAR) IMPLANT
WATER STERILE IRR 1000ML POUR (IV SOLUTION) ×1 IMPLANT

## 2023-10-01 NOTE — Transfer of Care (Signed)
 Immediate Anesthesia Transfer of Care Note  Patient: Dakota Reid  Procedure(s) Performed: ADRENALECTOMY, ROBOT-ASSISTED (Left: Abdomen)  Patient Location: PACU  Anesthesia Type:General  Level of Consciousness: drowsy and patient cooperative  Airway & Oxygen Therapy: Patient Spontanous Breathing  Post-op Assessment: Report given to RN and Post -op Vital signs reviewed and stable  Post vital signs: Reviewed and stable  Last Vitals:  Vitals Value Taken Time  BP    Temp    Pulse 85 10/01/23 14:29  Resp 13 10/01/23 14:29  SpO2 93 % 10/01/23 14:29  Vitals shown include unfiled device data.  Last Pain:  Vitals:   10/01/23 1026  TempSrc:   PainSc: 0-No pain         Complications: No notable events documented.

## 2023-10-01 NOTE — Anesthesia Procedure Notes (Signed)
 Procedure Name: Intubation Date/Time: 10/01/2023 12:12 PM  Performed by: Nanci Riis, CRNAPre-anesthesia Checklist: Emergency Drugs available, Suction available, Patient being monitored, Timeout performed and Patient identified Patient Re-evaluated:Patient Re-evaluated prior to induction Oxygen Delivery Method: Circle system utilized Preoxygenation: Pre-oxygenation with 100% oxygen Induction Type: IV induction Ventilation: Mask ventilation without difficulty Laryngoscope Size: Miller and 3 Grade View: Grade I Tube type: Oral Tube size: 7.5 mm Number of attempts: 1 Airway Equipment and Method: Stylet Placement Confirmation: ETT inserted through vocal cords under direct vision, positive ETCO2 and breath sounds checked- equal and bilateral Secured at: 22 cm Tube secured with: Tape Dental Injury: Teeth and Oropharynx as per pre-operative assessment

## 2023-10-01 NOTE — Op Note (Signed)
 Operative Note  Preoperative diagnosis:  1.  13 mm left adrenal nodule 2.  History of pT1b clear-cell renal cell carcinoma involving the right kidney, s/p robotic radical nephrectomy in 2023  Postoperative diagnosis: 1.  13 mm left adrenal nodule 2.  History of pT1b clear-cell renal cell carcinoma involving the right kidney, s/p robotic radical nephrectomy in 2023  Procedure(s): 1.  Robot-assisted laparoscopic left adrenalectomy  Surgeon: Lonni Han, MD  Assistants: Alan Hammonds, PA-C An assistant was required for this surgical procedure.  The duties of the assistant included but were not limited to suctioning, passing suture, camera manipulation, retraction.  This procedure would not be able to be performed without an Geophysicist/field seismologist.   Anesthesia:  General  Complications:  None  EBL: 30 mL  Specimens: 1.  Left adrenal gland  Drains/Catheters: 1.  Foley catheter  Intraoperative findings:   Grossly negative margins following excision of left adrenal gland  Indication:  Dakota Reid is a 52 y.o. male with a history of pT1b clear-cell renal cell carcinoma involving the right kidney, s/p right (adrenal sparing) robotic radical nephrectomy in 2023.  He was found to have an indeterminant, but metabolically active 13 mm left adrenal nodule on surveillance imaging concerning for RCC metastasis vs adrenal primary and is here today for adrenalectomy.  He has been consented for the above procedures, voices understanding and wishes to proceed.  Description of procedure:  After informed consent was obtained, the patient was brought to the operating room and general endotracheal anesthesia was administered.  The patient was then placed in the right lateral decubitus position and prepped and draped in usual sterile fashion.  A timeout was performed.  An 8 mm incision was then made lateral to the left rectus muscle at the level of the left 12th rib.  Abdominal access was obtained via a Veress  needle.  The abdominal cavity was then insufflated up to 15 mmHg.  An 8 mm port was then introduced into the abdominal cavity.  Inspection of the port entry site by the robotic camera revealed no adjacent organ injury.  We then placed 3 additional 8 mm robotic ports to triangulate the left renal hilum.  A 12 mm assistant port was then placed between the carmera port and 3rd robotic arm.  The white line of Toldt along the descending colon was incised sharply and the colon, along with its mesocolonic fat, was reflected medially until the aorta was identified.  We then made a small window adjacent to the lower pole of the left kidney, identifying the left psoas muscle, left ureter and left gonadal vein.  The left ureter and gonadal vein were then reflected anteriorly allowing us  to then incised the perihilar attachments using electrocautery.  The left adrenal vein and artery were identified and ligated with Hem-o-lok clips then sharply incised.  I then dissected through the perinephric fat to the anterior medial portion of the kidney and a wide excision of the perinephric fat and adrenal gland was then performed using electrocautery.  Once the adrenal gland was detached, it was placed in an Endo Catch bag, to be retrieved at the conclusion of the case.  The robot was then de-docked.  His midline assistant port incision was then extended approximately 4 cm i superiorly and inferiorly and the specimen was extracted within the Endo Catch bag.  The midline incision was then closed using a series of 0 PDS sutures, in a running fashion.  All skin incisions were then closed using 4-0 Monocryl  and then dressed with Dermabond.  The patient tolerated the procedure well and was transferred to the postanesthesia in stable condition.    Plan: Monitor on the floor overnight

## 2023-10-01 NOTE — Discharge Instructions (Signed)

## 2023-10-01 NOTE — Plan of Care (Signed)
  Problem: Education: Goal: Knowledge of General Education information will improve Description: Including pain rating scale, medication(s)/side effects and non-pharmacologic comfort measures Outcome: Progressing   Problem: Health Behavior/Discharge Planning: Goal: Ability to manage health-related needs will improve Outcome: Progressing   Problem: Clinical Measurements: Goal: Ability to maintain clinical measurements within normal limits will improve Outcome: Progressing Goal: Will remain free from infection Outcome: Progressing Goal: Diagnostic test results will improve Outcome: Progressing Goal: Respiratory complications will improve Outcome: Progressing Goal: Cardiovascular complication will be avoided Outcome: Progressing   Problem: Activity: Goal: Risk for activity intolerance will decrease Outcome: Progressing   Problem: Nutrition: Goal: Adequate nutrition will be maintained Outcome: Progressing   Problem: Coping: Goal: Level of anxiety will decrease Outcome: Progressing   Problem: Elimination: Goal: Will not experience complications related to bowel motility Outcome: Progressing Goal: Will not experience complications related to urinary retention Outcome: Progressing   Problem: Pain Managment: Goal: General experience of comfort will improve and/or be controlled Outcome: Progressing   Problem: Safety: Goal: Ability to remain free from injury will improve Outcome: Progressing   Problem: Skin Integrity: Goal: Risk for impaired skin integrity will decrease Outcome: Progressing   Problem: Education: Goal: Knowledge of the prescribed therapeutic regimen will improve Outcome: Progressing   Problem: Bowel/Gastric: Goal: Gastrointestinal status for postoperative course will improve Outcome: Progressing   Problem: Clinical Measurements: Goal: Postoperative complications will be avoided or minimized Outcome: Progressing   Problem: Respiratory: Goal:  Ability to achieve and maintain a regular respiratory rate will improve Outcome: Progressing   Problem: Skin Integrity: Goal: Demonstration of wound healing without infection will improve Outcome: Progressing   Problem: Urinary Elimination: Goal: Ability to avoid or minimize complications of infection will improve Outcome: Progressing Goal: Ability to achieve and maintain urine output will improve Outcome: Progressing

## 2023-10-01 NOTE — Anesthesia Postprocedure Evaluation (Signed)
 Anesthesia Post Note  Patient: Dakota Reid  Procedure(s) Performed: ADRENALECTOMY, ROBOT-ASSISTED (Left: Abdomen)     Patient location during evaluation: PACU Anesthesia Type: General Level of consciousness: awake and alert, oriented and patient cooperative Pain management: pain level controlled Vital Signs Assessment: post-procedure vital signs reviewed and stable Respiratory status: spontaneous breathing, nonlabored ventilation and respiratory function stable Cardiovascular status: blood pressure returned to baseline and stable Postop Assessment: no apparent nausea or vomiting Anesthetic complications: no   No notable events documented.  Last Vitals:  Vitals:   10/01/23 1445 10/01/23 1500  BP: (!) 141/107 131/83  Pulse: 70 76  Resp: 10 13  Temp:    SpO2: 95% 95%    Last Pain:  Vitals:   10/01/23 1500  TempSrc:   PainSc: 7                  Almarie CHRISTELLA Marchi

## 2023-10-02 ENCOUNTER — Encounter (HOSPITAL_COMMUNITY): Payer: Self-pay | Admitting: Urology

## 2023-10-02 DIAGNOSIS — E278 Other specified disorders of adrenal gland: Secondary | ICD-10-CM | POA: Diagnosis not present

## 2023-10-02 LAB — BASIC METABOLIC PANEL WITH GFR
Anion gap: 12 (ref 5–15)
BUN: 16 mg/dL (ref 6–20)
CO2: 22 mmol/L (ref 22–32)
Calcium: 9.1 mg/dL (ref 8.9–10.3)
Chloride: 101 mmol/L (ref 98–111)
Creatinine, Ser: 1.52 mg/dL — ABNORMAL HIGH (ref 0.61–1.24)
GFR, Estimated: 55 mL/min — ABNORMAL LOW (ref >60)
Glucose, Bld: 149 mg/dL — ABNORMAL HIGH (ref 70–99)
Potassium: 3.9 mmol/L (ref 3.5–5.1)
Sodium: 135 mmol/L (ref 135–145)

## 2023-10-02 LAB — HEMOGLOBIN AND HEMATOCRIT, BLOOD
HCT: 37 % — ABNORMAL LOW (ref 39.0–52.0)
Hemoglobin: 12.6 g/dL — ABNORMAL LOW (ref 13.0–17.0)

## 2023-10-02 MED ORDER — CEPHALEXIN 500 MG PO CAPS: 500.0000 mg | ORAL_CAPSULE | Freq: Four times a day (QID) | ORAL | 0 refills | Status: DC

## 2023-10-02 NOTE — Progress Notes (Signed)
 Patient to be discharged to home today. All discharge Instructions including all discharge Medications and schedule for these reviewed with the Patient and Patient's Wife. Understanding verbalized of all discharge instructions/teaching and discharge AVS with the Patient at discharge.

## 2023-10-02 NOTE — Progress Notes (Signed)
 Patient ID: Dakota Reid, male   DOB: 1970/05/28, 53 y.o.   MRN: [id number]  1 Day Post-Op Subjective: Doing well.  Complains of some shoulder discomfort and hiccups. Passing flatus.  Pain controlled.  Objective: Vital signs in last 24 hours: Temp:  [97.5 F (36.4 C)-98.8 F (37.1 C)] 98 F (36.7 C) (07/19 0457) Pulse Rate:  [64-100] 75 (07/19 0457) Resp:  [7-23] 18 (07/19 0457) BP: (116-169)/(80-109) 127/80 (07/19 0457) SpO2:  [95 %-100 %] 98 % (07/19 0741) Weight:  [113.4 kg] 113.4 kg (07/18 1026)  Intake/Output from previous day: 07/18 0701 - 07/19 0700 In: 2865.2 [P.O.:300; I.V.:2565.2] Out: 5125 [Urine:5100; Blood:25] Intake/Output this shift: No intake/output data recorded.  Physical Exam:  General: Alert and oriented Abd: Soft, ND Inc: C/D/I Ext: NT  Lab Results: Recent Labs    09/30/23 1032 10/01/23 1438 10/02/23 0403  HGB 14.4 13.9 12.6*  HCT 41.4 41.0 37.0*   BMET Recent Labs    09/30/23 1032 10/02/23 0403  NA 139 135  K 4.4 3.9  CL 104 101  CO2 23 22  GLUCOSE 116* 149*  BUN 15 16  CREATININE 1.42* 1.52*  CALCIUM  10.0 9.1     Studies/Results: No results found.  Assessment/Plan: POD # 1 s/p RAL adrenalectomy - SL IVF - Ambulate - Advance diet - D/C Foley - Plan for d/c home later today   LOS: 0 days   Noretta Ferrara 10/02/2023, 8:48 AM

## 2023-10-02 NOTE — Discharge Summary (Signed)
 Date of admission: 10/01/2023  Date of discharge: 10/02/2023  Admission diagnosis: Renal tumor  Discharge diagnosis: Renal tumor  Secondary diagnoses: Diabetes  History and Physical: For full details, please see admission history and physical. Briefly, Dakota Reid is a 53 y.o. year old patient with a renal tumor.  Hospital Course: He underwent a robotic assisted laparoscopic nephrectomy on 10/01/23.   He tolerated this well and was stable for discharge on POD#1.  Laboratory values:  Recent Labs    09/30/23 1032 10/01/23 1438 10/02/23 0403  HGB 14.4 13.9 12.6*  HCT 41.4 41.0 37.0*   Recent Labs    09/30/23 1032 10/02/23 0403  CREATININE 1.42* 1.52*    Disposition: Home  Discharge instruction: The patient was instructed to be ambulatory but told to refrain from heavy lifting, strenuous activity, or driving.   Discharge medications:  Allergies as of 10/02/2023   No Known Allergies      Medication List     STOP taking these medications    magnesium oxide 400 (240 Mg) MG tablet Commonly known as: MAG-OX   multivitamin Tabs tablet       TAKE these medications    amLODipine  5 MG tablet Commonly known as: NORVASC  Take 5 mg by mouth at bedtime.   atorvastatin  20 MG tablet Commonly known as: LIPITOR TAKE 1 TABLET BY MOUTH DAILY AT  6 PM What changed:  when to take this additional instructions   cephALEXin  500 MG capsule Commonly known as: KEFLEX  Take 1 capsule (500 mg total) by mouth 4 (four) times daily.   cloNIDine  0.1 MG tablet Commonly known as: CATAPRES  Take 0.1 mg by mouth 2 (two) times daily.   docusate sodium  100 MG capsule Commonly known as: COLACE Take 1 capsule (100 mg total) by mouth 2 (two) times daily.   fenofibrate  160 MG tablet TAKE 1 TABLET BY MOUTH DAILY   fluticasone  50 MCG/ACT nasal spray Commonly known as: FLONASE  Place 2 sprays into both nostrils daily.   HYDROcodone -acetaminophen  5-325 MG tablet Commonly known as:  NORCO/VICODIN Take 1-2 tablets by mouth every 6 (six) hours as needed for moderate pain (pain score 4-6) or severe pain (pain score 7-10).   Stiolto Respimat  2.5-2.5 MCG/ACT Aers Generic drug: Tiotropium Bromide-Olodaterol Inhale 2 puffs into the lungs daily.        Followup:   Follow-up Information     Devere Lonni Righter, MD Follow up on 10/18/2023.   Specialty: Urology Why: at 2:00 Contact information: [address] [place] [address] [place] [address] [phone]

## 2023-10-06 LAB — SURGICAL PATHOLOGY

## 2023-10-07 ENCOUNTER — Other Ambulatory Visit: Payer: Self-pay

## 2023-10-07 DIAGNOSIS — D509 Iron deficiency anemia, unspecified: Secondary | ICD-10-CM

## 2023-10-14 ENCOUNTER — Inpatient Hospital Stay (HOSPITAL_BASED_OUTPATIENT_CLINIC_OR_DEPARTMENT_OTHER)

## 2023-10-14 ENCOUNTER — Inpatient Hospital Stay

## 2023-10-14 VITALS — BP 113/74 | HR 77 | Temp 97.7°F | Resp 20 | Wt 224.1 lb

## 2023-10-14 DIAGNOSIS — D509 Iron deficiency anemia, unspecified: Secondary | ICD-10-CM | POA: Diagnosis not present

## 2023-10-14 DIAGNOSIS — R918 Other nonspecific abnormal finding of lung field: Secondary | ICD-10-CM | POA: Diagnosis not present

## 2023-10-14 DIAGNOSIS — N1831 Chronic kidney disease, stage 3a: Secondary | ICD-10-CM | POA: Insufficient documentation

## 2023-10-14 DIAGNOSIS — C641 Malignant neoplasm of right kidney, except renal pelvis: Secondary | ICD-10-CM

## 2023-10-14 DIAGNOSIS — Z905 Acquired absence of kidney: Secondary | ICD-10-CM | POA: Insufficient documentation

## 2023-10-14 DIAGNOSIS — N183 Chronic kidney disease, stage 3 unspecified: Secondary | ICD-10-CM | POA: Insufficient documentation

## 2023-10-14 DIAGNOSIS — E278 Other specified disorders of adrenal gland: Secondary | ICD-10-CM | POA: Diagnosis present

## 2023-10-14 DIAGNOSIS — Z85528 Personal history of other malignant neoplasm of kidney: Secondary | ICD-10-CM | POA: Insufficient documentation

## 2023-10-14 LAB — CBC WITH DIFFERENTIAL (CANCER CENTER ONLY)
Abs Immature Granulocytes: 0.05 K/uL (ref 0.00–0.07)
Basophils Absolute: 0.1 K/uL (ref 0.0–0.1)
Basophils Relative: 1 %
Eosinophils Absolute: 0.4 K/uL (ref 0.0–0.5)
Eosinophils Relative: 6 %
HCT: 38.7 % — ABNORMAL LOW (ref 39.0–52.0)
Hemoglobin: 13.7 g/dL (ref 13.0–17.0)
Immature Granulocytes: 1 %
Lymphocytes Relative: 26 %
Lymphs Abs: 1.8 K/uL (ref 0.7–4.0)
MCH: 28.1 pg (ref 26.0–34.0)
MCHC: 35.4 g/dL (ref 30.0–36.0)
MCV: 79.3 fL — ABNORMAL LOW (ref 80.0–100.0)
Monocytes Absolute: 0.6 K/uL (ref 0.1–1.0)
Monocytes Relative: 9 %
Neutro Abs: 3.9 K/uL (ref 1.7–7.7)
Neutrophils Relative %: 57 %
Platelet Count: 285 K/uL (ref 150–400)
RBC: 4.88 MIL/uL (ref 4.22–5.81)
RDW: 12.1 % (ref 11.5–15.5)
WBC Count: 6.8 K/uL (ref 4.0–10.5)
nRBC: 0 % (ref 0.0–0.2)

## 2023-10-14 LAB — IRON AND IRON BINDING CAPACITY (CC-WL,HP ONLY)
Iron: 89 ug/dL (ref 45–182)
Saturation Ratios: 22 % (ref 17.9–39.5)
TIBC: 405 ug/dL (ref 250–450)
UIBC: 316 ug/dL (ref 117–376)

## 2023-10-14 LAB — FERRITIN: Ferritin: 272 ng/mL (ref 24–336)

## 2023-10-14 NOTE — Assessment & Plan Note (Addendum)
 Pending Ferritin, iron panel

## 2023-10-14 NOTE — Progress Notes (Signed)
 [facility] OFFICE PROGRESS NOTE  Patient Care Team: Mahlon Comer BRAVO, MD as PCP - General Arlana Arnt, MD as Consulting Physician (Otolaryngology) Margaret Eduard SAUNDERS, MD as Consulting Physician (Neurology)  Dakota Reid is a 52 y.o.male with history of melanoma, HTN, CKD3, pT1b 4.2 cm G2 Right clear cell RCC status post right nephrectomy in 02/2021, 13 pack year smoking history, being seen at [facility] for history of renal cell carcinoma and new left adrenal nodule.   First ccRCC on the right was on CT in 01/2021. Pathology showed grade 2, 4.2 cm. He likely has kidney cancer before age 89. Reasonable to obtain genetic testing.  New adrenal mass was resected and found to be cc renal cell carcinoma, was grade 2.  We discussed adjuvant immunotherapy versus surveillance.  Patient has grade 2 disease.  We discussed metastatic, or stage IV RCC is not considered curable but manageable.  1 can consider adjuvant immunotherapy plus resection.  They are potential side effects.  We discussed potential side effects of immunotherapy.  We discussed efficacy is higher with combination TKI/immunotherapy or IO/IO combination versus single agent IO.  Given he is already having stage IV disease, single agent IO may be an under treatment.  And there are potential toxicity.  Surveillance can be an option and continue local therapy such as SBRT to oligometastasis is also reasonable.  After discussion, he is comfortable continue with surveillance.  Recommend CT in October and follow-up with me.  Will obtain MRI of the brain now. Assessment & Plan Clear cell carcinoma of right kidney (HCC) Will continue long term monitoring, he also has lung nodules CT in 3 months CBC, CMP, LDH in ~ 3 months MRI of the brain for staging. Pulmonary nodules From last PET  6 mm nodule in the medial right lung base, on image 112. No specific abnormal uptake.  3 mm left lower lobe nodules also identified  on image 83 and is without uptake but again the lesions are small and the below threshold. The left-sided lesion has been present since 2022 demonstrating long-term stability.  The right-sided lesion is not seen previously. Continue surveillance CT in the future. CT in 3 months Stage 3a chronic kidney disease (HCC) S/p right nephrectomy for ccRCC Continue monitor Continue 60-70 oz fluid daily Microcytic anemia Pending Ferritin, iron panel  I spent a total of 41 minutes including review of chart and various tests results, face-to-face time with the patient, discussions about results, pathology, imaging, prognosis, treatment options, plan of care and coordination of care plan with other providers and staff members.  Orders Placed This Encounter  Procedures   CT Chest W Contrast    Standing Status:   Future    Expected Date:   01/07/2024    Expiration Date:   10/13/2024    If indicated for the ordered procedure, I authorize the administration of contrast media per Radiology protocol:   Yes    Does the patient have a contrast media/X-ray dye allergy?:   No    Preferred imaging location?:   [facility]   MR Brain W Wo Contrast    Standing Status:   Future    Expected Date:   10/28/2023    Expiration Date:   10/13/2024    If indicated for the ordered procedure, I authorize the administration of contrast media per Radiology protocol:   Yes    What is the patient's sedation requirement?:   No Sedation    Does the  patient have a pacemaker or implanted devices?:   No    Use SRS Protocol?:   Yes    Preferred imaging location?:   [facility] (table limit - 500lbs)   CT ABDOMEN PELVIS W WO CONTRAST    Standing Status:   Future    Expected Date:   01/07/2024    Expiration Date:   10/13/2024    If indicated for the ordered procedure, I authorize the administration of contrast media per Radiology protocol:   Yes    Does the patient have a contrast media/X-ray dye allergy?:   No     Preferred imaging location?:   [facility]    If indicated for the ordered procedure, I authorize the administration of oral contrast media per Radiology protocol:   Yes     Dakota JAYSON Chihuahua, MD  INTERVAL HISTORY: Patient returns for follow-up. Healing well from surgery.   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   Heterogeneously enhancing 4.1 x 3.4 cm RIGHT lower pole renal lesion on image 72/17, the lesion is partially exophytic but also extends within the lower pole renal sinus.   03/01/2021 Imaging   Postop CT  1. 6.3 x 2.7 by 5.5 cm thin walled low-density fluid collection in the right renal fossa, below the typical density of hematoma and probably representing a seroma or less likely abscess. 2. Trace free air in the right renal fossa, additional free intraperitoneal air scattered along the liver and right abdominal wall undersurface into the pelvis, likely postsurgical in etiology. Correlate clinically for perforated bowel. Follow-up as indicated. 3. Additional patchy soft tissue gas subcutaneously in the right abdominal wall and between the right abdominal wall muscular layers, could be normal postsurgical change or infectious process but no abscess is seen. 4. Minimal air in the bladder, could be due to recent instrumentation or infectious process. No bladder thickening. 5. Mildly prominent liver and spleen are mild liver steatosis. 6. Constipation and diverticulosis. 7. 3 mm left lower lobe nodule. One year follow-up chest CT recommended.   02/25/2022 Pathology Results   KIDNEY, RIGHT, NEPHRECTOMY:  Clear cell renal cell carcinoma, nuclear grade 2, size 4.2 cm  Tumor is limited to the kidney (pT1b)  Ureteral, vascular and all margins of resection are negative for tumor    06/30/2023  Imaging   CT from [facility]: New small LEFT adrenal nodule 13 mm compared to 07/02/2022 Right nephrectomy without evidence of local recurrence   07/23/2023 PET scan   PET No areas of abnormal radiotracer uptake identified.   The small right lower lobe lung nodule is seen today but does not have abnormal uptake. Lesion is small. Would recommend follow up surveillance in 3-6 months.   Left adrenal nodule also does not have abnormal uptake. This is new. An aggressive process is still possible. Next step in the workup to consider would be MRI with and without contrast and in/out of phase.   Previous right nephrectomy.  History of renal cell carcinoma   08/26/2023 Pathology Results   ADRENAL GLAND, LEFT, EXCISION:       Predominant blood clots.       No diagnostic material identified.    10/01/2023 Pathology Results   LEFT ADRENAL GLAND, ADRENALECTOMY:  Metastatic clear cell renal cell carcinoma, nuclear grade 2   positive for pax8, ca-ix,  negative for melan-A, inhibin and calretinin, the morphology and staining pattern support these cells are metastatic clear cell renal cell carcinoma.    10/14/2023 Cancer Staging   Staging form: Kidney, AJCC 8th Edition - Pathologic: Stage IV (pT1b, pN0, pM1) - Signed by Dakota Dakota BROCKS, MD on 10/14/2023 Histologic grade (G): G2 Histologic grading system: 4 grade system Residual tumor (R): R0      PHYSICAL EXAMINATION: ECOG PERFORMANCE STATUS: 0 - Asymptomatic  Vitals:   10/14/23 0838  BP: 113/74  Pulse: 77  Resp: 20  Temp: 97.7 F (36.5 C)  SpO2: 98%   Filed Weights   10/14/23 0838  Weight: 224 lb 1.6 oz (101.7 kg)   GENERAL: alert, no distress and comfortable LUNGS: clear to auscultation and percussion with normal breathing effort ABDOMEN: Incision sites dry.  No signs of infection NEURO: no focal motor/sensory deficits  Relevant data reviewed during this visit included labs and pathology and imaging.

## 2023-10-14 NOTE — Assessment & Plan Note (Addendum)
 S/p right nephrectomy for ccRCC Continue monitor Continue 60-70 oz fluid daily

## 2023-10-14 NOTE — Assessment & Plan Note (Addendum)
 From last PET  6 mm nodule in the medial right lung base, on image 112. No specific abnormal uptake.  3 mm left lower lobe nodules also identified on image 83 and is without uptake but again the lesions are small and the below threshold. The left-sided lesion has been present since 2022 demonstrating long-term stability.  The right-sided lesion is not seen previously. Continue surveillance CT in the future. CT in 3 months

## 2023-10-14 NOTE — Assessment & Plan Note (Addendum)
 Will continue long term monitoring, he also has lung nodules CT in 3 months CBC, CMP, LDH in ~ 3 months MRI of the brain for staging.

## 2023-10-20 ENCOUNTER — Other Ambulatory Visit: Payer: Self-pay | Admitting: Urology

## 2023-10-20 DIAGNOSIS — R911 Solitary pulmonary nodule: Secondary | ICD-10-CM

## 2023-10-21 ENCOUNTER — Encounter: Payer: Self-pay | Admitting: Urology

## 2023-10-21 ENCOUNTER — Ambulatory Visit (HOSPITAL_COMMUNITY)
Admission: RE | Admit: 2023-10-21 | Discharge: 2023-10-21 | Disposition: A | Discharge status: Home / Self Care | Source: Ambulatory Visit

## 2023-10-21 DIAGNOSIS — C641 Malignant neoplasm of right kidney, except renal pelvis: Secondary | ICD-10-CM | POA: Diagnosis present

## 2023-10-21 MED ORDER — GADOBUTROL 1 MMOL/ML IV SOLN
10.0000 mL | Freq: Once | INTRAVENOUS | Status: AC | PRN
  Administered 2023-10-21: 10 mL via INTRAVENOUS

## 2023-10-26 ENCOUNTER — Ambulatory Visit: Payer: Self-pay

## 2023-10-27 NOTE — Telephone Encounter (Signed)
 LM with message below. To call if any questions

## 2023-10-27 NOTE — Telephone Encounter (Signed)
-----   Message from [name] sent at 10/26/2023 10:49 PM EDT ----- Zorita please let him know good news. MRI was normal. Thank you ----- Message ----- From: Interface, Rad Results In Sent: 10/26/2023   3:08 PM EDT To: [name], MD

## 2023-12-06 ENCOUNTER — Inpatient Hospital Stay

## 2023-12-06 ENCOUNTER — Inpatient Hospital Stay: Admitting: Genetic Counselor

## 2023-12-06 ENCOUNTER — Other Ambulatory Visit

## 2023-12-06 ENCOUNTER — Encounter: Payer: Self-pay | Admitting: Genetic Counselor

## 2023-12-06 DIAGNOSIS — Z800 Family history of malignant neoplasm of digestive organs: Secondary | ICD-10-CM | POA: Diagnosis not present

## 2023-12-06 DIAGNOSIS — Z8042 Family history of malignant neoplasm of prostate: Secondary | ICD-10-CM

## 2023-12-06 DIAGNOSIS — C439 Malignant melanoma of skin, unspecified: Secondary | ICD-10-CM

## 2023-12-06 DIAGNOSIS — C641 Malignant neoplasm of right kidney, except renal pelvis: Secondary | ICD-10-CM | POA: Diagnosis not present

## 2023-12-06 LAB — GENETIC SCREENING ORDER

## 2023-12-06 NOTE — Progress Notes (Signed)
 REFERRING PROVIDER: Tina Pauletta BROCKS, MD   PRIMARY PROVIDER:  Mahlon Comer BRAVO, MD  PRIMARY REASON FOR VISIT:  1. Clear cell carcinoma of right kidney (HCC)   2. Family history of cholangiocarcinoma   3. Melanoma of skin (HCC)   4. Family history of malignant neoplasm of prostate     HISTORY OF PRESENT ILLNESS:   Dakota Reid, a 53 y.o. male, was seen for a [place] cancer genetics consultation at the request of Tina Pauletta BROCKS, MD due to a personal and family history of cancer.  Dakota Reid presents to clinic today to discuss the possibility of a hereditary predisposition to cancer, to discuss genetic testing, and to further clarify his future cancer risks, as well as potential cancer risks for family members.   In 2022, at the age of 29, Dakota Reid was diagnosed with clear cell carcinoma of the right kidney, metastatic to adrenal gland. He reports melanoma on his palm diagnosed at age 35.  CANCER HISTORY:  Oncology History  Clear cell carcinoma of right kidney (HCC)  01/21/2021 Initial Diagnosis   Clear cell carcinoma of right kidney (HCC)  MRI ABD Adrenals/Urinary Tract:  Bilateral adrenal glands are unremarkable.   Heterogeneously enhancing 4.1 x 3.4 cm RIGHT lower pole renal lesion on image 72/17, the lesion is partially exophytic but also extends within the lower pole renal sinus.   03/01/2021 Imaging   Postop CT  1. 6.3 x 2.7 by 5.5 cm thin walled low-density fluid collection in the right renal fossa, below the typical density of hematoma and probably representing a seroma or less likely abscess. 2. Trace free air in the right renal fossa, additional free intraperitoneal air scattered along the liver and right abdominal wall undersurface into the pelvis, likely postsurgical in etiology. Correlate clinically for perforated bowel. Follow-up as indicated. 3. Additional patchy soft tissue gas subcutaneously in the right abdominal wall and between the right abdominal wall  muscular layers, could be normal postsurgical change or infectious process but no abscess is seen. 4. Minimal air in the bladder, could be due to recent instrumentation or infectious process. No bladder thickening. 5. Mildly prominent liver and spleen are mild liver steatosis. 6. Constipation and diverticulosis. 7. 3 mm left lower lobe nodule. One year follow-up chest CT recommended.   02/25/2022 Pathology Results   KIDNEY, RIGHT, NEPHRECTOMY:  Clear cell renal cell carcinoma, nuclear grade 2, size 4.2 cm  Tumor is limited to the kidney (pT1b)  Ureteral, vascular and all margins of resection are negative for tumor    06/30/2023 Imaging   CT from [facility]: New small LEFT adrenal nodule 13 mm compared to 07/02/2022 Right nephrectomy without evidence of local recurrence   07/23/2023 PET scan   PET No areas of abnormal radiotracer uptake identified.   The small right lower lobe lung nodule is seen today but does not have abnormal uptake. Lesion is small. Would recommend follow up surveillance in 3-6 months.   Left adrenal nodule also does not have abnormal uptake. This is new. An aggressive process is still possible. Next step in the workup to consider would be MRI with and without contrast and in/out of phase.   Previous right nephrectomy.  History of renal cell carcinoma   08/26/2023 Pathology Results   ADRENAL GLAND, LEFT, EXCISION:       Predominant blood clots.       No diagnostic material identified.    10/01/2023 Pathology Results   LEFT ADRENAL GLAND, ADRENALECTOMY:  Metastatic clear  cell renal cell carcinoma, nuclear grade 2   positive for pax8, ca-ix, negative for melan-A, inhibin and calretinin, the morphology and staining pattern support these cells are metastatic clear cell renal cell carcinoma.    10/14/2023 Cancer Staging   Staging form: Kidney, AJCC 8th Edition - Pathologic: Stage IV (pT1b, pN0, pM1) - Signed by Tina Pauletta BROCKS, MD on 10/14/2023 Histologic grade (G):  G2 Histologic grading system: 4 grade system Residual tumor (R): R0      RISK FACTORS:  No history of colonoscopy, normal cologuard  Reports normal prostate cancer screening   Past Medical History:  Diagnosis Date   Anxiety    Cancer (HCC)    clear cell  renal cancer   Chronic kidney disease    CKD2-3   GERD (gastroesophageal reflux disease)    History of kidney stones    Hyperlipidemia    Hypertension    Sleep apnea    CPAP    Past Surgical History:  Procedure Laterality Date   ROBOT ASSISTED LAPAROSCOPIC NEPHRECTOMY Right 02/25/2021   Procedure: XI ROBOTIC ASSISTED LAPAROSCOPIC NEPHRECTOMY;  Surgeon: Devere Lonni Righter, MD;  Location: [facility];  Service: Urology;  Laterality: Right;   ROBOTIC ADRENALECTOMY Left 10/01/2023   Procedure: ADRENALECTOMY, ROBOT-ASSISTED;  Surgeon: Devere Lonni Righter, MD;  Location: [facility];  Service: Urology;  Laterality: Left;  LEFT ROBOTIC ADRENALECTOMY   WISDOM TOOTH EXTRACTION      Social History   Socioeconomic History   Marital status: Married    Spouse name: Not on file   Number of children: 2   Years of education: BA   Highest education level: Bachelor's degree (e.g., BA, AB, BS)  Occupational History    Employer: [employer]    Comment: Insurance Company  Tobacco Use   Smoking status: Former    Current packs/day: 0.00    Types: Cigarettes    Quit date: 03/17/2007    Years since quitting: 16.7    Passive exposure: Never   Smokeless tobacco: Never  Vaping Use   Vaping status: Never Used  Substance and Sexual Activity   Alcohol use: Not Currently   Drug use: No   Sexual activity: Not Currently    Birth control/protection: None  Other Topics Concern   Not on file  Social History Narrative   Married, twin girls.   Patient lives at home with his family.   Caffeine Use:    Social Drivers of Corporate investment banker Strain: Low Risk  (08/27/2023)   Overall Financial Resource Strain (CARDIA)     Difficulty of Paying Living Expenses: Not very hard  Food Insecurity: No Food Insecurity (10/01/2023)   Hunger Vital Sign    Worried About Running Out of Food in the Last Year: Never true    Ran Out of Food in the Last Year: Never true  Transportation Needs: No Transportation Needs (10/01/2023)   PRAPARE - Administrator, Civil Service (Medical): No    Lack of Transportation (Non-Medical): No  Physical Activity: Insufficiently Active (08/27/2023)   Exercise Vital Sign    Days of Exercise per Week: 1 day    Minutes of Exercise per Session: 30 min  Stress: No Stress Concern Present (08/27/2023)   [employer] of Occupational Health - Occupational Stress Questionnaire    Feeling of Stress: Only a little  Recent Concern: Stress - Stress Concern Present (06/24/2023)   [employer] of Occupational Health - Occupational Stress Questionnaire    Feeling of  Stress : To some extent  Social Connections: Socially Integrated (10/01/2023)   Social Connection and Isolation Panel    Frequency of Communication with Friends and Family: More than three times a week    Frequency of Social Gatherings with Friends and Family: Twice a week    Attends Religious Services: More than 4 times per year    Active Member of [employer] or Organizations: No    Attends Engineer, structural: More than 4 times per year    Marital Status: Married     FAMILY HISTORY:  We obtained a detailed, 4-generation family history.  Significant diagnoses are listed below: Family History  Problem Relation Age of Onset   Diabetes Mother    Cancer Mother        cholangiocarcinoma   Hypertension Father    Kidney Stones Father    Benign prostatic hyperplasia Father    Skin cancer Father    Liver cancer Maternal Grandmother    Prostate cancer Paternal Grandfather     Dakota Reid is unaware of previous family history of genetic testing for hereditary cancer risks. There is no reported Ashkenazi Jewish ancestry.      GENETIC COUNSELING ASSESSMENT: Dakota Reid is a 53 y.o. male with a personal and family history of cancer which is somewhat suggestive of a hereditary predisposition to cancer given his personal history of renal cell carcinoma and melanoma, and family history of two maternal relatives with solid tumors. We, therefore, discussed and recommended the following at today's visit.   DISCUSSION: We discussed that 5 - 10% of cancer is hereditary.  We discussed that testing is beneficial for several reasons including knowing how to follow individuals after completing their treatment, identifying whether potential treatment options  would be beneficial, and understanding if other family members could be at risk for cancer and allowing them to undergo genetic testing.   We reviewed the characteristics, features and inheritance patterns of hereditary cancer syndromes. We also discussed genetic testing, including the appropriate family members to test, the process of testing, insurance coverage and turn-around-time for results. We discussed the implications of a negative, positive, carrier and/or variant of uncertain significant result. We recommended Dakota Reid pursue genetic testing for a panel that includes genes associated with kidney and gastrointestinal cancer.   Dakota Reid  was offered a common hereditary cancer panel (40 genes) and an expanded pan-cancer panel (77genes). Dakota Reid was informed of the benefits and limitations of each panel, including that expanded pan-cancer panels contain genes that do not have clear management guidelines at this point in time.  We also discussed that as the number of genes included on a panel increases, the chances of variants of uncertain significance increases. After considering the benefits and limitations of each gene panel, Dakota Reid elected to have The CancerNext-Expanded gene panel offered by [facility] and includes sequencing, rearrangement, and RNA analysis for  the following 77 genes: AIP, ALK, APC, ATM, AXIN2, BAP1, BARD1, BMPR1A, BRCA1, BRCA2, BRIP1, CDC73, CDH1, CDK4, CDKN1B, CDKN2A, CEBPA, CHEK2, CTNNA1, DDX41, DICER1, EGFR, EPCAM, ETV6, FH, FLCN, GATA2, GREM1, HOXB13, KIT, LZTR1, MAX, MBD4, MEN1, MET, MITF, MLH1, MSH2, MSH3, MSH6, MUTYH, NF1, NF2, NTHL1, PALB2, PDGFRA, PHOX2B, PMS2, POLD1, POLE, POT1, PRKAR1A, PTCH1, PTEN, RAD51C, RAD51D, RB1, RET, RPS20, RUNX1, SDHA, SDHAF2, SDHB, SDHC, SDHD, SMAD4, SMARCA4, SMARCB1, SMARCE1, STK11, SUFU, TMEM127, TP53, TSC1, TSC2, VHL, WT1.   Based on Dakota Reid's personal and family history of cancer, he meets medical criteria for genetic testing. Despite that  he meets criteria, he may still have an out of pocket cost. We discussed that if his out of pocket cost for testing is over $100, the laboratory should contact them to discuss self-pay prices, patient pay assistance programs, if applicable, and other billing options.  We discussed that some people do not want to undergo genetic testing due to fear of genetic discrimination.  A federal law called the Genetic Information Non-Discrimination Act (GINA) of 2008 helps protect individuals against genetic discrimination based on their genetic test results.  It impacts both health insurance and employment.  With health insurance, it protects against increased premiums, being kicked off insurance or being forced to take a test in order to be insured.  For employment it protects against hiring, firing and promoting decisions based on genetic test results.  GINA does not apply to those in the [employer], those who work for companies with less than 15 employees, and new life insurance or long-term disability insurance policies.  Health status due to a cancer diagnosis is not protected under GINA.  PLAN: After considering the risks, benefits, and limitations, Dakota Reid provided informed consent to pursue genetic testing and the blood sample was sent to [facility] for  analysis of the CancerNext-Expanded +RNAinsight. Results should be available within approximately 2-3 weeks' time, at which point they will be disclosed by telephone to Dakota Reid, as will any additional recommendations warranted by these results. Dakota Reid will receive a summary of his genetic counseling visit and a copy of his results once available. This information will also be available in Epic.   Lastly, we encouraged Dakota Reid to remain in contact with cancer genetics annually so that we can continuously update the family history and inform him of any changes in cancer genetics and testing that may be of benefit for this family.   Dakota Reid questions were answered to his satisfaction today. Our contact information was provided should additional questions or concerns arise. Thank you for the referral and allowing us  to share in the care of your patient.   Burnard Ogren, MS, [facility] Licensed, Retail [email or web address] phone: [phone]   50 minutes were spent on the date of the encounter in service to the patient including preparation, face-to-face consultation, documentation and care coordination.  The patient was seen alone.  Drs. Gudena and/or Lanny were available to discuss this case as needed.   _______________________________________________________________________ For Office Staff:  Number of people involved in session: 1 Was an Intern/ student involved with case: yes, [employer] student Chiquita observed this visit with patient permission

## 2023-12-16 ENCOUNTER — Other Ambulatory Visit: Payer: Self-pay | Admitting: Family Medicine

## 2023-12-17 ENCOUNTER — Ambulatory Visit: Payer: Self-pay | Admitting: Genetic Counselor

## 2023-12-17 ENCOUNTER — Telehealth: Payer: Self-pay | Admitting: Genetic Counselor

## 2023-12-17 DIAGNOSIS — Z1379 Encounter for other screening for genetic and chromosomal anomalies: Secondary | ICD-10-CM | POA: Insufficient documentation

## 2023-12-17 NOTE — Telephone Encounter (Signed)
 LVM asking for call back to review results of genetic testing.

## 2023-12-17 NOTE — Telephone Encounter (Signed)
 I spoke to Mr. Heather to review results of genetic testing. He had genetic testing with [facility] CancerNext-Expanded +RNAinsight. 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 We discussed that we do not use VUS to make management decisions or identify at risk family members. Discussed that we do not know why he has cancer or why there is cancer in the family. It could be due to a different gene that we are not testing, or maybe our current technology may not be able to pick something up.  It will be important for him to keep in contact with genetics to keep up with whether additional testing may be needed. We will contact him if new information is learned about the VUS.   Please see counseling note for further detail on this result.

## 2023-12-21 NOTE — Progress Notes (Addendum)
 HPI:  [name] was previously seen in the [facility] due to a personal and family history of cancer and concerns regarding a hereditary predisposition to cancer. Please refer to our prior cancer genetics clinic note for more information regarding our discussion, assessment and recommendations, at the time. [name] recent genetic test results were disclosed to him, as were recommendations warranted by these results. These results and recommendations are discussed in more detail below.  Results were disclosed by telephone on 12/17/23.   CANCER HISTORY:  Oncology History  Clear cell carcinoma of right kidney (HCC)  01/21/2021 Initial Diagnosis   Clear cell carcinoma of right kidney (HCC)  MRI ABD Adrenals/Urinary Tract:  Bilateral adrenal glands are unremarkable.   Heterogeneously enhancing 4.1 x 3.4 cm RIGHT lower pole renal lesion on image 72/17, the lesion is partially exophytic but also extends within the lower pole renal sinus.   03/01/2021 Imaging   Postop CT  1. 6.3 x 2.7 by 5.5 cm thin walled low-density fluid collection in the right renal fossa, below the typical density of hematoma and probably representing a seroma or less likely abscess. 2. Trace free air in the right renal fossa, additional free intraperitoneal air scattered along the liver and right abdominal wall undersurface into the pelvis, likely postsurgical in etiology. Correlate clinically for perforated bowel. Follow-up as indicated. 3. Additional patchy soft tissue gas subcutaneously in the right abdominal wall and between the right abdominal wall muscular layers, could be normal postsurgical change or infectious process but no abscess is seen. 4. Minimal air in the bladder, could be due to recent instrumentation or infectious process. No bladder thickening. 5. Mildly prominent liver and spleen are mild liver steatosis. 6. Constipation and diverticulosis. 7. 3 mm left lower lobe nodule.  One year follow-up chest CT recommended.   02/25/2022 Pathology Results   KIDNEY, RIGHT, NEPHRECTOMY:  Clear cell renal cell carcinoma, nuclear grade 2, size 4.2 cm  Tumor is limited to the kidney (pT1b)  Ureteral, vascular and all margins of resection are negative for tumor    06/30/2023 Imaging   CT [facility]: New small LEFT adrenal nodule 13 mm compared to 07/02/2022 Right nephrectomy without evidence of local recurrence   07/23/2023 PET scan   PET No areas of abnormal radiotracer uptake identified.   The small right lower lobe lung nodule is seen today but does not have abnormal uptake. Lesion is small. Would recommend follow up surveillance in 3-6 months.   Left adrenal nodule also does not have abnormal uptake. This is new. An aggressive process is still possible. Next step in the workup to consider would be MRI with and without contrast and in/out of phase.   Previous right nephrectomy.  History of renal cell carcinoma   08/26/2023 Pathology Results   ADRENAL GLAND, LEFT, EXCISION:       Predominant blood clots.       No diagnostic material identified.    10/01/2023 Pathology Results   LEFT ADRENAL GLAND, ADRENALECTOMY:  Metastatic clear cell renal cell carcinoma, nuclear grade 2   positive for pax8, ca-ix, negative for melan-A, inhibin and calretinin, the morphology and staining pattern support these cells are metastatic clear cell renal cell carcinoma.    10/14/2023 Cancer Staging   Staging form: Kidney, AJCC 8th Edition - Pathologic: Stage IV (pT1b, pN0, pM1) - Signed by Tina Pauletta BROCKS, MD on 10/14/2023 Histologic grade (G): G2 Histologic grading system: 4 grade system Residual tumor (R): R0  12/13/2023 Genetic Testing   Negative CancerNext-Expanded +RNAinsight, VUS in APC, p.E2463D (c.7389A>C). The CancerNext-Expanded gene panel offered by [facility] and includes sequencing, rearrangement, and RNA analysis for the following 77 genes: AIP, ALK, APC, ATM, AXIN2,  BAP1, BARD1, BMPR1A, BRCA1, BRCA2, BRIP1, CDC73, CDH1, CDK4, CDKN1B, CDKN2A, CEBPA, CHEK2, CTNNA1, DDX41, DICER1, EGFR, EPCAM, ETV6, FH, FLCN, GATA2, GREM1, HOXB13, KIT, LZTR1, MAX, MBD4, MEN1, MET, MITF, MLH1, MSH2, MSH3, MSH6, MUTYH, NF1, NF2, NTHL1, PALB2, PDGFRA, PHOX2B, PMS2, POLD1, POLE, POT1, PRKAR1A, PTCH1, PTEN, RAD51C, RAD51D, RB1, RET, RPS20, RUNX1, SDHA, SDHAF2, SDHB, SDHC, SDHD, SMAD4, SMARCA4, SMARCB1, SMARCE1, STK11, SUFU, TMEM127, TP53, TSC1, TSC2, VHL, WT1. Report date 12/13/23.      FAMILY HISTORY:  We obtained a detailed, 4-generation family history.  Significant diagnoses are listed below: Family History  Problem Relation Age of Onset   Diabetes Mother    Cancer Mother        cholangiocarcinoma   Hypertension Father    Kidney Stones Father    Benign prostatic hyperplasia Father    Skin cancer Father    Liver cancer Maternal Grandmother    Prostate cancer Paternal Grandfather     [name] is unaware of previous family history of genetic testing for hereditary cancer risks. There is no reported Ashkenazi Jewish ancestry.      GENETIC TEST RESULTS: Genetic testing reported out on 12/13/23 through the [facility] CancerNext-Expanded +RNAinsight cancer panel found no pathogenic mutations. The CancerNext-Expanded gene panel offered by [facility] and includes sequencing, rearrangement, and RNA analysis for the following 77 genes: AIP, ALK, APC, ATM, AXIN2, BAP1, BARD1, BMPR1A, BRCA1, BRCA2, BRIP1, CDC73, CDH1, CDK4, CDKN1B, CDKN2A, CEBPA, CHEK2, CTNNA1, DDX41, DICER1, EGFR, EPCAM, ETV6, FH, FLCN, GATA2, GREM1, HOXB13, KIT, LZTR1, MAX, MBD4, MEN1, MET, MITF, MLH1, MSH2, MSH3, MSH6, MUTYH, NF1, NF2, NTHL1, PALB2, PDGFRA, PHOX2B, PMS2, POLD1, POLE, POT1, PRKAR1A, PTCH1, PTEN, RAD51C, RAD51D, RB1, RET, RPS20, RUNX1, SDHA, SDHAF2, SDHB, SDHC, SDHD, SMAD4, SMARCA4, SMARCB1, SMARCE1, STK11, SUFU, TMEM127, TP53, TSC1, TSC2, VHL, WT1. The test report has been scanned into EPIC and is located  under the Molecular Pathology section of the Results Review tab.  A portion of the result report is included below for reference.     We discussed with [name] that because current genetic testing is not perfect, it is possible there may be a gene mutation in one of these genes that current testing cannot detect, but that chance is small.  We also discussed, that there could be another gene that has not yet been discovered, or that we have not yet tested, that is responsible for the cancer diagnoses in the family. It is also possible there is a hereditary cause for the cancer in the family that [name] did not inherit and therefore was not identified in his testing.  Therefore, it is important to remain in touch with cancer genetics in the future so that we can continue to offer [name] the most up to date genetic testing.   Genetic testing did identify a variant of uncertain significance (VUS) was identified in the APC gene called c.7389A>C. At this time, it is unknown if this variant is associated with increased cancer risk or if this is a normal finding, but most variants such as this get reclassified to being inconsequential. It should not be used to make medical management decisions. With time, we suspect the lab will determine the significance of this variant, if any. If we do learn more about it, we will  try to contact [name] to discuss it further. However, it is important to stay in touch with us  periodically and keep the address and phone number up to date.  UPDATE: APC, p.E2463D (c.7389A>C) variant of unknown significance was reclassified to benign. Reclassified report date 02/26/24.   ADDITIONAL GENETIC TESTING: We discussed with [name] that his genetic testing was fairly extensive.  If there are genes identified to increase cancer risk that can be analyzed in the future, we would be happy to discuss and coordinate this testing at that time.    CANCER SCREENING RECOMMENDATIONS: Mr.  Dakota Reid test result is considered negative (normal).  This means that we have not identified a hereditary cause for his personal and family history of cancer at this time. Most cancers happen by chance and this negative test suggests that his personal and family history of cancer may fall into this category.    Possible reasons for [name]'s negative genetic test include:  1. There may be a gene mutation in one of these genes that current testing methods cannot detect. The likelihood of this is low, but possible.   2. There could be another gene that has not yet been discovered, or that we have not yet tested, that is responsible for the cancer diagnoses in the family.  3.  There may be no hereditary risk for cancer in the family. The cancers in [name] and/or his family may be sporadic/familial or due to other genetic and environmental factors. 4. It is also possible there is a hereditary cause for the cancer in the family that [name] did not inherit.  Therefore, it is recommended he continue to follow the cancer management and screening guidelines provided by his oncology and primary healthcare provider. An individual's cancer risk and medical management are not determined by genetic test results alone. Overall cancer risk assessment incorporates additional factors, including personal medical history, family history, and any available genetic information that may result in a personalized plan for cancer prevention and surveillance  Given [name]'s personal and family histories, we must interpret these negative results with some caution.  Families with features suggestive of hereditary risk for cancer tend to have multiple family members with cancer, diagnoses in multiple generations and diagnoses before the age of 42. [name] family exhibits some of these features. Thus, this result may simply reflect our current inability to detect all mutations within these genes or there may be a  different gene that has not yet been discovered or tested.   RECOMMENDATIONS FOR FAMILY MEMBERS:  Individuals in this family might be at some increased risk of developing cancer, over the general population risk, simply due to the family history of cancer.  We recommended women in this family have a yearly mammogram beginning at age 63, or 40 years younger than the earliest onset of cancer, an annual clinical breast exam, and perform monthly breast self-exams. Women in this family should also have a gynecological exam as recommended by their primary provider. All family members should be referred for colonoscopy starting at age 74, or 90 years younger than the earliest onset of cancer.  Other relatives may benefit from completing their own genetic testing, especially if they have been diagnosed with cancer.   FOLLOW-UP: Lastly, we discussed with [name] that cancer genetics is a rapidly advancing field and it is possible that new genetic tests will be appropriate for him and/or his family members in the future. We encouraged him to remain  in contact with cancer genetics on an annual basis so we can update his personal and family histories and let him know of advances in cancer genetics that may benefit this family.   Our contact number was provided. [name] questions were answered to his satisfaction, and he knows he is welcome to call us  at anytime with additional questions or concerns.   Burnard Ogren, MS, [facility] Licensed, [email or web address] [phone]

## 2023-12-24 ENCOUNTER — Encounter: Payer: Self-pay | Admitting: Family Medicine

## 2023-12-24 ENCOUNTER — Ambulatory Visit: Payer: Self-pay | Admitting: Family Medicine

## 2023-12-24 ENCOUNTER — Ambulatory Visit: Admitting: Family Medicine

## 2023-12-24 VITALS — BP 126/78 | HR 77 | Temp 98.1°F | Ht 68.75 in | Wt 223.0 lb

## 2023-12-24 DIAGNOSIS — Z0000 Encounter for general adult medical examination without abnormal findings: Secondary | ICD-10-CM

## 2023-12-24 DIAGNOSIS — Z125 Encounter for screening for malignant neoplasm of prostate: Secondary | ICD-10-CM | POA: Diagnosis not present

## 2023-12-24 DIAGNOSIS — R7309 Other abnormal glucose: Secondary | ICD-10-CM

## 2023-12-24 DIAGNOSIS — Z23 Encounter for immunization: Secondary | ICD-10-CM | POA: Diagnosis not present

## 2023-12-24 DIAGNOSIS — I10 Essential (primary) hypertension: Secondary | ICD-10-CM

## 2023-12-24 LAB — BASIC METABOLIC PANEL WITH GFR
BUN: 15 mg/dL (ref 6–23)
CO2: 26 meq/L (ref 19–32)
Calcium: 9.7 mg/dL (ref 8.4–10.5)
Chloride: 103 meq/L (ref 96–112)
Creatinine, Ser: 1.49 mg/dL (ref 0.40–1.50)
GFR: 54.15 mL/min — ABNORMAL LOW (ref >60.00)
Glucose, Bld: 131 mg/dL — ABNORMAL HIGH (ref 70–99)
Potassium: 4.6 meq/L (ref 3.5–5.1)
Sodium: 139 meq/L (ref 135–145)

## 2023-12-24 LAB — CBC WITH DIFFERENTIAL/PLATELET
Basophils Absolute: 0 K/uL (ref 0.0–0.1)
Basophils Relative: 0.6 % (ref 0.0–3.0)
Eosinophils Absolute: 0.3 K/uL (ref 0.0–0.7)
Eosinophils Relative: 4.2 % (ref 0.0–5.0)
HCT: 45.7 % (ref 39.0–52.0)
Hemoglobin: 15.3 g/dL (ref 13.0–17.0)
Lymphocytes Relative: 26.8 % (ref 12.0–46.0)
Lymphs Abs: 1.7 K/uL (ref 0.7–4.0)
MCHC: 33.6 g/dL (ref 30.0–36.0)
MCV: 82.6 fl (ref 78.0–100.0)
Monocytes Absolute: 0.6 K/uL (ref 0.1–1.0)
Monocytes Relative: 9.9 % (ref 3.0–12.0)
Neutro Abs: 3.8 K/uL (ref 1.4–7.7)
Neutrophils Relative %: 58.5 % (ref 43.0–77.0)
Platelets: 240 K/uL (ref 150.0–400.0)
RBC: 5.53 Mil/uL (ref 4.22–5.81)
RDW: 13.5 % (ref 11.5–15.5)
WBC: 6.5 K/uL (ref 4.0–10.5)

## 2023-12-24 LAB — TSH: TSH: 2.27 u[IU]/mL (ref 0.35–5.50)

## 2023-12-24 LAB — HEPATIC FUNCTION PANEL
ALT: 38 U/L (ref 0–53)
AST: 28 U/L (ref 0–37)
Albumin: 5.1 g/dL (ref 3.5–5.2)
Alkaline Phosphatase: 46 U/L (ref 39–117)
Bilirubin, Direct: 0.1 mg/dL (ref 0.0–0.3)
Total Bilirubin: 0.5 mg/dL (ref 0.2–1.2)
Total Protein: 7.6 g/dL (ref 6.0–8.3)

## 2023-12-24 LAB — PSA: PSA: 1.61 ng/mL (ref 0.10–4.00)

## 2023-12-24 LAB — LIPID PANEL
Cholesterol: 169 mg/dL (ref 0–200)
HDL: 40.6 mg/dL (ref >39.00)
LDL Cholesterol: 95 mg/dL (ref 0–99)
NonHDL: 128.18
Total CHOL/HDL Ratio: 4
Triglycerides: 167 mg/dL — ABNORMAL HIGH (ref 0.0–149.0)
VLDL: 33.4 mg/dL (ref 0.0–40.0)

## 2023-12-24 NOTE — Progress Notes (Signed)
   Subjective:    Patient ID: Dakota Reid, male    DOB: 06-24-1970, 53 y.o.   MRN: [id number]  HPI CPE- UTD on cologuard, Tdap.  Wants to get flu and PNA today.  Patient Care Team    Relationship Specialty Notifications Start End  Mahlon Comer BRAVO, MD PCP - General   02/26/10   Arlana Arnt, MD Consulting Physician Otolaryngology  11/01/14   Margaret Eduard SAUNDERS, MD Consulting Physician Neurology  11/01/14     Health Maintenance  Topic Date Due   Hepatitis B Vaccines 19-59 Average Risk (1 of 3 - 19+ 3-dose series) Never done   COVID-19 Vaccine (3 - Moderna risk series) 03/07/2024 (Originally 08/15/2019)   Fecal DNA (Cologuard)  01/08/2025   DTaP/Tdap/Td (3 - Td or Tdap) 12/12/2030   Pneumococcal Vaccine: 50+ Years  Completed   Influenza Vaccine  Completed   Hepatitis C Screening  Completed   HIV Screening  Completed   Zoster Vaccines- Shingrix   Completed   HPV VACCINES  Aged Out   Meningococcal B Vaccine  Aged Out      Review of Systems Patient reports no vision/hearing changes, anorexia, fever ,adenopathy, persistant/recurrent hoarseness, swallowing issues, chest pain, palpitations, edema, persistant/recurrent cough, hemoptysis, dyspnea (rest,exertional, paroxysmal nocturnal), gastrointestinal  bleeding (melena, rectal bleeding), abdominal pain, excessive heart burn, GU symptoms (dysuria, hematuria, voiding/incontinence issues) syncope, focal weakness, memory loss, numbness & tingling, hair/nail changes, depression, anxiety, abnormal bruising/bleeding, musculoskeletal symptoms/signs.   + 8 lb weight loss    Objective:   Physical Exam General Appearance:    Alert, cooperative, no distress, appears stated age, obese  Head:    Normocephalic, without obvious abnormality, atraumatic  Eyes:    PERRL, conjunctiva/corneas clear, EOM's intact both eyes       Ears:    Normal TM's and external ear canals, both ears  Nose:   Nares normal, septum midline, mucosa normal, no drainage   or  sinus tenderness  Throat:   Lips, mucosa, and tongue normal; teeth and gums normal  Neck:   Supple, symmetrical, trachea midline, no adenopathy;       thyroid :  No enlargement/tenderness/nodules  Back:     Symmetric, no curvature, ROM normal, no CVA tenderness  Lungs:     Clear to auscultation bilaterally, respirations unlabored  Chest wall:    No tenderness or deformity  Heart:    Regular rate and rhythm, S1 and S2 normal, no murmur, rub   or gallop  Abdomen:     Soft, non-tender, bowel sounds active all four quadrants,    no masses, no organomegaly  Genitalia:    deferred  Rectal:    Extremities:   Extremities normal, atraumatic, no cyanosis or edema  Pulses:   2+ and symmetric all extremities  Skin:   Skin color, texture, turgor normal, no rashes or lesions  Lymph nodes:   Cervical, supraclavicular, and axillary nodes normal  Neurologic:   CNII-XII intact. Normal strength, sensation and reflexes      throughout          Assessment & Plan:

## 2023-12-24 NOTE — Patient Instructions (Addendum)
Follow up in 6 months to recheck blood pressure and cholesterol We'll notify you of your lab results and make any changes if needed Continue to work on healthy diet and regular exercise- you look great! Call with any questions or concerns Stay Safe!  Stay Healthy! Happy Fall!!!

## 2023-12-24 NOTE — Assessment & Plan Note (Signed)
 Pt's PE WNL w/ exception of BMI.  UTD on cologuard, Tdap.  Flu and PNA given today.  Check labs.  Anticipatory guidance provided.

## 2023-12-27 ENCOUNTER — Ambulatory Visit (INDEPENDENT_AMBULATORY_CARE_PROVIDER_SITE_OTHER)

## 2023-12-27 ENCOUNTER — Ambulatory Visit: Payer: Self-pay | Admitting: Family Medicine

## 2023-12-27 DIAGNOSIS — R7309 Other abnormal glucose: Secondary | ICD-10-CM | POA: Diagnosis not present

## 2023-12-27 LAB — HEMOGLOBIN A1C: Hgb A1c MFr Bld: 6 % (ref 4.6–6.5)

## 2023-12-28 NOTE — Progress Notes (Signed)
 Pt has reviewed via MyChart

## 2024-01-04 ENCOUNTER — Ambulatory Visit (HOSPITAL_COMMUNITY)
Admission: RE | Admit: 2024-01-04 | Discharge: 2024-01-04 | Disposition: A | Discharge status: Home / Self Care | Source: Ambulatory Visit

## 2024-01-04 DIAGNOSIS — R918 Other nonspecific abnormal finding of lung field: Secondary | ICD-10-CM | POA: Insufficient documentation

## 2024-01-04 DIAGNOSIS — C641 Malignant neoplasm of right kidney, except renal pelvis: Secondary | ICD-10-CM

## 2024-01-04 MED ORDER — IOHEXOL 300 MG/ML  SOLN: 100.0000 mL | Freq: Once | INTRAMUSCULAR | Status: DC | PRN

## 2024-01-04 MED ORDER — SODIUM CHLORIDE (PF) 0.9 % IJ SOLN
INTRAMUSCULAR | Status: AC
  Filled 2024-01-04: qty 50

## 2024-01-04 MED ORDER — IOHEXOL 300 MG/ML  SOLN
100.0000 mL | Freq: Once | INTRAMUSCULAR | Status: AC | PRN
  Administered 2024-01-04: 100 mL via INTRAVENOUS

## 2024-01-10 NOTE — Progress Notes (Unsigned)
 [facility] OFFICE PROGRESS NOTE  Patient Care Team: Dakota Reid BRAVO, MD as PCP - General Dakota Arnt, MD as Consulting Physician (Otolaryngology) Dakota Eduard SAUNDERS, MD as Consulting Physician (Neurology)  Dakota Reid is a 53 y.o.male with history of melanoma, HTN, CKD3, pT1b 4.2 cm G2 Right clear cell RCC status post right nephrectomy in 02/2021, 13 pack year smoking history, [facility] for history of renal cell carcinoma and new left adrenal nodule.    First ccRCC on the right was on CT in 01/2021. Pathology showed grade 2, 4.2 cm. He likely has kidney cancer before age 24.  Genetic testing was negative.   Left adrenal mass was resected and found to be cc renal cell carcinoma, was grade 2.  Discussed risks and benefit of postoperative immunotherapy.  Given he has stage IV,  can also consider monitoring, if recurrent, systemic treatment with dual immunotherapy is more effective than single agent immunotherapy.  Repeat CT showed no evidence of recurrence.  Stable scattered small pulmonary nodules.  Diagnosis: metastatic ccRCC. G2. Genetic testing: Negative CancerNext-Expanded +RNAinsight, VUS in APC, p.E2463D (c.7389A>C).  Assessment & Plan Clear cell carcinoma of right kidney (HCC) 10/01/23 resected adrenal mass Will continue long term monitoring, he also has lung nodules CT in 4 months CBC, CMP, LDH in ~ 4 months Report new changes Pulmonary nodules Repeat CT in about 4 months  Orders Placed This Encounter  Procedures   CT CHEST ABDOMEN PELVIS W CONTRAST    Standing Status:   Future    Expiration Date:   05/09/2024    If indicated for the ordered procedure, I authorize the administration of contrast media per Radiology protocol:   Yes    Does the patient have a contrast media/X-ray dye allergy?:   No    Preferred imaging location?:   [facility]    If indicated for the ordered procedure, I authorize the administration of oral  contrast media per Radiology protocol:   Yes     Dakota JAYSON Chihuahua, MD  INTERVAL HISTORY: Patient returns for follow-up. Overall feeling well. No new cough, short of breath, abdominal pain, headaches or neurologic symptoms.  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   Heterogeneously enhancing 4.1 x 3.4 cm RIGHT lower pole renal lesion on image 72/17, the lesion is partially exophytic but also extends within the lower pole renal sinus.   03/01/2021 Imaging   Postop CT  1. 6.3 x 2.7 by 5.5 cm thin walled low-density fluid collection in the right renal fossa, below the typical density of hematoma and probably representing a seroma or less likely abscess. 2. Trace free air in the right renal fossa, additional free intraperitoneal air scattered along the liver and right abdominal wall undersurface into the pelvis, likely postsurgical in etiology. Correlate clinically for perforated bowel. Follow-up as indicated. 3. Additional patchy soft tissue gas subcutaneously in the right abdominal wall and between the right abdominal wall muscular layers, could be normal postsurgical change or infectious process but no abscess is seen. 4. Minimal air in the bladder, could be due to recent instrumentation or infectious process. No bladder thickening. 5. Mildly prominent liver and spleen are mild liver steatosis. 6. Constipation and diverticulosis. 7. 3 mm left lower lobe nodule. One year follow-up chest CT recommended.   02/25/2022 Pathology Results  KIDNEY, RIGHT, NEPHRECTOMY:  Clear cell renal cell carcinoma, nuclear grade 2, size 4.2 cm  Tumor is limited to the kidney (pT1b)  Ureteral, vascular and all margins of resection are negative for tumor    06/30/2023 Imaging   CT [facility]: New small LEFT adrenal nodule 13 mm  compared to 07/02/2022 Right nephrectomy without evidence of local recurrence   07/23/2023 PET scan   PET No areas of abnormal radiotracer uptake identified.   The small right lower lobe lung nodule is seen today but does not have abnormal uptake. Lesion is small. Would recommend follow up surveillance in 3-6 months.   Left adrenal nodule also does not have abnormal uptake. This is new. An aggressive process is still possible. Next step in the workup to consider would be MRI with and without contrast and in/out of phase.   Previous right nephrectomy.  History of renal cell carcinoma   08/26/2023 Pathology Results   ADRENAL GLAND, LEFT, EXCISION:       Predominant blood clots.       No diagnostic material identified.    10/01/2023 Pathology Results   LEFT ADRENAL GLAND, ADRENALECTOMY:  Metastatic clear cell renal cell carcinoma, nuclear grade 2   positive for pax8, ca-ix, negative for melan-A, inhibin and calretinin, the morphology and staining pattern support these cells are metastatic clear cell renal cell carcinoma.    10/14/2023 Cancer Staging   Staging form: Kidney, AJCC 8th Edition - Pathologic: Stage IV (pT1b, pN0, pM1) - Signed by Dakota Dakota BROCKS, MD on 10/14/2023 Histologic grade (G): G2 Histologic grading system: 4 grade system Residual tumor (R): R0   12/13/2023 Genetic Testing   Negative CancerNext-Expanded +RNAinsight, VUS in APC, p.E2463D (c.7389A>C). The CancerNext-Expanded gene panel offered by [facility] and includes sequencing, rearrangement, and RNA analysis for the following 77 genes: AIP, ALK, APC, ATM, AXIN2, BAP1, BARD1, BMPR1A, BRCA1, BRCA2, BRIP1, CDC73, CDH1, CDK4, CDKN1B, CDKN2A, CEBPA, CHEK2, CTNNA1, DDX41, DICER1, EGFR, EPCAM, ETV6, FH, FLCN, GATA2, GREM1, HOXB13, KIT, LZTR1, MAX, MBD4, MEN1, MET, MITF, MLH1, MSH2, MSH3, MSH6, MUTYH, NF1, NF2, NTHL1, PALB2, PDGFRA, PHOX2B, PMS2, POLD1, POLE, POT1, PRKAR1A, PTCH1, PTEN, RAD51C, RAD51D, RB1, RET, RPS20,  RUNX1, SDHA, SDHAF2, SDHB, SDHC, SDHD, SMAD4, SMARCA4, SMARCB1, SMARCE1, STK11, SUFU, TMEM127, TP53, TSC1, TSC2, VHL, WT1. Report date 12/13/23.       PHYSICAL EXAMINATION: ECOG PERFORMANCE STATUS: 0 - Asymptomatic  Vitals:   01/11/24 0835  BP: 105/75  Pulse: 67  Resp: 16  Temp: 97.7 F (36.5 C)  SpO2: 100%   Filed Weights   01/11/24 0835  Weight: 225 lb (102.1 kg)    GENERAL: alert, no distress and comfortable SKIN: skin color normal and no jaundice  OROPHARYNX: no exudate  NECK: No palpable mass LYMPH:  no palpable cervical, axillary lymphadenopathy  LUNGS: clear to auscultation and no wheeze or rales with normal breathing effort HEART: regular rate & rhythm  ABDOMEN: abdomen soft, non-tender and nondistended. Musculoskeletal: no edema NEURO: no focal motor/sensory deficits  Relevant data reviewed during this visit included labs.  New labs and imaging ordered.

## 2024-01-10 NOTE — Assessment & Plan Note (Addendum)
 10/01/23 resected adrenal mass Will continue long term monitoring, he also has lung nodules CT in 4 months CBC, CMP, LDH in ~ 4 months Report new changes

## 2024-01-11 ENCOUNTER — Inpatient Hospital Stay

## 2024-01-11 VITALS — BP 105/75 | HR 67 | Temp 97.7°F | Resp 16 | Ht 68.75 in | Wt 225.0 lb

## 2024-01-11 DIAGNOSIS — C641 Malignant neoplasm of right kidney, except renal pelvis: Secondary | ICD-10-CM | POA: Diagnosis present

## 2024-01-11 DIAGNOSIS — R918 Other nonspecific abnormal finding of lung field: Secondary | ICD-10-CM | POA: Diagnosis not present

## 2024-01-12 NOTE — Assessment & Plan Note (Signed)
 Repeat CT in about 4 months

## 2024-01-21 ENCOUNTER — Encounter: Payer: Self-pay | Admitting: Dermatology

## 2024-01-30 ENCOUNTER — Encounter: Payer: Self-pay | Admitting: Family Medicine

## 2024-02-09 ENCOUNTER — Other Ambulatory Visit: Payer: Self-pay | Admitting: Family Medicine

## 2024-02-14 NOTE — Telephone Encounter (Signed)
 Requested Prescriptions   Pending Prescriptions Disp Refills   cloNIDine  (CATAPRES ) 0.1 MG tablet [Pharmacy Med Name: cloNIDine  HCl 0.1 MG Oral Tablet] 180 tablet 3    Sig: TAKE 1 TABLET BY MOUTH TWICE  DAILY     Date of patient request: 02/14/2024 Last office visit: 12/24/2023 Upcoming visit: 06/28/2024 Pattricia of last refill: 09/29/2023 Last refill amount: 180

## 2024-02-29 ENCOUNTER — Encounter: Payer: Self-pay | Admitting: Genetic Counselor

## 2024-03-28 ENCOUNTER — Encounter: Payer: Self-pay | Admitting: Family Medicine

## 2024-03-28 MED ORDER — FLUTICASONE PROPIONATE 50 MCG/ACT NA SUSP: 2.0000 | Freq: Every day | NASAL | 3 refills | Status: DC

## 2024-04-04 MED ORDER — FLUTICASONE PROPIONATE 50 MCG/ACT NA SUSP: 2.0000 | Freq: Every day | NASAL | 3 refills | Status: AC

## 2024-04-04 NOTE — Addendum Note (Signed)
 Addended by: Kelin Borum A on: 04/04/2024 03:22 PM   Modules accepted: Orders

## 2024-05-09 ENCOUNTER — Inpatient Hospital Stay

## 2024-05-09 ENCOUNTER — Other Ambulatory Visit (HOSPITAL_COMMUNITY)

## 2024-05-16 ENCOUNTER — Inpatient Hospital Stay

## 2024-06-28 ENCOUNTER — Ambulatory Visit: Admitting: Family Medicine
